# Patient Record
Sex: Female | Born: 1945
Health system: Southern US, Community
[De-identification: ages and names within clinical notes are randomized; demographics above are authoritative.]

## PROBLEM LIST (undated history)

## (undated) DIAGNOSIS — I10 Essential (primary) hypertension: Secondary | ICD-10-CM

## (undated) DIAGNOSIS — H3530 Unspecified macular degeneration: Secondary | ICD-10-CM

## (undated) DIAGNOSIS — E039 Hypothyroidism, unspecified: Secondary | ICD-10-CM

## (undated) DIAGNOSIS — K2960 Other gastritis without bleeding: Secondary | ICD-10-CM

## (undated) DIAGNOSIS — R413 Other amnesia: Secondary | ICD-10-CM

## (undated) DIAGNOSIS — N200 Calculus of kidney: Secondary | ICD-10-CM

## (undated) DIAGNOSIS — M8580 Other specified disorders of bone density and structure, unspecified site: Secondary | ICD-10-CM

## (undated) DIAGNOSIS — K760 Fatty (change of) liver, not elsewhere classified: Secondary | ICD-10-CM

## (undated) DIAGNOSIS — D1771 Benign lipomatous neoplasm of kidney: Secondary | ICD-10-CM

## (undated) DIAGNOSIS — E079 Disorder of thyroid, unspecified: Secondary | ICD-10-CM

## (undated) HISTORY — PX: ABDOMINAL HYSTERECTOMY: SHX81

## (undated) HISTORY — PX: TOTAL ABDOMINAL HYSTERECTOMY W/ BILATERAL SALPINGOOPHORECTOMY: SHX83

## (undated) HISTORY — DX: Disorder of thyroid, unspecified: E07.9

## (undated) HISTORY — DX: Unspecified macular degeneration: H35.30

## (undated) HISTORY — DX: Hypothyroidism, unspecified: E03.9

## (undated) HISTORY — DX: Other gastritis without bleeding: K29.60

## (undated) HISTORY — DX: Fatty (change of) liver, not elsewhere classified: K76.0

## (undated) HISTORY — PX: DIAGNOSTIC LAPAROSCOPY: SUR761

## (undated) HISTORY — DX: Benign lipomatous neoplasm of kidney: D17.71

## (undated) HISTORY — DX: Essential (primary) hypertension: I10

## (undated) HISTORY — DX: Other specified disorders of bone density and structure, unspecified site: M85.80

---

## 2000-04-05 ENCOUNTER — Other Ambulatory Visit: Admission: RE | Admit: 2000-04-05 | Discharge: 2000-04-05 | Payer: Self-pay | Admitting: Obstetrics and Gynecology

## 2001-02-27 ENCOUNTER — Ambulatory Visit (HOSPITAL_COMMUNITY): Admission: RE | Admit: 2001-02-27 | Discharge: 2001-02-27 | Payer: Self-pay | Admitting: Family Medicine

## 2001-02-27 ENCOUNTER — Encounter: Payer: Self-pay | Admitting: Family Medicine

## 2001-04-03 ENCOUNTER — Ambulatory Visit (HOSPITAL_COMMUNITY): Admission: RE | Admit: 2001-04-03 | Discharge: 2001-04-03 | Payer: Self-pay | Admitting: Family Medicine

## 2001-04-03 ENCOUNTER — Encounter: Payer: Self-pay | Admitting: Family Medicine

## 2001-09-24 ENCOUNTER — Ambulatory Visit (HOSPITAL_COMMUNITY): Admission: RE | Admit: 2001-09-24 | Discharge: 2001-09-24 | Payer: Self-pay | Admitting: Internal Medicine

## 2001-12-13 ENCOUNTER — Ambulatory Visit (HOSPITAL_COMMUNITY): Admission: RE | Admit: 2001-12-13 | Discharge: 2001-12-13 | Payer: Self-pay | Admitting: Family Medicine

## 2001-12-13 ENCOUNTER — Encounter: Payer: Self-pay | Admitting: Family Medicine

## 2005-10-05 ENCOUNTER — Ambulatory Visit (HOSPITAL_COMMUNITY): Admission: RE | Admit: 2005-10-05 | Discharge: 2005-10-05 | Payer: Self-pay | Admitting: Family Medicine

## 2009-11-11 ENCOUNTER — Ambulatory Visit (HOSPITAL_COMMUNITY)
Admission: RE | Admit: 2009-11-11 | Discharge: 2009-11-11 | Payer: Self-pay | Source: Home / Self Care | Admitting: Family Medicine

## 2010-03-07 ENCOUNTER — Encounter: Payer: Self-pay | Admitting: Orthopedic Surgery

## 2010-03-07 ENCOUNTER — Emergency Department (HOSPITAL_COMMUNITY)
Admission: EM | Admit: 2010-03-07 | Discharge: 2010-03-07 | Payer: Self-pay | Source: Home / Self Care | Admitting: Emergency Medicine

## 2010-03-09 ENCOUNTER — Ambulatory Visit: Payer: Self-pay | Admitting: Orthopedic Surgery

## 2010-03-09 DIAGNOSIS — S8263XA Displaced fracture of lateral malleolus of unspecified fibula, initial encounter for closed fracture: Secondary | ICD-10-CM

## 2010-04-13 ENCOUNTER — Ambulatory Visit: Payer: Self-pay | Admitting: Orthopedic Surgery

## 2010-04-14 ENCOUNTER — Encounter: Payer: Self-pay | Admitting: Orthopedic Surgery

## 2010-05-13 ENCOUNTER — Ambulatory Visit
Admission: RE | Admit: 2010-05-13 | Discharge: 2010-05-13 | Payer: Self-pay | Source: Home / Self Care | Attending: Orthopedic Surgery | Admitting: Orthopedic Surgery

## 2010-06-03 ENCOUNTER — Ambulatory Visit (HOSPITAL_COMMUNITY)
Admission: RE | Admit: 2010-06-03 | Discharge: 2010-06-03 | Payer: Self-pay | Source: Home / Self Care | Attending: Family Medicine | Admitting: Family Medicine

## 2010-06-07 ENCOUNTER — Ambulatory Visit (HOSPITAL_COMMUNITY)
Admission: RE | Admit: 2010-06-07 | Discharge: 2010-06-07 | Payer: Self-pay | Source: Home / Self Care | Attending: Family Medicine | Admitting: Family Medicine

## 2010-06-08 NOTE — Letter (Signed)
Summary: Physician's order for walker  Physician's order for walker   Imported By: Jacklynn Ganong 04/16/2010 09:12:59  _____________________________________________________________________  External Attachment:    Type:   Image     Comment:   External Document

## 2010-06-08 NOTE — Assessment & Plan Note (Signed)
Summary: 1 M RE-CK/XRAY OOP/CAST CHANGE/MEDICARE,MUT OMAHA/CAF   Visit Type:  Follow-up Referring Provider:  ap er Primary Provider:  Dr. Lilyan Punt  CC:  fx care ankle.  History of Present Illness: I saw Susan Mcneil in the office today for a visit.  She is a 65 years old woman with the complaint of:  left ankle.  DOI 03/07/10   Xrays [facility] left ankle 03/07/10 for review.  Meds: Percocet 5 as needed  HPI: This is a 65 year old female, who is going out  to get her morning paper slipped on the grass. Her foot went from under her and she twisted and injured her LEFT ankle, sustaining a Weber B. type nondisplaced fibular fracture with an intact mortise. She does have pain, swelling, and painful weightbearing.  Today is 4 week recheck with xrays OOP.  No complaints.   Allergies: No Known Drug Allergies   Foot/Ankle Exam  Skin:    Intact with no scars, lesions, rashes, cafe-au-lait spots or bruising.    Inspection:    Inspection reveals no deformity, ecchymosis or swelling.   Palpation:    tenderness L-lateral malleoulus: MINIMAL   Vascular:    dorsalis pedis and posterior tibial pulses 2+    Sensory:    gross sensation intact bilaterally in lower extremities.    Ankle Exam:    Left:    Inspection/Palpation:  DF = 10  PF = 20    Impression & Recommendations:  Problem # 1:  CLOSED FRACTURE OF LATERAL MALLEOLUS (ICD-824.2) Assessment Improved  Orders: Post-Op Check (16109) Ankle x-ray complete,  minimum 3 views (73610) LATERAL MALLEOLAR FRATURE IS SPIRAL AND NON DISPLACED  IMPRESSION: HEALING ND LAT MALL FRACTURE   Patient Instructions: 1)  AIR CAST  2)  WEIGHT BEARING AS TOLERATED  3)  XRAYS IN 4 WEEKS    Orders Added: 1)  Post-Op Check [99024] 2)  Ankle x-ray complete,  minimum 3 views [60454]

## 2010-06-08 NOTE — Letter (Signed)
Summary: Work [facility]  [address]  [place], [place] [address]   Phone: [phone]  Fax: [phone]    Today's Date: March 09, 2010  Name of Patient: Susan Mcneil  The above named patient had a medical visit today at: 330 / pm.  Please take this into consideration when reviewing the time away from work/school.    To be out of work for 2 months    Sincerely yours,   Fuller Canada MD

## 2010-06-08 NOTE — Assessment & Plan Note (Signed)
Summary: [facility] FX LEFT ANKLE XR THERE/MCR/MUT OF OMA./BSF   Vital Signs:  Patient profile:   65 year old female Height:      64 inches Weight:      172 pounds Pulse rate:   80 / minute Resp:     18 per minute  Vitals Entered By: Fuller Canada MD (March 09, 2010 3:18 PM)  Visit Type:  new patient Referring Provider:  ap er Primary Provider:  Dr. Lilyan Punt  CC:  left ankle pain.  History of Present Illness: I saw Susan Mcneil in the office today for an initial visit.  She is a 65 years old woman with the complaint of:  left ankle.  DOI 03/07/10 slid on ice and fell twisting her left ankle.  Xrays [facility] left ankle 03/07/10 for review.  Crutches and splint today.  Meds: Percocet 5, number 30 given from er.   This is a 65 year old female, who is employed an a to get her morning paper his step on the grass. Her foot went from under her and she twisted and injured her LEFT ankle, sustaining a Weber B. type nondisplaced fibular fracture with an intact mortise. She does have pain, swelling, and painful weightbearing.     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 Cardiovascular:  Denies chest pain, palpitations, fainting, and murmurs. Respiratory:  Denies short of breath, wheezing, couch, tightness, pain on inspiration, and snoring . Gastrointestinal:  Denies heartburn, nausea, vomiting, diarrhea, constipation, and blood in your stools. Genitourinary:  Denies frequency, urgency, difficulty urinating, painful urination, flank pain, and bleeding in urine. Neurologic:   Complains of numbness and tingling; denies unsteady gait, dizziness, tremors, and seizure. Musculoskeletal:  Complains of swelling; denies joint pain, instability, stiffness, redness, heat, and muscle pain. Endocrine:  Denies excessive thirst, exessive urination, and heat or cold intolerance. Psychiatric:  Denies nervousness, depression, anxiety, and hallucinations. Skin:  Complains of itching; denies changes in the skin, poor healing, rash, and redness. HEENT:  Denies blurred or double vision, eye pain, redness, and watering. Immunology:  Denies seasonal allergies, sinus problems, and allergic to bee stings. Hemoatologic:  Denies easy bleeding and brusing.  Physical Exam  Additional Exam:  GEN: well developed, well nourished, normal grooming and hygiene, no deformity and normal body habitus.   CDV: pulses are normal, no edema, no erythema. no tenderness  Lymph: normal lymph nodes   Skin: no rashes, skin lesions or open sores   NEURO: normal coordination, reflexes, sensation.   Psyche: awake, alert and oriented. Mood normal   Gait: crutches   Her ankle is very swollen and tender over the fibula. The foot is swelling as well. The skin is somewhat ecchymotic. She has painful range of motion, but I can get her foot to the neutral position. Her muscle tone is normal. Her ankle is stable.   Impression & Recommendations:  Problem # 1:  CLOSED FRACTURE OF LATERAL MALLEOLUS (ICD-824.2) Assessment New  The x-rays were done at [facility]. The report and the films have been reviewed.  There is a non displaced fracture of  the lateral malleolus   REC cast treatment and xray f/u   Orders: New Patient Level III (95621) Lat. Malleolus Fx (30865)  Patient Instructions: 1)  CAST x 1 month then XR OOP, cast change  2)  Please do not get the cast wet. It will casue a severe skin reaction. If you do get it wet, dry it with a hairdryer on a low setting and call the office. [the cast will need to be  changed]   Orders Added: 1)  New Patient Level III [99203] 2)  Lat. Malleolus Fx [78469]

## 2010-06-10 NOTE — Assessment & Plan Note (Signed)
Summary: 4 WK RE-CK/XRAYS LT ANKLE/MEDICARE/MUT OF OM/CAF   Visit Type:  Follow-up Referring Provider:  ap er Primary Provider:  Dr. Lilyan Punt  CC:  left ankle pain.  History of Present Illness: I saw Susan Mcneil in the office today for a 4 week  followup visit.  She is a 65 years old woman with the complaint of:  Problem # 1:  CLOSED FRACTURE OF LATERAL MALLEOLUS (ICD-824.2)  DOI 03/07/10   Xrays today.  Meds: Percocet 5 as needed  Treatment: Cast, changed to aircast, medication.  X-rays are obtained today and show that the fracture is healed  Clinical exam shows no tenderness at the fracture site adequate range of motion and no discomfort with rotation  Patient is advised to wean himself off the brace and she would like to be seen for an appointment regarding her knee pain     Allergies: No Known Drug Allergies   Impression & Recommendations:  Problem # 1:  CLOSED FRACTURE OF LATERAL MALLEOLUS (ICD-824.2) Assessment Improved  separate identifiable x-ray report  3 views of the LEFT ankle  Previously noted fibular fracture has healed in good position with normal mortise alignment  Impression healed fibula fracture LEFT ankle  Orders: Post-Op Check (16109) Ankle x-ray complete,  minimum 3 views ([address])  Patient Instructions: 1)  Wean off brace  2)  set up appt for her knees    Orders Added: 1)  Post-Op Check [99024] 2)  Ankle x-ray complete,  minimum 3 views [09811]

## 2010-06-22 ENCOUNTER — Ambulatory Visit: Payer: Self-pay | Admitting: Orthopedic Surgery

## 2010-07-26 ENCOUNTER — Encounter: Payer: Self-pay | Admitting: Orthopedic Surgery

## 2010-07-28 ENCOUNTER — Ambulatory Visit (INDEPENDENT_AMBULATORY_CARE_PROVIDER_SITE_OTHER): Payer: Medicare Other | Admitting: Orthopedic Surgery

## 2010-07-28 ENCOUNTER — Encounter: Payer: Self-pay | Admitting: Orthopedic Surgery

## 2010-07-28 VITALS — HR 74 | Resp 16 | Ht 64.0 in | Wt 163.0 lb

## 2010-07-28 DIAGNOSIS — M1712 Unilateral primary osteoarthritis, left knee: Secondary | ICD-10-CM

## 2010-07-28 MED ORDER — METHYLPREDNISOLONE ACETATE 40 MG/ML IJ SUSP: 40.0000 mg | Freq: Once | INTRAMUSCULAR | Status: AC

## 2010-07-28 NOTE — Progress Notes (Signed)
Chief complaint is LEFT knee pain.  Her pain started approximately 3 years ago came on gradually associated with no trauma. She describes no aching pain associated with some burning and swelling in the knee, especially when she is ambulating. Pain level is 3/10 pain seems to come and go is worse with exercise and walking. She does take some Aleve p.r.n. Basis and gets some relief. She had an injection about 12 years ago and seem to help. No treatment since that time.   Review of systems, no weight loss, blurred vision, chest pain or shortness of breath, no frequency, changes in the skin, nervousness, easy bleeding, excessive thirst or seasonal allergies.  Complaint of heartburn, numbness, tingling, or musculoskeletal reviewed is in the medical record above.  General: The patient is normally developed, with normal grooming and hygiene. There are no gross deformities. The body habitus is normal   CDV: The pulse and perfusion of the extremities are normal   LYMPH: There is no gross lymphadenopathy in the extremities   Skin: There are no rashes, ulcers or cafe-au-lait spot   Psyche: The patient is alert, awake and oriented.  Mood is normal   Neuro:  The coordination and balance are normal.  Sensation is normal. Reflexes are 2+ and equal   Musculoskeletal

## 2010-07-28 NOTE — Patient Instructions (Signed)
You have received a steroid shot. 15% of patients experience increased pain at the injection site with in the next 24 hours. This is best treated with ice and tylenol extra strength 2 tabs every 8 hours. If you are still having pain please call the office.   No walking next 2 days   Next 2 weeks take aleve daily then go back to as needed

## 2010-09-24 NOTE — Op Note (Signed)
[facility]  Patient:    Susan Mcneil, Susan Mcneil Visit Number: [id number] MRN: [id number]          Service Type: END Location: DAY Attending Physician:  Jonathon Bellows Dictated by:   Roetta Sessions, M.D. Proc. Date: 09/24/01 Admit Date:  09/24/2001 Discharge Date: 09/24/2001   CC:         Lilyan Punt, M.D.   Operative Report  PROCEDURE:  Screening colonoscopy.  INDICATIONS FOR PROCEDURE:  The patient is a 65 year old lady with a positive family history of colorectal neoplasia. She had a sigmoidoscopy and air contrast barium enema previously which demonstrated diverticulosis and questionable polypoid lesions in the left colon. She is essentially asymptomatic from a GI standpoint. Colonoscopy is now being done as a high risk screening maneuver given her family history of colorectal neoplasia. This approach has been discussed with the patient at length previously and again at bedside. The potential risks, benefits, and alternatives have been reviewed, questions answered. She is agreeable. Please see my dictated H&P for more information.  MONITORING:  O2 saturations, blood pressure, pulse and respirations were monitored throughout the entire procedure.  CONSCIOUS SEDATION:  Versed 3 mg IV, Demerol 50 mg IV in divided doses.  INSTRUMENT:  Olympus video chip colonoscope.  FINDINGS:  Digital rectal exam revealed no abnormalities.  ENDOSCOPIC FINDINGS:  The prep was good.  RECTUM:  Examination of the rectal mucosal including a retroflexed view of the anal verge revealed a couple of anal papilla and internal hemorrhoids. Otherwise normal rectum.  COLON:  The colonic mucosa was surveyed from the rectosigmoid junction through the left transverse right colon to the area of the appendiceal orifice, ileocecal valve, and cecum. These structures were well seen and photographed for the record. The patient had scattered left sided diverticula. The remainder of  the colon mucosa appeared normal.  From the level of the cecum and ileocecal valve, the scope was slowly withdrawn. All previously mentioned mucosal surfaces were again seen and no other abnormalities were observed. The patient tolerated the procedure well and was reacted in endoscopy.  IMPRESSION:  1. Anal papilla and internal hemorrhoids, otherwise, normal rectum.  2. Sigmoid diverticula, remainder of colonic mucosa appeared normal.  RECOMMENDATIONS:  1. Diverticulosis literature provided to [name].  2. Repeat colonoscopy in five years. Dictated by:   Roetta Sessions, M.D. Attending Physician:  Jonathon Bellows DD:  09/24/01 TD:  09/25/01 Job: [id number]

## 2011-05-18 DIAGNOSIS — H353290 Exudative age-related macular degeneration, unspecified eye, stage unspecified: Secondary | ICD-10-CM | POA: Diagnosis not present

## 2011-05-18 DIAGNOSIS — H35059 Retinal neovascularization, unspecified, unspecified eye: Secondary | ICD-10-CM | POA: Diagnosis not present

## 2011-06-22 DIAGNOSIS — H35059 Retinal neovascularization, unspecified, unspecified eye: Secondary | ICD-10-CM | POA: Diagnosis not present

## 2011-06-22 DIAGNOSIS — H353290 Exudative age-related macular degeneration, unspecified eye, stage unspecified: Secondary | ICD-10-CM | POA: Diagnosis not present

## 2011-07-05 ENCOUNTER — Ambulatory Visit (HOSPITAL_COMMUNITY): Admission: RE | Admit: 2011-07-05 | Payer: Medicare Other | Source: Ambulatory Visit

## 2011-07-05 ENCOUNTER — Ambulatory Visit (HOSPITAL_COMMUNITY)
Admission: RE | Admit: 2011-07-05 | Discharge: 2011-07-05 | Disposition: A | Discharge status: Home / Self Care | Payer: Medicare Other | Source: Ambulatory Visit | Attending: Family Medicine | Admitting: Family Medicine

## 2011-07-05 ENCOUNTER — Other Ambulatory Visit: Payer: Self-pay | Admitting: Family Medicine

## 2011-07-05 DIAGNOSIS — R6889 Other general symptoms and signs: Secondary | ICD-10-CM | POA: Diagnosis not present

## 2011-07-05 DIAGNOSIS — I700 Atherosclerosis of aorta: Secondary | ICD-10-CM | POA: Diagnosis not present

## 2011-07-05 DIAGNOSIS — Z0000 Encounter for general adult medical examination without abnormal findings: Secondary | ICD-10-CM | POA: Diagnosis not present

## 2011-07-05 DIAGNOSIS — R222 Localized swelling, mass and lump, trunk: Secondary | ICD-10-CM | POA: Insufficient documentation

## 2011-07-06 DIAGNOSIS — E039 Hypothyroidism, unspecified: Secondary | ICD-10-CM | POA: Diagnosis not present

## 2011-07-06 DIAGNOSIS — Z0000 Encounter for general adult medical examination without abnormal findings: Secondary | ICD-10-CM | POA: Diagnosis not present

## 2011-07-06 DIAGNOSIS — Z79899 Other long term (current) drug therapy: Secondary | ICD-10-CM | POA: Diagnosis not present

## 2011-07-12 DIAGNOSIS — Z1231 Encounter for screening mammogram for malignant neoplasm of breast: Secondary | ICD-10-CM | POA: Diagnosis not present

## 2011-07-27 DIAGNOSIS — H35059 Retinal neovascularization, unspecified, unspecified eye: Secondary | ICD-10-CM | POA: Diagnosis not present

## 2011-07-27 DIAGNOSIS — H353290 Exudative age-related macular degeneration, unspecified eye, stage unspecified: Secondary | ICD-10-CM | POA: Diagnosis not present

## 2011-08-31 DIAGNOSIS — H35059 Retinal neovascularization, unspecified, unspecified eye: Secondary | ICD-10-CM | POA: Diagnosis not present

## 2011-08-31 DIAGNOSIS — H353290 Exudative age-related macular degeneration, unspecified eye, stage unspecified: Secondary | ICD-10-CM | POA: Diagnosis not present

## 2011-10-04 DIAGNOSIS — H353290 Exudative age-related macular degeneration, unspecified eye, stage unspecified: Secondary | ICD-10-CM | POA: Diagnosis not present

## 2011-10-04 DIAGNOSIS — H35059 Retinal neovascularization, unspecified, unspecified eye: Secondary | ICD-10-CM | POA: Diagnosis not present

## 2011-11-07 DIAGNOSIS — H353290 Exudative age-related macular degeneration, unspecified eye, stage unspecified: Secondary | ICD-10-CM | POA: Diagnosis not present

## 2011-11-07 DIAGNOSIS — H35059 Retinal neovascularization, unspecified, unspecified eye: Secondary | ICD-10-CM | POA: Diagnosis not present

## 2011-11-09 DIAGNOSIS — H35059 Retinal neovascularization, unspecified, unspecified eye: Secondary | ICD-10-CM | POA: Diagnosis not present

## 2011-11-09 DIAGNOSIS — H35359 Cystoid macular degeneration, unspecified eye: Secondary | ICD-10-CM | POA: Diagnosis not present

## 2011-11-09 DIAGNOSIS — H353290 Exudative age-related macular degeneration, unspecified eye, stage unspecified: Secondary | ICD-10-CM | POA: Diagnosis not present

## 2011-12-19 DIAGNOSIS — H353290 Exudative age-related macular degeneration, unspecified eye, stage unspecified: Secondary | ICD-10-CM | POA: Diagnosis not present

## 2011-12-21 DIAGNOSIS — H353290 Exudative age-related macular degeneration, unspecified eye, stage unspecified: Secondary | ICD-10-CM | POA: Diagnosis not present

## 2012-01-23 DIAGNOSIS — H35059 Retinal neovascularization, unspecified, unspecified eye: Secondary | ICD-10-CM | POA: Diagnosis not present

## 2012-01-23 DIAGNOSIS — H353290 Exudative age-related macular degeneration, unspecified eye, stage unspecified: Secondary | ICD-10-CM | POA: Diagnosis not present

## 2012-01-25 DIAGNOSIS — H353290 Exudative age-related macular degeneration, unspecified eye, stage unspecified: Secondary | ICD-10-CM | POA: Diagnosis not present

## 2012-01-25 DIAGNOSIS — H35059 Retinal neovascularization, unspecified, unspecified eye: Secondary | ICD-10-CM | POA: Diagnosis not present

## 2012-02-27 DIAGNOSIS — H353290 Exudative age-related macular degeneration, unspecified eye, stage unspecified: Secondary | ICD-10-CM | POA: Diagnosis not present

## 2012-02-27 DIAGNOSIS — H35059 Retinal neovascularization, unspecified, unspecified eye: Secondary | ICD-10-CM | POA: Diagnosis not present

## 2012-02-29 DIAGNOSIS — H35059 Retinal neovascularization, unspecified, unspecified eye: Secondary | ICD-10-CM | POA: Diagnosis not present

## 2012-02-29 DIAGNOSIS — H353290 Exudative age-related macular degeneration, unspecified eye, stage unspecified: Secondary | ICD-10-CM | POA: Diagnosis not present

## 2012-04-02 DIAGNOSIS — H353290 Exudative age-related macular degeneration, unspecified eye, stage unspecified: Secondary | ICD-10-CM | POA: Diagnosis not present

## 2012-04-02 DIAGNOSIS — H35059 Retinal neovascularization, unspecified, unspecified eye: Secondary | ICD-10-CM | POA: Diagnosis not present

## 2012-04-04 DIAGNOSIS — H35059 Retinal neovascularization, unspecified, unspecified eye: Secondary | ICD-10-CM | POA: Diagnosis not present

## 2012-04-04 DIAGNOSIS — H353290 Exudative age-related macular degeneration, unspecified eye, stage unspecified: Secondary | ICD-10-CM | POA: Diagnosis not present

## 2012-04-04 DIAGNOSIS — H35359 Cystoid macular degeneration, unspecified eye: Secondary | ICD-10-CM | POA: Diagnosis not present

## 2012-04-04 DIAGNOSIS — H3570 Unspecified separation of retinal layers: Secondary | ICD-10-CM | POA: Diagnosis not present

## 2012-05-07 DIAGNOSIS — H35059 Retinal neovascularization, unspecified, unspecified eye: Secondary | ICD-10-CM | POA: Diagnosis not present

## 2012-05-07 DIAGNOSIS — H353290 Exudative age-related macular degeneration, unspecified eye, stage unspecified: Secondary | ICD-10-CM | POA: Diagnosis not present

## 2012-05-08 DIAGNOSIS — H353290 Exudative age-related macular degeneration, unspecified eye, stage unspecified: Secondary | ICD-10-CM | POA: Diagnosis not present

## 2012-05-08 DIAGNOSIS — H35059 Retinal neovascularization, unspecified, unspecified eye: Secondary | ICD-10-CM | POA: Diagnosis not present

## 2012-06-12 DIAGNOSIS — H35059 Retinal neovascularization, unspecified, unspecified eye: Secondary | ICD-10-CM | POA: Diagnosis not present

## 2012-06-12 DIAGNOSIS — H353290 Exudative age-related macular degeneration, unspecified eye, stage unspecified: Secondary | ICD-10-CM | POA: Diagnosis not present

## 2012-06-27 DIAGNOSIS — H353290 Exudative age-related macular degeneration, unspecified eye, stage unspecified: Secondary | ICD-10-CM | POA: Diagnosis not present

## 2012-06-27 DIAGNOSIS — H35359 Cystoid macular degeneration, unspecified eye: Secondary | ICD-10-CM | POA: Diagnosis not present

## 2012-06-28 DIAGNOSIS — K5732 Diverticulitis of large intestine without perforation or abscess without bleeding: Secondary | ICD-10-CM | POA: Diagnosis not present

## 2012-07-02 DIAGNOSIS — K5732 Diverticulitis of large intestine without perforation or abscess without bleeding: Secondary | ICD-10-CM | POA: Diagnosis not present

## 2012-07-03 DIAGNOSIS — E782 Mixed hyperlipidemia: Secondary | ICD-10-CM | POA: Diagnosis not present

## 2012-07-03 DIAGNOSIS — Z79899 Other long term (current) drug therapy: Secondary | ICD-10-CM | POA: Diagnosis not present

## 2012-07-03 DIAGNOSIS — E039 Hypothyroidism, unspecified: Secondary | ICD-10-CM | POA: Diagnosis not present

## 2012-07-06 ENCOUNTER — Other Ambulatory Visit (HOSPITAL_COMMUNITY): Payer: Self-pay | Admitting: Family Medicine

## 2012-07-06 DIAGNOSIS — M8580 Other specified disorders of bone density and structure, unspecified site: Secondary | ICD-10-CM

## 2012-07-06 DIAGNOSIS — Z9189 Other specified personal risk factors, not elsewhere classified: Secondary | ICD-10-CM | POA: Diagnosis not present

## 2012-07-06 DIAGNOSIS — Z0000 Encounter for general adult medical examination without abnormal findings: Secondary | ICD-10-CM | POA: Diagnosis not present

## 2012-07-11 ENCOUNTER — Ambulatory Visit (HOSPITAL_COMMUNITY)
Admission: RE | Admit: 2012-07-11 | Discharge: 2012-07-11 | Disposition: A | Discharge status: Home / Self Care | Payer: Medicare Other | Source: Ambulatory Visit | Attending: Family Medicine | Admitting: Family Medicine

## 2012-07-11 DIAGNOSIS — M899 Disorder of bone, unspecified: Secondary | ICD-10-CM | POA: Diagnosis not present

## 2012-07-11 DIAGNOSIS — Z780 Asymptomatic menopausal state: Secondary | ICD-10-CM | POA: Insufficient documentation

## 2012-07-11 DIAGNOSIS — M949 Disorder of cartilage, unspecified: Secondary | ICD-10-CM | POA: Insufficient documentation

## 2012-07-11 DIAGNOSIS — M8580 Other specified disorders of bone density and structure, unspecified site: Secondary | ICD-10-CM

## 2012-07-12 DIAGNOSIS — Z1231 Encounter for screening mammogram for malignant neoplasm of breast: Secondary | ICD-10-CM | POA: Diagnosis not present

## 2012-07-12 DIAGNOSIS — Z803 Family history of malignant neoplasm of breast: Secondary | ICD-10-CM | POA: Diagnosis not present

## 2012-07-14 ENCOUNTER — Encounter: Payer: Self-pay | Admitting: *Deleted

## 2012-08-01 DIAGNOSIS — H353290 Exudative age-related macular degeneration, unspecified eye, stage unspecified: Secondary | ICD-10-CM | POA: Diagnosis not present

## 2012-08-01 DIAGNOSIS — H35059 Retinal neovascularization, unspecified, unspecified eye: Secondary | ICD-10-CM | POA: Diagnosis not present

## 2012-08-08 DIAGNOSIS — H353290 Exudative age-related macular degeneration, unspecified eye, stage unspecified: Secondary | ICD-10-CM | POA: Diagnosis not present

## 2012-08-08 DIAGNOSIS — H35359 Cystoid macular degeneration, unspecified eye: Secondary | ICD-10-CM | POA: Diagnosis not present

## 2012-09-05 DIAGNOSIS — H35059 Retinal neovascularization, unspecified, unspecified eye: Secondary | ICD-10-CM | POA: Diagnosis not present

## 2012-09-05 DIAGNOSIS — H35359 Cystoid macular degeneration, unspecified eye: Secondary | ICD-10-CM | POA: Diagnosis not present

## 2012-09-05 DIAGNOSIS — H353290 Exudative age-related macular degeneration, unspecified eye, stage unspecified: Secondary | ICD-10-CM | POA: Diagnosis not present

## 2012-09-19 DIAGNOSIS — H35059 Retinal neovascularization, unspecified, unspecified eye: Secondary | ICD-10-CM | POA: Diagnosis not present

## 2012-09-19 DIAGNOSIS — H353290 Exudative age-related macular degeneration, unspecified eye, stage unspecified: Secondary | ICD-10-CM | POA: Diagnosis not present

## 2012-10-17 DIAGNOSIS — H35059 Retinal neovascularization, unspecified, unspecified eye: Secondary | ICD-10-CM | POA: Diagnosis not present

## 2012-10-17 DIAGNOSIS — H353290 Exudative age-related macular degeneration, unspecified eye, stage unspecified: Secondary | ICD-10-CM | POA: Diagnosis not present

## 2012-10-25 DIAGNOSIS — H353290 Exudative age-related macular degeneration, unspecified eye, stage unspecified: Secondary | ICD-10-CM | POA: Diagnosis not present

## 2012-10-25 DIAGNOSIS — H40009 Preglaucoma, unspecified, unspecified eye: Secondary | ICD-10-CM | POA: Diagnosis not present

## 2012-10-25 DIAGNOSIS — H40019 Open angle with borderline findings, low risk, unspecified eye: Secondary | ICD-10-CM | POA: Diagnosis not present

## 2012-10-25 DIAGNOSIS — H04129 Dry eye syndrome of unspecified lacrimal gland: Secondary | ICD-10-CM | POA: Diagnosis not present

## 2012-10-31 DIAGNOSIS — H35359 Cystoid macular degeneration, unspecified eye: Secondary | ICD-10-CM | POA: Diagnosis not present

## 2012-10-31 DIAGNOSIS — H35059 Retinal neovascularization, unspecified, unspecified eye: Secondary | ICD-10-CM | POA: Diagnosis not present

## 2012-10-31 DIAGNOSIS — H353290 Exudative age-related macular degeneration, unspecified eye, stage unspecified: Secondary | ICD-10-CM | POA: Diagnosis not present

## 2012-11-21 DIAGNOSIS — H353290 Exudative age-related macular degeneration, unspecified eye, stage unspecified: Secondary | ICD-10-CM | POA: Diagnosis not present

## 2012-11-21 DIAGNOSIS — H35059 Retinal neovascularization, unspecified, unspecified eye: Secondary | ICD-10-CM | POA: Diagnosis not present

## 2012-12-05 DIAGNOSIS — H35059 Retinal neovascularization, unspecified, unspecified eye: Secondary | ICD-10-CM | POA: Diagnosis not present

## 2012-12-05 DIAGNOSIS — H353290 Exudative age-related macular degeneration, unspecified eye, stage unspecified: Secondary | ICD-10-CM | POA: Diagnosis not present

## 2012-12-26 DIAGNOSIS — H35059 Retinal neovascularization, unspecified, unspecified eye: Secondary | ICD-10-CM | POA: Diagnosis not present

## 2012-12-26 DIAGNOSIS — H35359 Cystoid macular degeneration, unspecified eye: Secondary | ICD-10-CM | POA: Diagnosis not present

## 2012-12-26 DIAGNOSIS — H353290 Exudative age-related macular degeneration, unspecified eye, stage unspecified: Secondary | ICD-10-CM | POA: Diagnosis not present

## 2012-12-31 ENCOUNTER — Encounter: Payer: Self-pay | Admitting: Family Medicine

## 2012-12-31 ENCOUNTER — Ambulatory Visit (INDEPENDENT_AMBULATORY_CARE_PROVIDER_SITE_OTHER): Payer: Medicare Other | Admitting: Family Medicine

## 2012-12-31 VITALS — BP 142/94 | Ht 64.0 in | Wt 176.4 lb

## 2012-12-31 DIAGNOSIS — E785 Hyperlipidemia, unspecified: Secondary | ICD-10-CM

## 2012-12-31 DIAGNOSIS — R748 Abnormal levels of other serum enzymes: Secondary | ICD-10-CM

## 2012-12-31 DIAGNOSIS — E039 Hypothyroidism, unspecified: Secondary | ICD-10-CM | POA: Diagnosis not present

## 2012-12-31 NOTE — Progress Notes (Signed)
  Subjective:    Patient ID: Susan Mcneil, female    DOB: [birth year], 67 y.o.   MRN: [id number]  HPI Here for f/u appt from lab work. Believes cholesterol levels were high.  No other concerns. Patient denies any chest tightness pressure pain shortness of breath. She does try to eat healthy she does not do as much walking as she should she had been doing some bicycle riding She denies any wheezing headaches. She does not smoke.  Review of Systems See above.    Objective:   Physical Exam Lungs are clear hearts regular pulse normal abdomen soft extremities no edema skin warm dry       Assessment & Plan:  May need additional labs and u/s-if her liver enzymes are elevated she may need further workup. Lip/liv/tsh-she will do the lab work in may need adjustment of medication depending on the results patient followup again in 6 months

## 2013-01-01 DIAGNOSIS — E039 Hypothyroidism, unspecified: Secondary | ICD-10-CM | POA: Diagnosis not present

## 2013-01-01 DIAGNOSIS — E785 Hyperlipidemia, unspecified: Secondary | ICD-10-CM | POA: Diagnosis not present

## 2013-01-01 LAB — HEPATIC FUNCTION PANEL
AST: 25 U/L (ref 0–37)
Albumin: 4.4 g/dL (ref 3.5–5.2)
Alkaline Phosphatase: 70 U/L (ref 39–117)
Indirect Bilirubin: 0.5 mg/dL (ref 0.0–0.9)
Total Bilirubin: 0.6 mg/dL (ref 0.3–1.2)
Total Protein: 7.1 g/dL (ref 6.0–8.3)

## 2013-01-01 LAB — LIPID PANEL
HDL: 71 mg/dL (ref >39)
Triglycerides: 142 mg/dL (ref <150)

## 2013-01-02 ENCOUNTER — Other Ambulatory Visit: Payer: Self-pay

## 2013-01-02 MED ORDER — PRAVASTATIN SODIUM 80 MG PO TABS: 80.0000 mg | ORAL_TABLET | Freq: Every day | ORAL | Status: DC

## 2013-01-16 DIAGNOSIS — H35359 Cystoid macular degeneration, unspecified eye: Secondary | ICD-10-CM | POA: Diagnosis not present

## 2013-01-16 DIAGNOSIS — H35059 Retinal neovascularization, unspecified, unspecified eye: Secondary | ICD-10-CM | POA: Diagnosis not present

## 2013-01-16 DIAGNOSIS — H353290 Exudative age-related macular degeneration, unspecified eye, stage unspecified: Secondary | ICD-10-CM | POA: Diagnosis not present

## 2013-02-07 DIAGNOSIS — H353290 Exudative age-related macular degeneration, unspecified eye, stage unspecified: Secondary | ICD-10-CM | POA: Diagnosis not present

## 2013-02-07 DIAGNOSIS — H35059 Retinal neovascularization, unspecified, unspecified eye: Secondary | ICD-10-CM | POA: Diagnosis not present

## 2013-02-20 DIAGNOSIS — Z23 Encounter for immunization: Secondary | ICD-10-CM | POA: Diagnosis not present

## 2013-02-27 DIAGNOSIS — H353290 Exudative age-related macular degeneration, unspecified eye, stage unspecified: Secondary | ICD-10-CM | POA: Diagnosis not present

## 2013-02-27 DIAGNOSIS — H35059 Retinal neovascularization, unspecified, unspecified eye: Secondary | ICD-10-CM | POA: Diagnosis not present

## 2013-03-13 ENCOUNTER — Ambulatory Visit (INDEPENDENT_AMBULATORY_CARE_PROVIDER_SITE_OTHER): Payer: Medicare Other | Admitting: Family Medicine

## 2013-03-13 ENCOUNTER — Encounter: Payer: Self-pay | Admitting: Family Medicine

## 2013-03-13 VITALS — BP 130/82 | Ht 64.0 in | Wt 179.4 lb

## 2013-03-13 DIAGNOSIS — H35059 Retinal neovascularization, unspecified, unspecified eye: Secondary | ICD-10-CM | POA: Diagnosis not present

## 2013-03-13 DIAGNOSIS — Z0000 Encounter for general adult medical examination without abnormal findings: Secondary | ICD-10-CM | POA: Diagnosis not present

## 2013-03-13 DIAGNOSIS — R109 Unspecified abdominal pain: Secondary | ICD-10-CM | POA: Diagnosis not present

## 2013-03-13 DIAGNOSIS — H353290 Exudative age-related macular degeneration, unspecified eye, stage unspecified: Secondary | ICD-10-CM | POA: Diagnosis not present

## 2013-03-13 MED ORDER — METRONIDAZOLE 500 MG PO TABS: 500.0000 mg | ORAL_TABLET | Freq: Three times a day (TID) | ORAL | Status: AC

## 2013-03-13 MED ORDER — CIPROFLOXACIN HCL 500 MG PO TABS: 500.0000 mg | ORAL_TABLET | Freq: Two times a day (BID) | ORAL | Status: DC

## 2013-03-13 NOTE — Progress Notes (Signed)
  Subjective:    Patient ID: Susan Mcneil, female    DOB: 1945/07/26, 67 y.o.   MRN: [id number]  HPI  Patient with complaint of left abd pain for few days-history of diverticulitis. Patient has history of diverticulitis she complains of left lower abdominal pain discomfort been present for the past couple days no vomiting with it no sweats chills nausea vomiting diarrhea wheezing. No dysuria. PMH benign. Family history noncontributory Review of Systems See above. Patient is up-to-date on colonoscopy. Also patient had a CAT scan several years ago that showed multiple diverticuli in the sigmoid colon   denies fevers denies sweats denies cough wheezing difficulty breathing no arthralgias Objective:   Physical Exam  Her lungs are clear hearts regular abdomen is soft with mild tenderness in the left lower abdomen no guarding or rebound extremities no edema      Assessment & Plan:  Significant left lower quadrant abdominal pain-differential was gone through. More than likely diverticulitis. Cipro twice a day for 10 days Flagyl 3 times a day for 10 days probiotic as well progressive symptoms such as vomiting severe pain rectal bleeding or other issues need to followup immediately or call us may need CT scan Hemoccult cards x3 should be done in 2-3 weeks and sent back to [place]. I don't feel this patient needs colonoscopy currently. High complexity decision making regarding possibility of a CAT scan versus ER versus outpatient management

## 2013-03-20 ENCOUNTER — Other Ambulatory Visit (INDEPENDENT_AMBULATORY_CARE_PROVIDER_SITE_OTHER): Payer: Medicare Other | Admitting: *Deleted

## 2013-03-20 DIAGNOSIS — R109 Unspecified abdominal pain: Secondary | ICD-10-CM

## 2013-03-20 DIAGNOSIS — Z1211 Encounter for screening for malignant neoplasm of colon: Secondary | ICD-10-CM

## 2013-03-20 DIAGNOSIS — Z0000 Encounter for general adult medical examination without abnormal findings: Secondary | ICD-10-CM

## 2013-03-20 LAB — POC HEMOCCULT BLD/STL (HOME/3-CARD/SCREEN)
Card #3 Fecal Occult Blood, POC: NEGATIVE
Fecal Occult Blood, POC: NEGATIVE

## 2013-03-21 ENCOUNTER — Encounter: Payer: Self-pay | Admitting: Family Medicine

## 2013-04-10 DIAGNOSIS — H35059 Retinal neovascularization, unspecified, unspecified eye: Secondary | ICD-10-CM | POA: Diagnosis not present

## 2013-04-10 DIAGNOSIS — H353290 Exudative age-related macular degeneration, unspecified eye, stage unspecified: Secondary | ICD-10-CM | POA: Diagnosis not present

## 2013-04-17 DIAGNOSIS — H35059 Retinal neovascularization, unspecified, unspecified eye: Secondary | ICD-10-CM | POA: Diagnosis not present

## 2013-04-17 DIAGNOSIS — H35359 Cystoid macular degeneration, unspecified eye: Secondary | ICD-10-CM | POA: Diagnosis not present

## 2013-04-17 DIAGNOSIS — H353290 Exudative age-related macular degeneration, unspecified eye, stage unspecified: Secondary | ICD-10-CM | POA: Diagnosis not present

## 2013-05-01 ENCOUNTER — Ambulatory Visit (INDEPENDENT_AMBULATORY_CARE_PROVIDER_SITE_OTHER): Payer: Medicare Other | Admitting: Family Medicine

## 2013-05-01 ENCOUNTER — Encounter: Payer: Self-pay | Admitting: Family Medicine

## 2013-05-01 VITALS — BP 130/82 | Ht 64.0 in | Wt 180.0 lb

## 2013-05-01 DIAGNOSIS — R748 Abnormal levels of other serum enzymes: Secondary | ICD-10-CM

## 2013-05-01 DIAGNOSIS — R413 Other amnesia: Secondary | ICD-10-CM

## 2013-05-01 DIAGNOSIS — R4182 Altered mental status, unspecified: Secondary | ICD-10-CM | POA: Diagnosis not present

## 2013-05-01 DIAGNOSIS — E785 Hyperlipidemia, unspecified: Secondary | ICD-10-CM

## 2013-05-01 DIAGNOSIS — E039 Hypothyroidism, unspecified: Secondary | ICD-10-CM

## 2013-05-01 DIAGNOSIS — I881 Chronic lymphadenitis, except mesenteric: Secondary | ICD-10-CM

## 2013-05-01 NOTE — Progress Notes (Signed)
   Subjective:    Patient ID: Susan Mcneil, female    DOB: January 22, 1946, 67 y.o.   MRN: [id number]  HPI Patient arrives with forgetfulness at times since Thanksgiving. Started and OTC med that helps with memory but it hasn't helped. Forgets to do things at times. Oriented X 3. First occurrence of this issue. On Dec 7 had altered mental state for 1 minute. She relates that for about a minute she felt like she couldn't think properly everything seemed disoriented to her plus also she didn't feel right she even felt like there might be moving slightly but she denies vertigo. She denies any unilateral numbness or weakness. She's not had any spells of aphasia of unilateral numbness or weakness. Over past few weeks with memory issues. Some spells of misplacing items. Occasionally repeating herself. Some forgeting of things told to her No family issues. No stresses. No stroke symtoms other than what is above.   Review of Systems She denies dizziness nausea vomiting headache. Denies chest pain shortness of breath.    Objective:   Physical Exam  Vitals reviewed. Constitutional: She appears well-nourished. No distress.  HENT:  Eardrums normal throat is normal neck is supple no masses patient's pupils are equal EOMI facial nerves normal bilateral.  Cardiovascular: Normal rate, regular rhythm and normal heart sounds.   No murmur heard. Pulmonary/Chest: Effort normal and breath sounds normal. No respiratory distress.  Musculoskeletal: She exhibits no edema.  Lymphadenopathy:    She has no cervical adenopathy.  Neurological: She is alert. She exhibits normal muscle tone.  Finger to nose normal negative Romberg. Patient also passed many cognitive tests. Also scored 28/30 of the Mini-Mental Status exam.  Psychiatric: Her behavior is normal.          Assessment & Plan:  Altered mental status-with this brief spell that she had it is concerning that she may have had a TIA. I am concerned with this  patient's sudden onset of memory issues over the past month and a half if there could be the residuals of a stroke. I believe it would be wise for this patient to undergo carotid Doppler studies and MRI. May need neurologic consultation depending on the findings. Also stroke factors and risk modification such as blood pressure cholesterol was discussed start 81 mg aspirin daily until work up completed followup again in one month's time 30 minutes spent with patient

## 2013-05-10 ENCOUNTER — Ambulatory Visit (HOSPITAL_COMMUNITY)
Admission: RE | Admit: 2013-05-10 | Discharge: 2013-05-10 | Disposition: A | Discharge status: Home / Self Care | Payer: Medicare Other | Source: Ambulatory Visit | Attending: Family Medicine | Admitting: Family Medicine

## 2013-05-10 DIAGNOSIS — G454 Transient global amnesia: Secondary | ICD-10-CM | POA: Insufficient documentation

## 2013-05-10 DIAGNOSIS — I10 Essential (primary) hypertension: Secondary | ICD-10-CM | POA: Insufficient documentation

## 2013-05-10 DIAGNOSIS — R413 Other amnesia: Secondary | ICD-10-CM | POA: Diagnosis not present

## 2013-05-10 DIAGNOSIS — I6789 Other cerebrovascular disease: Secondary | ICD-10-CM | POA: Insufficient documentation

## 2013-05-10 DIAGNOSIS — R4182 Altered mental status, unspecified: Secondary | ICD-10-CM

## 2013-05-10 DIAGNOSIS — G319 Degenerative disease of nervous system, unspecified: Secondary | ICD-10-CM | POA: Insufficient documentation

## 2013-05-14 ENCOUNTER — Other Ambulatory Visit: Payer: Self-pay | Admitting: Family Medicine

## 2013-05-14 DIAGNOSIS — E785 Hyperlipidemia, unspecified: Secondary | ICD-10-CM | POA: Diagnosis not present

## 2013-05-14 DIAGNOSIS — R748 Abnormal levels of other serum enzymes: Secondary | ICD-10-CM | POA: Diagnosis not present

## 2013-05-14 DIAGNOSIS — E039 Hypothyroidism, unspecified: Secondary | ICD-10-CM | POA: Diagnosis not present

## 2013-05-14 DIAGNOSIS — R413 Other amnesia: Secondary | ICD-10-CM

## 2013-05-14 NOTE — Addendum Note (Signed)
Addended byCharolotte Capuchin D on: 05/14/2013 05:22 PM   Modules accepted: Orders

## 2013-05-14 NOTE — Progress Notes (Signed)
BW orders in and ready for her to pick up

## 2013-05-16 ENCOUNTER — Other Ambulatory Visit: Payer: Self-pay | Admitting: Family Medicine

## 2013-05-16 DIAGNOSIS — R4182 Altered mental status, unspecified: Secondary | ICD-10-CM | POA: Diagnosis not present

## 2013-05-16 DIAGNOSIS — D539 Nutritional anemia, unspecified: Secondary | ICD-10-CM | POA: Diagnosis not present

## 2013-05-16 LAB — HEPATIC FUNCTION PANEL
ALK PHOS: 74 U/L (ref 39–117)
ALT: 20 U/L (ref 0–35)
AST: 24 U/L (ref 0–37)
Albumin: 4.1 g/dL (ref 3.5–5.2)
BILIRUBIN INDIRECT: 0.4 mg/dL (ref 0.0–0.9)
Bilirubin, Direct: 0.1 mg/dL (ref 0.0–0.3)
TOTAL PROTEIN: 6.9 g/dL (ref 6.0–8.3)
Total Bilirubin: 0.5 mg/dL (ref 0.3–1.2)

## 2013-05-16 LAB — LIPID PANEL
Cholesterol: 206 mg/dL — ABNORMAL HIGH (ref 0–200)
HDL: 61 mg/dL (ref >39)
LDL Cholesterol: 120 mg/dL — ABNORMAL HIGH (ref 0–99)
TRIGLYCERIDES: 124 mg/dL (ref <150)
Total CHOL/HDL Ratio: 3.4 Ratio
VLDL: 25 mg/dL (ref 0–40)

## 2013-05-17 LAB — VITAMIN B12: Vitamin B-12: 256 pg/mL (ref 211–911)

## 2013-05-17 LAB — T4, FREE: FREE T4: 1.34 ng/dL (ref 0.80–1.80)

## 2013-05-17 LAB — TSH: TSH: 4.222 u[IU]/mL (ref 0.350–4.500)

## 2013-05-17 NOTE — Progress Notes (Signed)
Patient notified and verbalized understanding of results. (Added lab work.)

## 2013-05-21 ENCOUNTER — Encounter: Payer: Self-pay | Admitting: Family Medicine

## 2013-05-21 LAB — METHYLMALONIC ACID, SERUM: Methylmalonic Acid, Quant: 0.29 umol/L (ref <0.40)

## 2013-06-04 ENCOUNTER — Encounter: Payer: Self-pay | Admitting: Family Medicine

## 2013-06-04 ENCOUNTER — Ambulatory Visit (INDEPENDENT_AMBULATORY_CARE_PROVIDER_SITE_OTHER): Payer: Medicare Other | Admitting: Family Medicine

## 2013-06-04 VITALS — BP 132/80 | Ht 64.0 in | Wt 182.8 lb

## 2013-06-04 DIAGNOSIS — E039 Hypothyroidism, unspecified: Secondary | ICD-10-CM | POA: Diagnosis not present

## 2013-06-04 DIAGNOSIS — E785 Hyperlipidemia, unspecified: Secondary | ICD-10-CM

## 2013-06-04 DIAGNOSIS — R413 Other amnesia: Secondary | ICD-10-CM | POA: Diagnosis not present

## 2013-06-04 MED ORDER — TRIAMCINOLONE ACETONIDE 0.1 % EX CREA: 1.0000 "application " | TOPICAL_CREAM | Freq: Two times a day (BID) | CUTANEOUS | Status: DC

## 2013-06-04 NOTE — Progress Notes (Signed)
   Subjective:    Patient ID: Susan Mcneil, female    DOB: [birth year], 68 y.o.   MRN: [id number]  HPI  Patient arrives for a follow up on her memory. Patient states the issue is about the same and has not gotten worse. Patient has not had any unusual memory issues recently she's seen a neurologist in early February. She is also hopeful that things will go well.  She does take her cholesterol medicine she does try to watch her diet. She does not need medication adjustment or change in therapy at this present moment we will need to recheck lab work later this spring/early summer.  Patient's recent testing shows thyroid function good continue it where it is  Patient's B12 level is low normal she is to take an additional supplement. Her methylmalonic acid level is normal so therefore she doesn't have true B12 deficiency. More than likely we will repeat B12 level in several months time  Patient also reports rash on legs for 3 days. She does relate a papular rash on her legs does not itch just started up a few days ago no new medicines or soaps.  Review of Systems Denies fever chills vomiting cough. States memory is stable.    Objective:   Physical Exam Lungs are clear hearts regular Papular rash noted on the legs Patient alert and interactive        Assessment & Plan:  #1 nonspecific rash possibly related to dry skin use lotion on a regular basis Kenalog cream twice a day when necessary followup if ongoing troubles  #2 hypothyroidism on current dose stick with where it is.  #3 low normal B12 take B12 supplement as directed  #4 memory issues will be seeing neurologist in February.  Patient will followup here in approximate 6 months  Continue statin I doubt that is causing her memory issue

## 2013-06-05 DIAGNOSIS — H35059 Retinal neovascularization, unspecified, unspecified eye: Secondary | ICD-10-CM | POA: Diagnosis not present

## 2013-06-05 DIAGNOSIS — H353290 Exudative age-related macular degeneration, unspecified eye, stage unspecified: Secondary | ICD-10-CM | POA: Diagnosis not present

## 2013-06-07 DIAGNOSIS — H40009 Preglaucoma, unspecified, unspecified eye: Secondary | ICD-10-CM | POA: Diagnosis not present

## 2013-06-10 DIAGNOSIS — H353290 Exudative age-related macular degeneration, unspecified eye, stage unspecified: Secondary | ICD-10-CM | POA: Diagnosis not present

## 2013-06-10 DIAGNOSIS — H35059 Retinal neovascularization, unspecified, unspecified eye: Secondary | ICD-10-CM | POA: Diagnosis not present

## 2013-06-10 DIAGNOSIS — H35359 Cystoid macular degeneration, unspecified eye: Secondary | ICD-10-CM | POA: Diagnosis not present

## 2013-06-17 ENCOUNTER — Other Ambulatory Visit (HOSPITAL_COMMUNITY): Payer: Self-pay | Admitting: Pediatrics

## 2013-06-17 ENCOUNTER — Encounter: Payer: Self-pay | Admitting: Neurology

## 2013-06-17 ENCOUNTER — Ambulatory Visit (INDEPENDENT_AMBULATORY_CARE_PROVIDER_SITE_OTHER): Payer: Medicare Other | Admitting: Neurology

## 2013-06-17 VITALS — BP 140/82 | HR 70 | Temp 98.4°F | Resp 18 | Ht 64.0 in | Wt 179.7 lb

## 2013-06-17 DIAGNOSIS — R413 Other amnesia: Secondary | ICD-10-CM | POA: Diagnosis not present

## 2013-06-17 DIAGNOSIS — R404 Transient alteration of awareness: Secondary | ICD-10-CM | POA: Diagnosis not present

## 2013-06-17 NOTE — Patient Instructions (Addendum)
It is unclear what exactly happened in December.  Maybe it was a transient ischemic attack, which is a mini-stroke. 1.  Start aspirin 81mg  daily. 2.  We will get an EEG, which records the brain waves looking for evidence of increased risk for seizures. 3.  Continue B12 4.  Follow up in 6 months for re-evaluation of memory.  You are scheduled at [facility] on February 19, at 230 pm for your EEG, please arrive at 215 pm for registration. Please go to main entrance at [facility] [address].

## 2013-06-17 NOTE — Progress Notes (Signed)
NEUROLOGY CONSULTATION NOTE  Susan Mcneil MRN: [id number] DOB: 1945-10-11  Referring provider: Dr. Wolfgang Phoenix Primary care provider: Dr. Wolfgang Phoenix  Reason for consult:  Transient altered sensorium  HISTORY OF PRESENT ILLNESS: Susan Mcneil is a 67 year old right-handed woman with history of hypothyroidism and hyperlipidemia who presents for memory problems.  She is accompanied by her daughter.  Records and images were personally reviewed where available.    On 04/14/13, she had a brief episode of altered sensorium.  She was at a family holiday party.  She was standing, when suddenly she felt strange, "like an out of body experience."  It lasted maybe a couple of minutes.  She did not lose consciousness, feel dizzy, have a headache, or experience focal twitching or numbness.  The episode quickly passed, unbeknownst to anybody else.  She has never had an episode like that before or since.  She has been at baseline every since.  She also has some memory issues, but nothing significant or anything worrisome to the patient or her family.  She will sometimes forget some content of conversation she had with others.  She does not misplace items.  She drives without difficulty.  Her daughter feels safe in the car when she is driving.  She pays bills without any problems.    Her B12 level was in the low-normal range and she has started oral supplementation.    She has no history of seizures, migraine or stroke.  There is no family history of dementia.  05/16/13 LABS:  B12 258, methylmalonic acid 0.29, TSH 4.222, Chol 206, HDL 61, LDL 120  05/14/13 MRI BRAIN WO:  mild to moderate cerebral and cerebellar atrophy with chronic small vessel disease.  Moderately advanced dolichoectasia of intracranial vasculature, prominently involving left vertebral.  05/10/13 CAROTID US:  no hemodynamically significant ICA stenosis.  Both vertebrals with normal flow.  PAST MEDICAL HISTORY: Past Medical History  Diagnosis Date    . HTN (hypertension)     cholesterol & thyroid  . Macular degeneration   . Reflux gastritis   . Thyroid disorder   . Osteoporosis   . Hypothyroidism   . Fatty liver   . Osteopenia     PAST SURGICAL HISTORY: Past Surgical History  Procedure Laterality Date  . Total abdominal hysterectomy w/ bilateral salpingoophorectomy    . Diagnostic laparoscopy    . Abdominal hysterectomy   patial, pre-uterine cancer, 1974    MEDICATIONS: Current Outpatient Prescriptions on File Prior to Visit  Medication Sig Dispense Refill  . Calcium Citrate (CITRACAL PO) Take 2 tablets by mouth daily.      . Coenzyme Q10 (COQ10 PO) Take by mouth daily.      Marland Kitchen levothyroxine (SYNTHROID, LEVOTHROID) 100 MCG tablet Take 100 mcg by mouth daily.        . Multiple Vitamins-Minerals ([facility] MACULAR HEALTH PO) Take 1 tablet by mouth daily after lunch.      . pravastatin (PRAVACHOL) 80 MG tablet Take 1 tablet (80 mg total) by mouth daily.  30 tablet  6  . triamcinolone cream (KENALOG) 0.1 % Apply 1 application topically 2 (two) times daily.  45 g  4   No current facility-administered medications on file prior to visit.    ALLERGIES: No Known Allergies  FAMILY HISTORY: Family History  Problem Relation Age of Onset  . Cancer Mother   . Stroke Father     SOCIAL HISTORY: History   Social History  . Marital Status: Married  Spouse Name: N/A    Number of Children: N/A  . Years of Education: 12   Occupational History  . retired   .     Social History Main Topics  . Smoking status: Former Smoker    Quit date: 12/07/2000  . Smokeless tobacco: Not on file  . Alcohol Use: No  . Drug Use: Not on file  . Sexual Activity: Not on file   Other Topics Concern  . Not on file   Social History Narrative  . No narrative on file    REVIEW OF SYSTEMS: Constitutional: No fevers, chills, or sweats, no generalized fatigue, change in appetite Eyes: No visual changes, double vision, eye pain Ear, nose and  throat: No hearing loss, ear pain, nasal congestion, sore throat Cardiovascular: No chest pain, palpitations Respiratory:  No shortness of breath at rest or with exertion, wheezes GastrointestinaI: No nausea, vomiting, diarrhea, abdominal pain, fecal incontinence Genitourinary:  No dysuria, urinary retention or frequency Musculoskeletal:  No neck pain, back pain Integumentary: rash Neurological: as above Psychiatric: No depression, insomnia, anxiety Endocrine: No palpitations, fatigue, diaphoresis, mood swings, change in appetite, change in weight, increased thirst Hematologic/Lymphatic:  No anemia, purpura, petechiae. Allergic/Immunologic: no itchy/runny eyes, nasal congestion, recent allergic reactions, rashes  PHYSICAL EXAM: Filed Vitals:   06/17/13 1257  BP: 140/82  Pulse: 70  Temp: 98.4 F (36.9 C)  Resp: 18   General: No acute distress.  She feels a little anxious Head:  Normocephalic/atraumatic Neck: supple, no paraspinal tenderness, full range of motion Back: No paraspinal tenderness Heart: regular rate and rhythm Lungs: Clear to auscultation bilaterally. Vascular: No carotid bruits. Neurological Exam: Mental status: alert and oriented to person, place, and time, speech fluent and not dysarthric, language intact.  Able to spell WORLD backwards.  Recalled 1 of 3 words after a couple of minutes.  Able to follow 3 step command across midline.  Able to copy intersecting pentagons.  Able to draw a clock except unable to draw the hands to the requested time.  MMSE 28/30. Cranial nerves: CN I: not tested CN II: pupils equal, round and reactive to light, visual fields intact, fundi unremarkable. CN III, IV, VI:  full range of motion, no nystagmus, no ptosis CN V: facial sensation intact CN VII: upper and lower face symmetric CN VIII: hearing intact CN IX, X: gag intact, uvula midline CN XI: sternocleidomastoid and trapezius muscles intact CN XII: tongue midline Bulk & Tone:  normal, no fasciculations. Motor: 5/5 throughout Sensation: temperature and vibration intact. Deep Tendon Reflexes: 2+ throughout, toes down Finger to nose testing: no dysmetria or tremor. Heel to shin: No dysmetria Gait: normal stance and stride.  Able to walk in tandem. Romberg negative.  IMPRESSION: 1.  Transient altered sensorium.  Etiology unclear.  Differential includes TIA, seizure or migraine.  She does not have migraine.  Possibly TIA, although it is not localizable. 2.  Mild short-term memory problems.  Not really a concern to patient or daughter.  Had some difficulty with delayed recall and did not correctly place the hands on the clock to requested time.  May be related to anxiety.  Would monitor.  PLAN: 1.  I would start ASA 81mg  daily  2.  We will check an EEG 3.  Continue B12 supplementation 4.  Follow up in 6 months for re-evaluation of memory.  45 minutes spent with patient, over 50% spent counseling and coordinating care.  Thank you for allowing me to take part in the care  of this patient.  Metta Clines, DO  CC:  Sallee Lange, MD

## 2013-06-27 ENCOUNTER — Ambulatory Visit (HOSPITAL_COMMUNITY)
Admission: RE | Admit: 2013-06-27 | Discharge: 2013-06-27 | Disposition: A | Discharge status: Home / Self Care | Payer: Medicare Other | Source: Ambulatory Visit | Attending: Neurology | Admitting: Neurology

## 2013-06-27 DIAGNOSIS — R413 Other amnesia: Secondary | ICD-10-CM | POA: Diagnosis not present

## 2013-06-27 DIAGNOSIS — R4182 Altered mental status, unspecified: Secondary | ICD-10-CM | POA: Insufficient documentation

## 2013-06-27 DIAGNOSIS — R404 Transient alteration of awareness: Secondary | ICD-10-CM

## 2013-06-27 NOTE — Progress Notes (Signed)
OP routine adult EEG completed.

## 2013-07-05 ENCOUNTER — Encounter: Payer: Self-pay | Admitting: Family Medicine

## 2013-07-05 ENCOUNTER — Ambulatory Visit (INDEPENDENT_AMBULATORY_CARE_PROVIDER_SITE_OTHER): Payer: Medicare Other | Admitting: Family Medicine

## 2013-07-05 VITALS — BP 128/80 | Ht 64.0 in | Wt 180.8 lb

## 2013-07-05 DIAGNOSIS — R21 Rash and other nonspecific skin eruption: Secondary | ICD-10-CM

## 2013-07-05 LAB — CBC WITH DIFFERENTIAL/PLATELET
Basophils Absolute: 0 10*3/uL (ref 0.0–0.1)
Basophils Relative: 0 % (ref 0–1)
EOS ABS: 0.2 10*3/uL (ref 0.0–0.7)
Eosinophils Relative: 2 % (ref 0–5)
HCT: 42.5 % (ref 36.0–46.0)
Hemoglobin: 14.7 g/dL (ref 12.0–15.0)
Lymphocytes Relative: 38 % (ref 12–46)
Lymphs Abs: 3 10*3/uL (ref 0.7–4.0)
MCH: 30 pg (ref 26.0–34.0)
MCHC: 34.6 g/dL (ref 30.0–36.0)
MCV: 86.7 fL (ref 78.0–100.0)
MONOS PCT: 7 % (ref 3–12)
Monocytes Absolute: 0.6 10*3/uL (ref 0.1–1.0)
NEUTROS ABS: 4.2 10*3/uL (ref 1.7–7.7)
Neutrophils Relative %: 53 % (ref 43–77)
Platelets: 258 10*3/uL (ref 150–400)
RBC: 4.9 MIL/uL (ref 3.87–5.11)
RDW: 14.6 % (ref 11.5–15.5)
WBC: 8 10*3/uL (ref 4.0–10.5)

## 2013-07-05 NOTE — Progress Notes (Signed)
   Subjective:    Patient ID: Susan Mcneil, female    DOB: [birth year], 68 y.o.   MRN: [id number]  HPI Patient arrives for a rash on both legs for a few days. Patient reports nothing has changed and nothing is new. This rash started off as small red dots on the lower leg and progressively moved up the leg to about the level of her knee. She also relates that the rash is also in the arms.  She denies any myalgias she denies fever sweats chills wheezing difficulty breathing she states no hives. No new foods no new medicines or supplements no prior history of this  Review of Systems No fevers no hives no itching no sweats chills or weight loss    Objective:   Physical Exam Nonspecific rash does not blanch small red spots slight papular nature more on the legs some on the arms none on the abdomen or back lungs are clear hearts regular       Assessment & Plan:  Nonspecific rash could be drug related rash stop pravastatin if not doing better over the next 2 weeks notify us and we will work with getting dermatology opinion  If it goes away with stopping the pravastatin and she'll need to be on a different cholesterol medicine  She is following up with neurology in 6 months still awaiting EEG results

## 2013-07-05 NOTE — Patient Instructions (Signed)
Continue B12 take 2000 mcg daily  Recheck B12 in 5 to 6 months  Stop Pravastatin , please let me know if rash doesn't go away over the next 2 weeks, if rash causes other issues let us know  Also is rash doesn't go away over the next 2 weeks then we will need to get Derm involved  Also if rash goes away we will need to try different cholesterol med, please give [place] rash update ( call ) in about 2 weeks

## 2013-07-06 LAB — SEDIMENTATION RATE: Sed Rate: 10 mm/hr (ref 0–22)

## 2013-07-08 ENCOUNTER — Encounter: Payer: Self-pay | Admitting: Family Medicine

## 2013-07-08 LAB — ANA: Anti Nuclear Antibody(ANA): NEGATIVE

## 2013-07-09 ENCOUNTER — Other Ambulatory Visit: Payer: Self-pay | Admitting: Family Medicine

## 2013-07-10 DIAGNOSIS — H353290 Exudative age-related macular degeneration, unspecified eye, stage unspecified: Secondary | ICD-10-CM | POA: Diagnosis not present

## 2013-07-10 DIAGNOSIS — H35059 Retinal neovascularization, unspecified, unspecified eye: Secondary | ICD-10-CM | POA: Diagnosis not present

## 2013-07-19 ENCOUNTER — Telehealth: Payer: Self-pay | Admitting: Family Medicine

## 2013-07-19 ENCOUNTER — Other Ambulatory Visit: Payer: Self-pay | Admitting: Family Medicine

## 2013-07-19 DIAGNOSIS — R21 Rash and other nonspecific skin eruption: Secondary | ICD-10-CM

## 2013-07-19 NOTE — Telephone Encounter (Signed)
Please do a referral for Dermatologist.  Still has rash and you told her if it was still present after 2 weeks to let you know to do referral. Her contact number is [phone].

## 2013-07-19 NOTE — Telephone Encounter (Signed)
Notified patient referral was put in today, brendale to do. Patient verbalized understanding.

## 2013-07-19 NOTE — Telephone Encounter (Signed)
Referral was put in today, brendale to do and notify pt, NTC with this message

## 2013-07-22 NOTE — Addendum Note (Signed)
Encounter addended by: Dudley Major, DO on: 07/22/2013 11:00 AM<BR>     Documentation filed: Clinical Notes

## 2013-07-22 NOTE — Procedures (Signed)
ELECTROENCEPHALOGRAM REPORT  Date of Study: 06/27/2013  Patient's Name: TEXAS OBORN MRN: [id number] Date of Birth: [birth year]  Indication: Altered sensorium ("out of body" experience)  Medications: Coenzyme Q10, levothyroxine  Technical Summary: This is a multichannel digital EEG recording, using the international 10-20 placement system.  Spike detection software was employed.  Description: The EEG background is symmetric, with a well-developed posterior dominant rhythm of 10 Hz, which is reactive to eye opening and closing.  Diffuse beta activity is seen, with a bilateral frontal preponderance.  No focal or generalized abnormalities are seen.  No focal or generalized epileptiform discharges are seen.  Stage II sleep is not seen.  Hyperventilation and photic stimulation were performed, and produced no abnormalities.  ECG revealed normal cardiac rate and rhythm.  Impression: This is a normal routine EEG of the awake state, with activating procedures.  A normal study does not rule out the possibility of a seizure disorder in this patient.  Heli Dino R. Tomi Likens, DO

## 2013-07-24 ENCOUNTER — Telehealth: Payer: Self-pay | Admitting: *Deleted

## 2013-07-24 NOTE — Telephone Encounter (Signed)
Called patient at mobile # [phone] this number is no longer working , I also tried home number no answer did not leave a voicemail

## 2013-07-26 DIAGNOSIS — H40009 Preglaucoma, unspecified, unspecified eye: Secondary | ICD-10-CM | POA: Diagnosis not present

## 2013-08-12 DIAGNOSIS — H35359 Cystoid macular degeneration, unspecified eye: Secondary | ICD-10-CM | POA: Diagnosis not present

## 2013-08-12 DIAGNOSIS — H35059 Retinal neovascularization, unspecified, unspecified eye: Secondary | ICD-10-CM | POA: Diagnosis not present

## 2013-08-12 DIAGNOSIS — H353290 Exudative age-related macular degeneration, unspecified eye, stage unspecified: Secondary | ICD-10-CM | POA: Diagnosis not present

## 2013-08-14 DIAGNOSIS — H353290 Exudative age-related macular degeneration, unspecified eye, stage unspecified: Secondary | ICD-10-CM | POA: Diagnosis not present

## 2013-08-14 DIAGNOSIS — H35059 Retinal neovascularization, unspecified, unspecified eye: Secondary | ICD-10-CM | POA: Diagnosis not present

## 2013-08-14 DIAGNOSIS — H35359 Cystoid macular degeneration, unspecified eye: Secondary | ICD-10-CM | POA: Diagnosis not present

## 2013-08-21 DIAGNOSIS — L259 Unspecified contact dermatitis, unspecified cause: Secondary | ICD-10-CM | POA: Diagnosis not present

## 2013-08-21 DIAGNOSIS — D235 Other benign neoplasm of skin of trunk: Secondary | ICD-10-CM | POA: Diagnosis not present

## 2013-08-21 DIAGNOSIS — I781 Nevus, non-neoplastic: Secondary | ICD-10-CM | POA: Diagnosis not present

## 2013-09-19 DIAGNOSIS — H35059 Retinal neovascularization, unspecified, unspecified eye: Secondary | ICD-10-CM | POA: Diagnosis not present

## 2013-09-19 DIAGNOSIS — H353290 Exudative age-related macular degeneration, unspecified eye, stage unspecified: Secondary | ICD-10-CM | POA: Diagnosis not present

## 2013-10-09 DIAGNOSIS — H353290 Exudative age-related macular degeneration, unspecified eye, stage unspecified: Secondary | ICD-10-CM | POA: Diagnosis not present

## 2013-10-09 DIAGNOSIS — H35059 Retinal neovascularization, unspecified, unspecified eye: Secondary | ICD-10-CM | POA: Diagnosis not present

## 2013-10-24 ENCOUNTER — Telehealth: Payer: Self-pay | Admitting: Family Medicine

## 2013-10-24 DIAGNOSIS — E538 Deficiency of other specified B group vitamins: Secondary | ICD-10-CM

## 2013-10-24 DIAGNOSIS — E782 Mixed hyperlipidemia: Secondary | ICD-10-CM

## 2013-10-24 DIAGNOSIS — Z79899 Other long term (current) drug therapy: Secondary | ICD-10-CM

## 2013-10-24 DIAGNOSIS — E039 Hypothyroidism, unspecified: Secondary | ICD-10-CM

## 2013-10-24 MED ORDER — LEVOTHYROXINE SODIUM 100 MCG PO TABS: ORAL_TABLET | ORAL | Status: DC

## 2013-10-24 NOTE — Telephone Encounter (Signed)
Had these labs on 05/14/13: Liver, Lipid, T4 free, TSH, B12, and Methylmalonic acid

## 2013-10-24 NOTE — Telephone Encounter (Signed)
bloodwork orders ready. Pt notified.  

## 2013-10-24 NOTE — Telephone Encounter (Signed)
Is patient due for bloodwork? She has an appointment for November 20, 2013. Also, she needs a refill on her levothyroxine.     [facility]

## 2013-10-24 NOTE — Telephone Encounter (Signed)
Lipid, liver, metabolic 7, TSH, [id number] hypothyroidism and B12 deficiency

## 2013-10-25 LAB — BASIC METABOLIC PANEL
BUN: 17 mg/dL (ref 6–23)
CALCIUM: 9.5 mg/dL (ref 8.4–10.5)
CO2: 26 mEq/L (ref 19–32)
CREATININE: 0.74 mg/dL (ref 0.50–1.10)
Chloride: 107 mEq/L (ref 96–112)
GLUCOSE: 87 mg/dL (ref 70–99)
Potassium: 4.1 mEq/L (ref 3.5–5.3)
Sodium: 142 mEq/L (ref 135–145)

## 2013-10-25 LAB — HEPATIC FUNCTION PANEL
ALK PHOS: 78 U/L (ref 39–117)
ALT: 28 U/L (ref 0–35)
AST: 27 U/L (ref 0–37)
Albumin: 4.2 g/dL (ref 3.5–5.2)
BILIRUBIN DIRECT: 0.1 mg/dL (ref 0.0–0.3)
BILIRUBIN INDIRECT: 0.5 mg/dL (ref 0.2–1.2)
Total Bilirubin: 0.6 mg/dL (ref 0.2–1.2)
Total Protein: 6.9 g/dL (ref 6.0–8.3)

## 2013-10-25 LAB — LIPID PANEL
CHOL/HDL RATIO: 3.6 ratio
CHOLESTEROL: 217 mg/dL — AB (ref 0–200)
HDL: 60 mg/dL (ref >39)
LDL Cholesterol: 135 mg/dL — ABNORMAL HIGH (ref 0–99)
Triglycerides: 112 mg/dL (ref <150)
VLDL: 22 mg/dL (ref 0–40)

## 2013-10-25 LAB — TSH: TSH: 4.173 u[IU]/mL (ref 0.350–4.500)

## 2013-10-25 LAB — VITAMIN B12: Vitamin B-12: 430 pg/mL (ref 211–911)

## 2013-10-29 DIAGNOSIS — Z1231 Encounter for screening mammogram for malignant neoplasm of breast: Secondary | ICD-10-CM | POA: Diagnosis not present

## 2013-10-31 DIAGNOSIS — H35059 Retinal neovascularization, unspecified, unspecified eye: Secondary | ICD-10-CM | POA: Diagnosis not present

## 2013-10-31 DIAGNOSIS — H353290 Exudative age-related macular degeneration, unspecified eye, stage unspecified: Secondary | ICD-10-CM | POA: Diagnosis not present

## 2013-11-19 ENCOUNTER — Encounter: Payer: Self-pay | Admitting: Family Medicine

## 2013-11-20 ENCOUNTER — Encounter: Payer: Self-pay | Admitting: Family Medicine

## 2013-11-20 ENCOUNTER — Ambulatory Visit (INDEPENDENT_AMBULATORY_CARE_PROVIDER_SITE_OTHER): Payer: Medicare Other | Admitting: Family Medicine

## 2013-11-20 VITALS — BP 130/82 | Ht 64.0 in | Wt 178.0 lb

## 2013-11-20 DIAGNOSIS — E785 Hyperlipidemia, unspecified: Secondary | ICD-10-CM | POA: Diagnosis not present

## 2013-11-20 DIAGNOSIS — Z23 Encounter for immunization: Secondary | ICD-10-CM

## 2013-11-20 DIAGNOSIS — E038 Other specified hypothyroidism: Secondary | ICD-10-CM | POA: Diagnosis not present

## 2013-11-20 MED ORDER — LEVOTHYROXINE SODIUM 112 MCG PO TABS: 112.0000 ug | ORAL_TABLET | Freq: Every day | ORAL | Status: DC

## 2013-11-20 MED ORDER — ROSUVASTATIN CALCIUM 10 MG PO TABS: 10.0000 mg | ORAL_TABLET | Freq: Every day | ORAL | Status: DC

## 2013-11-20 NOTE — Progress Notes (Signed)
   Subjective:    Patient ID: Susan Mcneil, female    DOB: 11-09-1945, 68 y.o.   MRN: [id number]  Hyperlipidemia This is a chronic problem. The current episode started more than 1 year ago. Exacerbating diseases include hypothyroidism. Pertinent negatives include no chest pain or shortness of breath. Current antihyperlipidemic treatment includes statins. There are no compliance problems.  Risk factors for coronary artery disease include post-menopausal.   Healthy diet Walking for exercise  Patient also would like to follow up on memory issue from December- patient has seen Neurologist and he cleared her. Patient feels her memory is doing well now. Her MRI did show some microvascular changes certainly want to do everything we can to prevent a stroke by keeping cholesterol and blood pressure under good control Review of Systems  Constitutional: Negative for fever, activity change, appetite change and fatigue.  HENT: Positive for congestion and rhinorrhea. Negative for ear pain.   Eyes: Negative for discharge.  Respiratory: Positive for cough. Negative for chest tightness, shortness of breath and wheezing.   Cardiovascular: Negative for chest pain and palpitations.  Gastrointestinal: Negative for abdominal pain.  Endocrine: Negative for polydipsia and polyphagia.  Genitourinary: Negative for frequency.  Neurological: Negative for weakness.  Psychiatric/Behavioral: Negative for confusion.       Objective:   Physical Exam  Vitals reviewed. Constitutional: She appears well-nourished. No distress.  Cardiovascular: Normal rate, regular rhythm and normal heart sounds.   No murmur heard. Pulmonary/Chest: Effort normal and breath sounds normal. No respiratory distress.  Musculoskeletal: She exhibits no edema.  Lymphadenopathy:    She has no cervical adenopathy.  Neurological: She is alert. She exhibits normal muscle tone.  Psychiatric: Her behavior is normal.          Assessment &  Plan:  #1 hyperlipidemia-her current medications no longer doing what needs to recommend Crestor 10 mg daily recheck lipid liver profile in approximately 8 weeks goal is to get LDL below 100  #2 hypothyroidism increase the dose of medicine to try to get TSH 0.5 and 2.5. Watch diet closely  Pneumococcal vaccine today  25 minutes spent with patient discussing all these issues.

## 2013-12-05 DIAGNOSIS — H353290 Exudative age-related macular degeneration, unspecified eye, stage unspecified: Secondary | ICD-10-CM | POA: Diagnosis not present

## 2013-12-05 DIAGNOSIS — H35059 Retinal neovascularization, unspecified, unspecified eye: Secondary | ICD-10-CM | POA: Diagnosis not present

## 2013-12-16 ENCOUNTER — Ambulatory Visit: Payer: Medicare Other | Admitting: Neurology

## 2013-12-18 DIAGNOSIS — H353290 Exudative age-related macular degeneration, unspecified eye, stage unspecified: Secondary | ICD-10-CM | POA: Diagnosis not present

## 2013-12-18 DIAGNOSIS — H35059 Retinal neovascularization, unspecified, unspecified eye: Secondary | ICD-10-CM | POA: Diagnosis not present

## 2014-01-09 DIAGNOSIS — H353290 Exudative age-related macular degeneration, unspecified eye, stage unspecified: Secondary | ICD-10-CM | POA: Diagnosis not present

## 2014-01-09 DIAGNOSIS — H35059 Retinal neovascularization, unspecified, unspecified eye: Secondary | ICD-10-CM | POA: Diagnosis not present

## 2014-01-09 DIAGNOSIS — H35359 Cystoid macular degeneration, unspecified eye: Secondary | ICD-10-CM | POA: Diagnosis not present

## 2014-02-12 DIAGNOSIS — Z23 Encounter for immunization: Secondary | ICD-10-CM | POA: Diagnosis not present

## 2014-02-13 DIAGNOSIS — H3532 Exudative age-related macular degeneration: Secondary | ICD-10-CM | POA: Diagnosis not present

## 2014-03-19 DIAGNOSIS — H3532 Exudative age-related macular degeneration: Secondary | ICD-10-CM | POA: Diagnosis not present

## 2014-03-19 DIAGNOSIS — H35052 Retinal neovascularization, unspecified, left eye: Secondary | ICD-10-CM | POA: Diagnosis not present

## 2014-03-24 DIAGNOSIS — H35052 Retinal neovascularization, unspecified, left eye: Secondary | ICD-10-CM | POA: Diagnosis not present

## 2014-03-24 DIAGNOSIS — H3532 Exudative age-related macular degeneration: Secondary | ICD-10-CM | POA: Diagnosis not present

## 2014-04-09 DIAGNOSIS — H18899 Other specified disorders of cornea, unspecified eye: Secondary | ICD-10-CM | POA: Diagnosis not present

## 2014-04-09 DIAGNOSIS — H40009 Preglaucoma, unspecified, unspecified eye: Secondary | ICD-10-CM | POA: Diagnosis not present

## 2014-04-09 DIAGNOSIS — H40019 Open angle with borderline findings, low risk, unspecified eye: Secondary | ICD-10-CM | POA: Diagnosis not present

## 2014-04-24 DIAGNOSIS — H3532 Exudative age-related macular degeneration: Secondary | ICD-10-CM | POA: Diagnosis not present

## 2014-04-24 DIAGNOSIS — H35051 Retinal neovascularization, unspecified, right eye: Secondary | ICD-10-CM | POA: Diagnosis not present

## 2014-06-04 DIAGNOSIS — H3532 Exudative age-related macular degeneration: Secondary | ICD-10-CM | POA: Diagnosis not present

## 2014-06-04 DIAGNOSIS — H35051 Retinal neovascularization, unspecified, right eye: Secondary | ICD-10-CM | POA: Diagnosis not present

## 2014-06-10 ENCOUNTER — Telehealth: Payer: Self-pay | Admitting: Family Medicine

## 2014-06-10 DIAGNOSIS — Z131 Encounter for screening for diabetes mellitus: Secondary | ICD-10-CM

## 2014-06-10 DIAGNOSIS — Z79899 Other long term (current) drug therapy: Secondary | ICD-10-CM

## 2014-06-10 DIAGNOSIS — E785 Hyperlipidemia, unspecified: Secondary | ICD-10-CM

## 2014-06-10 DIAGNOSIS — E039 Hypothyroidism, unspecified: Secondary | ICD-10-CM

## 2014-06-10 NOTE — Telephone Encounter (Signed)
10/24/13: lip, liv, met7, tsh, b12

## 2014-06-10 NOTE — Telephone Encounter (Signed)
Does patient need order for BW for upcoming med check?

## 2014-06-10 NOTE — Telephone Encounter (Signed)
tsh lipid liver glucose- hyperlipid, hypothyroid,screening diabetes

## 2014-06-10 NOTE — Telephone Encounter (Signed)
Patient notified and verbalized understanding. 

## 2014-06-23 ENCOUNTER — Other Ambulatory Visit: Payer: Self-pay | Admitting: Family Medicine

## 2014-06-23 ENCOUNTER — Ambulatory Visit: Payer: Medicare Other | Admitting: Family Medicine

## 2014-06-25 DIAGNOSIS — H3532 Exudative age-related macular degeneration: Secondary | ICD-10-CM | POA: Diagnosis not present

## 2014-06-26 ENCOUNTER — Other Ambulatory Visit (INDEPENDENT_AMBULATORY_CARE_PROVIDER_SITE_OTHER): Payer: Self-pay | Admitting: General Surgery

## 2014-07-01 ENCOUNTER — Encounter: Payer: Self-pay | Admitting: Family Medicine

## 2014-07-01 ENCOUNTER — Ambulatory Visit (INDEPENDENT_AMBULATORY_CARE_PROVIDER_SITE_OTHER): Payer: Medicare Other | Admitting: Family Medicine

## 2014-07-01 VITALS — BP 132/92 | Ht 64.0 in | Wt 180.0 lb

## 2014-07-01 DIAGNOSIS — E039 Hypothyroidism, unspecified: Secondary | ICD-10-CM | POA: Diagnosis not present

## 2014-07-01 DIAGNOSIS — E785 Hyperlipidemia, unspecified: Secondary | ICD-10-CM | POA: Diagnosis not present

## 2014-07-01 DIAGNOSIS — Z139 Encounter for screening, unspecified: Secondary | ICD-10-CM | POA: Diagnosis not present

## 2014-07-01 DIAGNOSIS — IMO0001 Reserved for inherently not codable concepts without codable children: Secondary | ICD-10-CM

## 2014-07-01 DIAGNOSIS — R030 Elevated blood-pressure reading, without diagnosis of hypertension: Secondary | ICD-10-CM

## 2014-07-01 DIAGNOSIS — H3530 Unspecified macular degeneration: Secondary | ICD-10-CM | POA: Diagnosis not present

## 2014-07-01 MED ORDER — LEVOTHYROXINE SODIUM 112 MCG PO TABS: ORAL_TABLET | ORAL | Status: DC

## 2014-07-01 MED ORDER — ROSUVASTATIN CALCIUM 10 MG PO TABS: 10.0000 mg | ORAL_TABLET | Freq: Every day | ORAL | Status: DC

## 2014-07-01 NOTE — Progress Notes (Signed)
   Subjective:    Patient ID: Susan Mcneil, female    DOB: 01-May-1946, 69 y.o.   MRN: [id number]  Hyperlipidemia This is a chronic problem. The current episode started more than 1 year ago. Treatments tried: crestor. There are no compliance problems.   Exercises at home on treadmill and bike.   Pt states no concerns today.  Pt has not yet had bloodwork done. She states she went to [facility] and they were closed.  Review of Systems Denies chest tightness pressure pain shortness breath nausea vomiting diarrhea denies rectal pain. Denies rectal bleeding    Objective:   Physical Exam Lungs clear heart trigger pulse normal extremities no edema skin warm dry blood pressure checked twice       Assessment & Plan:  #1 hyperlipidemia continue medication check lab work await the results Hypothyroidism continue medication check lab work await the results Hyperglycemia history check glucose await the results. Elevated blood pressure I am concerned about her possibility of developing hypertension she is a watch her diet she is exercise on a regular basis and follow-up within 3 months if blood pressure still elevated will start medication  Patient being followed by specialist for macular degeneration

## 2014-07-01 NOTE — Patient Instructions (Addendum)
DASH Eating Plan DASH stands for "Dietary Approaches to Stop Hypertension." The DASH eating plan is a healthy eating plan that has been shown to reduce high blood pressure (hypertension). Additional health benefits may include reducing the risk of type 2 diabetes mellitus, heart disease, and stroke. The DASH eating plan may also help with weight loss. WHAT DO I NEED TO KNOW ABOUT THE DASH EATING PLAN? For the DASH eating plan, you will follow these general guidelines:  Choose foods with a percent daily value for sodium of less than 5% (as listed on the food label).  Use salt-free seasonings or herbs instead of table salt or sea salt.  Check with your health care provider or pharmacist before using salt substitutes.  Eat lower-sodium products, often labeled as "lower sodium" or "no salt added."  Eat fresh foods.  Eat more vegetables, fruits, and low-fat dairy products.  Choose whole grains. Look for the word "whole" as the first word in the ingredient list.  Choose fish and skinless chicken or turkey more often than red meat. Limit fish, poultry, and meat to 6 oz (170 g) each day.  Limit sweets, desserts, sugars, and sugary drinks.  Choose heart-healthy fats.  Limit cheese to 1 oz (28 g) per day.  Eat more home-cooked food and less restaurant, buffet, and fast food.  Limit fried foods.  Cook foods using methods other than frying.  Limit canned vegetables. If you do use them, rinse them well to decrease the sodium.  When eating at a restaurant, ask that your food be prepared with less salt, or no salt if possible. WHAT FOODS CAN I EAT? Seek help from a dietitian for individual calorie needs. Grains Whole grain or whole wheat bread. Brown rice. Whole grain or whole wheat pasta. Quinoa, bulgur, and whole grain cereals. Low-sodium cereals. Corn or whole wheat flour tortillas. Whole grain cornbread. Whole grain crackers. Low-sodium crackers. Vegetables Fresh or frozen vegetables  (raw, steamed, roasted, or grilled). Low-sodium or reduced-sodium tomato and vegetable juices. Low-sodium or reduced-sodium tomato sauce and paste. Low-sodium or reduced-sodium canned vegetables.  Fruits All fresh, canned (in natural juice), or frozen fruits. Meat and Other Protein Products Ground beef (85% or leaner), grass-fed beef, or beef trimmed of fat. Skinless chicken or turkey. Ground chicken or turkey. Pork trimmed of fat. All fish and seafood. Eggs. Dried beans, peas, or lentils. Unsalted nuts and seeds. Unsalted canned beans. Dairy Low-fat dairy products, such as skim or 1% milk, 2% or reduced-fat cheeses, low-fat ricotta or cottage cheese, or plain low-fat yogurt. Low-sodium or reduced-sodium cheeses. Fats and Oils Tub margarines without trans fats. Light or reduced-fat mayonnaise and salad dressings (reduced sodium). Avocado. Safflower, olive, or canola oils. Natural peanut or almond butter. Other Unsalted popcorn and pretzels. The items listed above may not be a complete list of recommended foods or beverages. Contact your dietitian for more options. WHAT FOODS ARE NOT RECOMMENDED? Grains White bread. White pasta. White rice. Refined cornbread. Bagels and croissants. Crackers that contain trans fat. Vegetables Creamed or fried vegetables. Vegetables in a cheese sauce. Regular canned vegetables. Regular canned tomato sauce and paste. Regular tomato and vegetable juices. Fruits Dried fruits. Canned fruit in light or heavy syrup. Fruit juice. Meat and Other Protein Products Fatty cuts of meat. Ribs, chicken wings, bacon, sausage, bologna, salami, chitterlings, fatback, hot dogs, bratwurst, and packaged luncheon meats. Salted nuts and seeds. Canned beans with salt. Dairy Whole or 2% milk, cream, half-and-half, and cream cheese. Whole-fat or sweetened yogurt. Full-fat   cheeses or blue cheese. Nondairy creamers and whipped toppings. Processed cheese, cheese spreads, or cheese  curds. Condiments Onion and garlic salt, seasoned salt, table salt, and sea salt. Canned and packaged gravies. Worcestershire sauce. Tartar sauce. Barbecue sauce. Teriyaki sauce. Soy sauce, including reduced sodium. Steak sauce. Fish sauce. Oyster sauce. Cocktail sauce. Horseradish. Ketchup and mustard. Meat flavorings and tenderizers. Bouillon cubes. Hot sauce. Tabasco sauce. Marinades. Taco seasonings. Relishes. Fats and Oils Butter, stick margarine, lard, shortening, ghee, and bacon fat. Coconut, palm kernel, or palm oils. Regular salad dressings. Other Pickles and olives. Salted popcorn and pretzels. The items listed above may not be a complete list of foods and beverages to avoid. Contact your dietitian for more information. WHERE CAN I FIND MORE INFORMATION? National Heart, Lung, and Blood Institute: travelstabloid.com Document Released: 04/14/2011 Document Revised: 09/09/2013 Document Reviewed: 02/27/2013 [facility] Patient Information 2015 [place], [place]. This information is not intended to replace advice given to you by your health care provider. Make sure you discuss any questions you have with your health care provider.  20 to  30 minutes of walking at least 5 days a week  Watch portions  Bring your blood pressure cuff with you when you come

## 2014-07-02 DIAGNOSIS — R030 Elevated blood-pressure reading, without diagnosis of hypertension: Secondary | ICD-10-CM | POA: Diagnosis not present

## 2014-07-02 DIAGNOSIS — E785 Hyperlipidemia, unspecified: Secondary | ICD-10-CM | POA: Diagnosis not present

## 2014-07-02 DIAGNOSIS — Z139 Encounter for screening, unspecified: Secondary | ICD-10-CM | POA: Diagnosis not present

## 2014-07-02 DIAGNOSIS — E039 Hypothyroidism, unspecified: Secondary | ICD-10-CM | POA: Diagnosis not present

## 2014-07-03 LAB — GLUCOSE, FASTING: Glucose, Plasma: 86 mg/dL (ref 65–99)

## 2014-07-03 LAB — LIPID PANEL
CHOLESTEROL TOTAL: 146 mg/dL (ref 100–199)
Chol/HDL Ratio: 2.2 ratio units (ref 0.0–4.4)
HDL: 67 mg/dL (ref >39)
LDL Calculated: 64 mg/dL (ref 0–99)
Triglycerides: 75 mg/dL (ref 0–149)
VLDL CHOLESTEROL CAL: 15 mg/dL (ref 5–40)

## 2014-07-03 LAB — HEPATIC FUNCTION PANEL
ALT: 24 IU/L (ref 0–32)
AST: 30 IU/L (ref 0–40)
Albumin: 4.8 g/dL (ref 3.6–4.8)
Alkaline Phosphatase: 79 IU/L (ref 39–117)
BILIRUBIN TOTAL: 0.5 mg/dL (ref 0.0–1.2)
BILIRUBIN, DIRECT: 0.13 mg/dL (ref 0.00–0.40)
Total Protein: 6.7 g/dL (ref 6.0–8.5)

## 2014-07-03 LAB — TSH: TSH: 0.314 u[IU]/mL — ABNORMAL LOW (ref 0.450–4.500)

## 2014-07-04 DIAGNOSIS — H40003 Preglaucoma, unspecified, bilateral: Secondary | ICD-10-CM | POA: Diagnosis not present

## 2014-07-04 DIAGNOSIS — H3532 Exudative age-related macular degeneration: Secondary | ICD-10-CM | POA: Diagnosis not present

## 2014-07-07 MED ORDER — LEVOTHYROXINE SODIUM 112 MCG PO TABS: ORAL_TABLET | ORAL | Status: DC

## 2014-07-07 NOTE — Addendum Note (Signed)
Addended byCharolotte Capuchin D on: 07/07/2014 10:57 AM   Modules accepted: Orders

## 2014-07-07 NOTE — Progress Notes (Signed)
Medication changed in EPIC. Patient notified and verbalized understanding of the test results. No further questions.

## 2014-07-17 ENCOUNTER — Encounter: Payer: Self-pay | Admitting: Nurse Practitioner

## 2014-07-17 ENCOUNTER — Ambulatory Visit (INDEPENDENT_AMBULATORY_CARE_PROVIDER_SITE_OTHER): Payer: Medicare Other | Admitting: Nurse Practitioner

## 2014-07-17 VITALS — BP 172/88 | Ht 64.0 in | Wt 178.0 lb

## 2014-07-17 DIAGNOSIS — I10 Essential (primary) hypertension: Secondary | ICD-10-CM | POA: Diagnosis not present

## 2014-07-17 DIAGNOSIS — E038 Other specified hypothyroidism: Secondary | ICD-10-CM | POA: Diagnosis not present

## 2014-07-17 MED ORDER — LISINOPRIL 5 MG PO TABS: 5.0000 mg | ORAL_TABLET | Freq: Every day | ORAL | Status: DC

## 2014-07-17 MED ORDER — LEVOTHYROXINE SODIUM 100 MCG PO TABS: 100.0000 ug | ORAL_TABLET | Freq: Every day | ORAL | Status: DC

## 2014-07-17 NOTE — Patient Instructions (Signed)
[facility] for automatic BP machine

## 2014-07-20 ENCOUNTER — Encounter: Payer: Self-pay | Admitting: Nurse Practitioner

## 2014-07-20 NOTE — Progress Notes (Signed)
Subjective:  Presents for recheck. Recent TSH was low; patient did not cut back on medicine as recommended. BP has been running very high outside of office. At dentist on Monday 153/91. Running high with her electronic cuff at home. Brought this in for [place] to compare. No CP/ishemic type pain, SOB or neurologic symptoms. No change in stress. No change in sodium. Mild fatigue.   Objective:   BP 178/110 mmHg  Ht 5\' 4"  (1.626 m)  Wt 178 lb (80.74 kg)  BMI 30.54 kg/m2 NAD. Alert, oriented. Lungs clear. Heart RRR. No murmur or gallop noted. BP on recheck right arm sitting 172/88. This does not correlate at all with her electronic cuff. Thyroid: no masses or goiter. Non tender. TSH on 2/24 was low at 0.3. Lower extremities no edema.  Assessment:  Problem List Items Addressed This Visit      Cardiovascular and Mediastinum   HTN (hypertension) - Primary   Relevant Medications   lisinopril (PRINIVIL,ZESTRIL) tablet     Endocrine   Hypothyroidism   Relevant Medications   levothyroxine (SYNTHROID, LEVOTHROID) tablet   Other Relevant Orders   TSH     Plan:  Meds ordered this encounter  Medications  . levothyroxine (SYNTHROID, LEVOTHROID) 100 MCG tablet    Sig: Take 1 tablet (100 mcg total) by mouth daily.    Dispense:  90 tablet    Refill:  1    Order Specific Question:  Supervising Provider    Answer:  Mikey Kirschner [[id number]]  . lisinopril (PRINIVIL,ZESTRIL) 5 MG tablet    Sig: Take 1 tablet (5 mg total) by mouth daily.    Dispense:  90 tablet    Refill:  1    Order Specific Question:  Supervising Provider    Answer:  Mikey Kirschner [[id number]]   Repeat TSH in 3 months. Reminded about wellness physical.  Return in about 3 months (around 10/17/2014) for recheck.

## 2014-07-21 DIAGNOSIS — E038 Other specified hypothyroidism: Secondary | ICD-10-CM | POA: Diagnosis not present

## 2014-07-22 LAB — TSH: TSH: 0.132 u[IU]/mL — ABNORMAL LOW (ref 0.450–4.500)

## 2014-07-22 NOTE — Addendum Note (Signed)
Addended by: Carmelina Noun on: 07/22/2014 11:06 AM   Modules accepted: Orders

## 2014-07-28 DIAGNOSIS — H00029 Hordeolum internum unspecified eye, unspecified eyelid: Secondary | ICD-10-CM | POA: Diagnosis not present

## 2014-07-28 DIAGNOSIS — H2511 Age-related nuclear cataract, right eye: Secondary | ICD-10-CM | POA: Diagnosis not present

## 2014-07-28 DIAGNOSIS — H4011X0 Primary open-angle glaucoma, stage unspecified: Secondary | ICD-10-CM | POA: Diagnosis not present

## 2014-07-28 DIAGNOSIS — H3531 Nonexudative age-related macular degeneration: Secondary | ICD-10-CM | POA: Diagnosis not present

## 2014-07-28 DIAGNOSIS — H25011 Cortical age-related cataract, right eye: Secondary | ICD-10-CM | POA: Diagnosis not present

## 2014-08-04 ENCOUNTER — Other Ambulatory Visit: Payer: Self-pay | Admitting: *Deleted

## 2014-08-07 DIAGNOSIS — H3532 Exudative age-related macular degeneration: Secondary | ICD-10-CM | POA: Diagnosis not present

## 2014-08-14 DIAGNOSIS — H4011X0 Primary open-angle glaucoma, stage unspecified: Secondary | ICD-10-CM | POA: Diagnosis not present

## 2014-08-20 DIAGNOSIS — H3532 Exudative age-related macular degeneration: Secondary | ICD-10-CM | POA: Diagnosis not present

## 2014-08-20 DIAGNOSIS — H35052 Retinal neovascularization, unspecified, left eye: Secondary | ICD-10-CM | POA: Diagnosis not present

## 2014-08-20 DIAGNOSIS — H35051 Retinal neovascularization, unspecified, right eye: Secondary | ICD-10-CM | POA: Diagnosis not present

## 2014-08-20 DIAGNOSIS — H40003 Preglaucoma, unspecified, bilateral: Secondary | ICD-10-CM | POA: Diagnosis not present

## 2014-08-27 ENCOUNTER — Ambulatory Visit (INDEPENDENT_AMBULATORY_CARE_PROVIDER_SITE_OTHER): Payer: Medicare Other | Admitting: Family Medicine

## 2014-08-27 ENCOUNTER — Encounter: Payer: Self-pay | Admitting: Family Medicine

## 2014-08-27 VITALS — BP 142/88 | Ht 64.0 in | Wt 177.4 lb

## 2014-08-27 DIAGNOSIS — E785 Hyperlipidemia, unspecified: Secondary | ICD-10-CM | POA: Diagnosis not present

## 2014-08-27 DIAGNOSIS — I10 Essential (primary) hypertension: Secondary | ICD-10-CM

## 2014-08-27 MED ORDER — LISINOPRIL 10 MG PO TABS: 10.0000 mg | ORAL_TABLET | Freq: Every day | ORAL | Status: DC

## 2014-08-27 NOTE — Progress Notes (Signed)
   Subjective:    Patient ID: Susan Mcneil, female    DOB: [birth year], 69 y.o.   MRN: [id number]  Hypertension This is a new problem. The current episode started more than 1 month ago. Pertinent negatives include no chest pain. There are no compliance problems.   pt is walking 3 times weekly and eats a healthy diet. pt seen on 3/10 for Hypertension and was started on lisinopril 5mg  one daily.  Pt states no other concerns today.  Patient denies any type of angina symptoms no shortness of breath.  Review of Systems  Constitutional: Negative for activity change, appetite change and fatigue.  HENT: Negative for congestion.   Respiratory: Negative for cough.   Cardiovascular: Negative for chest pain.  Gastrointestinal: Negative for abdominal pain.  Endocrine: Negative for polydipsia and polyphagia.  Neurological: Negative for weakness.  Psychiatric/Behavioral: Negative for confusion.   Patient relates that her blood pressure was elevated at the dentist with the automated cuff    Objective:   Physical Exam  Constitutional: She appears well-nourished. No distress.  Cardiovascular: Normal rate, regular rhythm and normal heart sounds.   No murmur heard. Pulmonary/Chest: Effort normal and breath sounds normal. No respiratory distress.  Musculoskeletal: She exhibits no edema.  Lymphadenopathy:    She has no cervical adenopathy.  Neurological: She is alert. She exhibits normal muscle tone.  Psychiatric: Her behavior is normal.  Vitals reviewed.         Assessment & Plan:  HTN subpar control we truly going to see her diastolic in the 40J if possible or low 80s. I would recommend heart healthy diet regular physical activity walking, we will double up on the lisinopril follow-up the patient in several weeks May need follow-up metabolic 7 continue other medications as is

## 2014-08-27 NOTE — Patient Instructions (Signed)
Hypertension Hypertension, commonly called high blood pressure, is when the force of blood pumping through your arteries is too strong. Your arteries are the blood vessels that carry blood from your heart throughout your body. A blood pressure reading consists of a higher number over a lower number, such as 110/72. The higher number (systolic) is the pressure inside your arteries when your heart pumps. The lower number (diastolic) is the pressure inside your arteries when your heart relaxes. Ideally you want your blood pressure below 120/80. Hypertension forces your heart to work harder to pump blood. Your arteries may become narrow or stiff. Having hypertension puts you at risk for heart disease, stroke, and other problems.  RISK FACTORS Some risk factors for high blood pressure are controllable. Others are not.  Risk factors you cannot control include:   Race. You may be at higher risk if you are African American.  Age. Risk increases with age.  Gender. Men are at higher risk than women before age 45 years. After age 65, women are at higher risk than men. Risk factors you can control include:  Not getting enough exercise or physical activity.  Being overweight.  Getting too much fat, sugar, calories, or salt in your diet.  Drinking too much alcohol. SIGNS AND SYMPTOMS Hypertension does not usually cause signs or symptoms. Extremely high blood pressure (hypertensive crisis) may cause headache, anxiety, shortness of breath, and nosebleed. DIAGNOSIS  To check if you have hypertension, your health care provider will measure your blood pressure while you are seated, with your arm held at the level of your heart. It should be measured at least twice using the same arm. Certain conditions can cause a difference in blood pressure between your right and left arms. A blood pressure reading that is higher than normal on one occasion does not mean that you need treatment. If one blood pressure reading  is high, ask your health care provider about having it checked again. TREATMENT  Treating high blood pressure includes making lifestyle changes and possibly taking medicine. Living a healthy lifestyle can help lower high blood pressure. You may need to change some of your habits. Lifestyle changes may include:  Following the DASH diet. This diet is high in fruits, vegetables, and whole grains. It is low in salt, red meat, and added sugars.  Getting at least 2 hours of brisk physical activity every week.  Losing weight if necessary.  Not smoking.  Limiting alcoholic beverages.  Learning ways to reduce stress. If lifestyle changes are not enough to get your blood pressure under control, your health care provider may prescribe medicine. You may need to take more than one. Work closely with your health care provider to understand the risks and benefits. HOME CARE INSTRUCTIONS  Have your blood pressure rechecked as directed by your health care provider.   Take medicines only as directed by your health care provider. Follow the directions carefully. Blood pressure medicines must be taken as prescribed. The medicine does not work as well when you skip doses. Skipping doses also puts you at risk for problems.   Do not smoke.   Monitor your blood pressure at home as directed by your health care provider. SEEK MEDICAL CARE IF:   You think you are having a reaction to medicines taken.  You have recurrent headaches or feel dizzy.  You have swelling in your ankles.  You have trouble with your vision. SEEK IMMEDIATE MEDICAL CARE IF:  You develop a severe headache or confusion.    You have unusual weakness, numbness, or feel faint.  You have severe chest or abdominal pain.  You vomit repeatedly.  You have trouble breathing. MAKE SURE YOU:   Understand these instructions.  Will watch your condition.  Will get help right away if you are not doing well or get worse. Document  Released: 04/25/2005 Document Revised: 09/09/2013 Document Reviewed: 02/15/2013 ExitCare Patient Information 2015 [employer]. This information is not intended to replace advice given to you by your health care provider. Make sure you discuss any questions you have with your health care provider. DASH Eating Plan DASH stands for "Dietary Approaches to Stop Hypertension." The DASH eating plan is a healthy eating plan that has been shown to reduce high blood pressure (hypertension). Additional health benefits may include reducing the risk of type 2 diabetes mellitus, heart disease, and stroke. The DASH eating plan may also help with weight loss. WHAT DO I NEED TO KNOW ABOUT THE DASH EATING PLAN? For the DASH eating plan, you will follow these general guidelines:  Choose foods with a percent daily value for sodium of less than 5% (as listed on the food label).  Use salt-free seasonings or herbs instead of table salt or sea salt.  Check with your health care provider or pharmacist before using salt substitutes.  Eat lower-sodium products, often labeled as "lower sodium" or "no salt added."  Eat fresh foods.  Eat more vegetables, fruits, and low-fat dairy products.  Choose whole grains. Look for the word "whole" as the first word in the ingredient list.  Choose fish and skinless chicken or turkey more often than red meat. Limit fish, poultry, and meat to 6 oz (170 g) each day.  Limit sweets, desserts, sugars, and sugary drinks.  Choose heart-healthy fats.  Limit cheese to 1 oz (28 g) per day.  Eat more home-cooked food and less restaurant, buffet, and fast food.  Limit fried foods.  Cook foods using methods other than frying.  Limit canned vegetables. If you do use them, rinse them well to decrease the sodium.  When eating at a restaurant, ask that your food be prepared with less salt, or no salt if possible. WHAT FOODS CAN I EAT? Seek help from a dietitian for individual  calorie needs. Grains Whole grain or whole wheat bread. Brown rice. Whole grain or whole wheat pasta. Quinoa, bulgur, and whole grain cereals. Low-sodium cereals. Corn or whole wheat flour tortillas. Whole grain cornbread. Whole grain crackers. Low-sodium crackers. Vegetables Fresh or frozen vegetables (raw, steamed, roasted, or grilled). Low-sodium or reduced-sodium tomato and vegetable juices. Low-sodium or reduced-sodium tomato sauce and paste. Low-sodium or reduced-sodium canned vegetables.  Fruits All fresh, canned (in natural juice), or frozen fruits. Meat and Other Protein Products Ground beef (85% or leaner), grass-fed beef, or beef trimmed of fat. Skinless chicken or turkey. Ground chicken or turkey. Pork trimmed of fat. All fish and seafood. Eggs. Dried beans, peas, or lentils. Unsalted nuts and seeds. Unsalted canned beans. Dairy Low-fat dairy products, such as skim or 1% milk, 2% or reduced-fat cheeses, low-fat ricotta or cottage cheese, or plain low-fat yogurt. Low-sodium or reduced-sodium cheeses. Fats and Oils Tub margarines without trans fats. Light or reduced-fat mayonnaise and salad dressings (reduced sodium). Avocado. Safflower, olive, or canola oils. Natural peanut or almond butter. Other Unsalted popcorn and pretzels. The items listed above may not be a complete list of recommended foods or beverages. Contact your dietitian for more options. WHAT FOODS ARE NOT RECOMMENDED? Grains White bread.   White pasta. White rice. Refined cornbread. Bagels and croissants. Crackers that contain trans fat. Vegetables Creamed or fried vegetables. Vegetables in a cheese sauce. Regular canned vegetables. Regular canned tomato sauce and paste. Regular tomato and vegetable juices. Fruits Dried fruits. Canned fruit in light or heavy syrup. Fruit juice. Meat and Other Protein Products Fatty cuts of meat. Ribs, chicken wings, bacon, sausage, bologna, salami, chitterlings, fatback, hot dogs,  bratwurst, and packaged luncheon meats. Salted nuts and seeds. Canned beans with salt. Dairy Whole or 2% milk, cream, half-and-half, and cream cheese. Whole-fat or sweetened yogurt. Full-fat cheeses or blue cheese. Nondairy creamers and whipped toppings. Processed cheese, cheese spreads, or cheese curds. Condiments Onion and garlic salt, seasoned salt, table salt, and sea salt. Canned and packaged gravies. Worcestershire sauce. Tartar sauce. Barbecue sauce. Teriyaki sauce. Soy sauce, including reduced sodium. Steak sauce. Fish sauce. Oyster sauce. Cocktail sauce. Horseradish. Ketchup and mustard. Meat flavorings and tenderizers. Bouillon cubes. Hot sauce. Tabasco sauce. Marinades. Taco seasonings. Relishes. Fats and Oils Butter, stick margarine, lard, shortening, ghee, and bacon fat. Coconut, palm kernel, or palm oils. Regular salad dressings. Other Pickles and olives. Salted popcorn and pretzels. The items listed above may not be a complete list of foods and beverages to avoid. Contact your dietitian for more information. WHERE CAN I FIND MORE INFORMATION? National Heart, Lung, and [employer] Institute: www.nhlbi.nih.gov/health/health-topics/topics/dash/ Document Released: 04/14/2011 Document Revised: 09/09/2013 Document Reviewed: 02/27/2013 ExitCare Patient Information 2015 [employer]. This information is not intended to replace advice given to you by your health care provider. Make sure you discuss any questions you have with your health care provider.  

## 2014-09-18 DIAGNOSIS — H3532 Exudative age-related macular degeneration: Secondary | ICD-10-CM | POA: Diagnosis not present

## 2014-09-22 ENCOUNTER — Encounter: Payer: Self-pay | Admitting: Family Medicine

## 2014-09-22 ENCOUNTER — Ambulatory Visit (HOSPITAL_COMMUNITY)
Admission: RE | Admit: 2014-09-22 | Discharge: 2014-09-22 | Disposition: A | Discharge status: Home / Self Care | Payer: Medicare Other | Source: Ambulatory Visit | Attending: Family Medicine | Admitting: Family Medicine

## 2014-09-22 ENCOUNTER — Ambulatory Visit (INDEPENDENT_AMBULATORY_CARE_PROVIDER_SITE_OTHER): Payer: Medicare Other | Admitting: Family Medicine

## 2014-09-22 VITALS — BP 132/80 | Temp 98.3°F | Ht 64.0 in | Wt 177.2 lb

## 2014-09-22 DIAGNOSIS — S82892A Other fracture of left lower leg, initial encounter for closed fracture: Secondary | ICD-10-CM

## 2014-09-22 DIAGNOSIS — S93402A Sprain of unspecified ligament of left ankle, initial encounter: Secondary | ICD-10-CM

## 2014-09-22 DIAGNOSIS — M25572 Pain in left ankle and joints of left foot: Secondary | ICD-10-CM | POA: Diagnosis present

## 2014-09-22 DIAGNOSIS — S82832A Other fracture of upper and lower end of left fibula, initial encounter for closed fracture: Secondary | ICD-10-CM | POA: Insufficient documentation

## 2014-09-22 DIAGNOSIS — X58XXXA Exposure to other specified factors, initial encounter: Secondary | ICD-10-CM | POA: Diagnosis not present

## 2014-09-22 NOTE — Progress Notes (Signed)
   Subjective:    Patient ID: Susan Mcneil, female    DOB: 11/25/1945, 69 y.o.   MRN: [id number]  Ankle Pain  The incident occurred 2 days ago. The injury mechanism was a fall. The pain is present in the left ankle. The quality of the pain is described as burning. She reports no foreign bodies present. Nothing aggravates the symptoms. She has tried NSAIDs and ice (Aleve) for the symptoms. The treatment provided no relief.   Patient was walking and fell and hit ankle on concrete on Saturday.  Review of Systems    ankle pain discomfort swelling bruising tenderness Objective:   Physical Exam  The patient has swelling tenderness blueness of the ankle more in the lateral aspect the tenderness on the bone on the lateral aspect present both anterior and posterior. Range of motion fair.      Assessment & Plan:  X-ray ordered await the results Range of motion exercises shown as well as strengthening exercises Anti-inflammatory when necessary  Should be noted that the x-ray came back showing a small fracture she was referred for further evaluation treatment

## 2014-09-22 NOTE — Patient Instructions (Signed)
Cold compresses 20 minutes every 2 hours while awake  Range of motion exercises ( pen and paper, and ankle lifts)

## 2014-09-23 ENCOUNTER — Ambulatory Visit (INDEPENDENT_AMBULATORY_CARE_PROVIDER_SITE_OTHER): Payer: Medicare Other | Admitting: Orthopedic Surgery

## 2014-09-23 VITALS — BP 161/82 | Ht 64.0 in | Wt 177.0 lb

## 2014-09-23 DIAGNOSIS — S99912A Unspecified injury of left ankle, initial encounter: Secondary | ICD-10-CM | POA: Diagnosis not present

## 2014-09-23 NOTE — Progress Notes (Signed)
Patient ID: Susan Mcneil, female   DOB: 1946-04-09, 69 y.o.   MRN: [id number]  Chief Complaint  Patient presents with  . Ankle Injury    Left ankle fracture, DOI 09-20-14. Referred by Dr. Sallee Lange.     Susan Mcneil is a 69 y.o. female.   HPI 69 year old female previous distal fibular fracture left ankle treated closed. Presents after falling on May 14 and twisting her left ankle. Complains of pain swelling some aching. Her pain is rated 6 out of 10 but only requiring Aleve and rest to get relief she's not wearing any immobilization devices. Her x-ray was reviewed and I read this as a distal fibular avulsion type fracture. Review of Systems She reports numbness and tingling some redness in the skin some ankle leg edema and otherwise normal review of systems  Past Medical History  Diagnosis Date  . HTN (hypertension)     cholesterol & thyroid  . Macular degeneration   . Reflux gastritis   . Thyroid disorder   . Osteoporosis   . Hypothyroidism   . Fatty liver   . Osteopenia   . Macular degeneration, bilateral     Past Surgical History  Procedure Laterality Date  . Total abdominal hysterectomy w/ bilateral salpingoophorectomy    . Diagnostic laparoscopy    . Abdominal hysterectomy   patial, pre-uterine cancer, 1974    Family History  Problem Relation Age of Onset  . Cancer Mother   . Stroke Father     Social History History  Substance Use Topics  . Smoking status: Former Smoker    Quit date: 12/07/2000  . Smokeless tobacco: Not on file  . Alcohol Use: No    No Known Allergies  Current Outpatient Prescriptions  Medication Sig Dispense Refill  . Calcium Citrate (CITRACAL PO) Take 2 tablets by mouth daily.    Marland Kitchen levothyroxine (SYNTHROID, LEVOTHROID) 100 MCG tablet Take 1 tablet (100 mcg total) by mouth daily. 90 tablet 1  . lisinopril (PRINIVIL,ZESTRIL) 10 MG tablet Take 1 tablet (10 mg total) by mouth daily. 90 tablet 1  . Multiple Vitamins-Minerals (MH MACULAR  HEALTH PO) Take 1 tablet by mouth daily after lunch.    . rosuvastatin (CRESTOR) 10 MG tablet Take 1 tablet (10 mg total) by mouth at bedtime. 30 tablet 5  . TRAVATAN Z 0.004 % SOLN ophthalmic solution   0  . UNKNOWN TO PATIENT Injection for macular degeneration     No current facility-administered medications for this visit.       Physical Exam Blood pressure 161/82, height 5\' 4"  (1.626 m), weight 177 lb (80.287 kg). Physical Exam The patient is well developed well nourished and well groomed. Orientation to person place and time is normal  Mood is pleasant. Ambulatory status  She is walking with a pair of proximal no immobilization device. She has painful range of motion of the ankle stable drawer test tenderness at the tip of the fibula no ankle instability with inversion. She does have some inversion pain motor exam is normal skin is warm dry and intact she has good sensation distally and normal pulses  Data Reviewed Independent x-ray reviewed distal fibular avulsion fracture left ankle  Assessment Encounter Diagnosis  Name Primary?  Marland Kitchen Ankle injury, left, initial encounter Yes    Plan ASO brace return in 4 weeks no x-ray needed

## 2014-09-23 NOTE — Patient Instructions (Signed)
Wear brace for 4 weeks.   Follow up in 4 weeks.

## 2014-10-08 ENCOUNTER — Ambulatory Visit (INDEPENDENT_AMBULATORY_CARE_PROVIDER_SITE_OTHER): Payer: Medicare Other | Admitting: Family Medicine

## 2014-10-08 ENCOUNTER — Encounter: Payer: Self-pay | Admitting: Family Medicine

## 2014-10-08 VITALS — BP 118/82 | Ht 64.0 in | Wt 177.0 lb

## 2014-10-08 DIAGNOSIS — I10 Essential (primary) hypertension: Secondary | ICD-10-CM

## 2014-10-08 DIAGNOSIS — E038 Other specified hypothyroidism: Secondary | ICD-10-CM | POA: Diagnosis not present

## 2014-10-08 DIAGNOSIS — E039 Hypothyroidism, unspecified: Secondary | ICD-10-CM | POA: Diagnosis not present

## 2014-10-08 NOTE — Patient Instructions (Signed)
Use the brace for 2 more weeks  Labs and OV in 6 months we will send you a card  Keep all meds as is

## 2014-10-08 NOTE — Progress Notes (Signed)
   Subjective:    Patient ID: Susan Mcneil, female    DOB: [birth year], 69 y.o.   MRN: [id number]  HPIFollow up left ankle injury. Pt saw Dr. Aline Brochure. Pt has a boot to wear. She does not have to follow back up with him unless she is having problems. Pt states ankle is better.   Last med check in April. Lisinopril was doubled. Pt checks bp at home. Systolic range in the 257'D and diastolic in the 05'X.   Pt had bloodwork for TSH done this am.   Pt states no other concerns today.   Patient does try to eat healthy does not take medication. Denies any side effects or problems currently still having some soreness in the ankle  Review of Systems  Constitutional: Negative for activity change, appetite change and fatigue.  HENT: Negative for congestion.   Respiratory: Negative for cough.   Cardiovascular: Negative for chest pain.  Gastrointestinal: Negative for abdominal pain.  Endocrine: Negative for polydipsia and polyphagia.  Neurological: Negative for weakness.  Psychiatric/Behavioral: Negative for confusion.       Objective:   Physical Exam  Constitutional: She appears well-nourished. No distress.  Cardiovascular: Normal rate, regular rhythm and normal heart sounds.   No murmur heard. Pulmonary/Chest: Effort normal and breath sounds normal. No respiratory distress.  Musculoskeletal: She exhibits no edema.  Lymphadenopathy:    She has no cervical adenopathy.  Neurological: She is alert. She exhibits normal muscle tone.  Psychiatric: Her behavior is normal.  Vitals reviewed.         Assessment & Plan:  Ankle fracture should gradually heal up Blood pressure recheck looks good continue current measures Follow-up in 6 months with lab work Await thyroid testing that was done earlier today continue medication Follow-up sooner if any problems

## 2014-10-09 ENCOUNTER — Other Ambulatory Visit: Payer: Self-pay | Admitting: *Deleted

## 2014-10-09 DIAGNOSIS — E039 Hypothyroidism, unspecified: Secondary | ICD-10-CM

## 2014-10-09 LAB — TSH: TSH: 0.032 u[IU]/mL — AB (ref 0.450–4.500)

## 2014-10-09 MED ORDER — LEVOTHYROXINE SODIUM 75 MCG PO TABS: 75.0000 ug | ORAL_TABLET | Freq: Every day | ORAL | Status: DC

## 2014-10-13 ENCOUNTER — Ambulatory Visit: Payer: Medicare Other | Admitting: Nurse Practitioner

## 2014-10-17 DIAGNOSIS — H2511 Age-related nuclear cataract, right eye: Secondary | ICD-10-CM | POA: Diagnosis not present

## 2014-10-17 DIAGNOSIS — H25011 Cortical age-related cataract, right eye: Secondary | ICD-10-CM | POA: Diagnosis not present

## 2014-10-17 DIAGNOSIS — H4011X0 Primary open-angle glaucoma, stage unspecified: Secondary | ICD-10-CM | POA: Diagnosis not present

## 2014-10-21 ENCOUNTER — Ambulatory Visit: Payer: Medicare Other | Admitting: Orthopedic Surgery

## 2014-10-28 ENCOUNTER — Encounter: Payer: Self-pay | Admitting: Orthopedic Surgery

## 2014-10-28 ENCOUNTER — Ambulatory Visit (INDEPENDENT_AMBULATORY_CARE_PROVIDER_SITE_OTHER): Payer: Medicare Other | Admitting: Orthopedic Surgery

## 2014-10-28 VITALS — BP 140/70 | Ht 64.0 in | Wt 177.0 lb

## 2014-10-28 DIAGNOSIS — S99912D Unspecified injury of left ankle, subsequent encounter: Secondary | ICD-10-CM

## 2014-10-28 NOTE — Progress Notes (Signed)
Patient ID: Susan Mcneil, female   DOB: 1945-10-21, 69 y.o.   MRN: [id number] Chief Complaint  Patient presents with  . Follow-up    4 week follow up left ankle fx, DOI 09/22/14    Susan Mcneil had a distal fibular avulsion fracture which is more like an ankle type fracture she wore an ASO brace for 4 weeks comes in for reevaluation she says her ankle feels better she's walking normally  Her exam is benign  She is discharged follow-up as needed

## 2014-10-30 DIAGNOSIS — H3532 Exudative age-related macular degeneration: Secondary | ICD-10-CM | POA: Diagnosis not present

## 2014-11-27 DIAGNOSIS — Z803 Family history of malignant neoplasm of breast: Secondary | ICD-10-CM | POA: Diagnosis not present

## 2014-11-27 DIAGNOSIS — Z1231 Encounter for screening mammogram for malignant neoplasm of breast: Secondary | ICD-10-CM | POA: Diagnosis not present

## 2014-12-01 ENCOUNTER — Other Ambulatory Visit: Payer: Self-pay | Admitting: Nurse Practitioner

## 2014-12-11 ENCOUNTER — Encounter: Payer: Self-pay | Admitting: Family Medicine

## 2014-12-18 DIAGNOSIS — H3532 Exudative age-related macular degeneration: Secondary | ICD-10-CM | POA: Diagnosis not present

## 2014-12-24 ENCOUNTER — Encounter (INDEPENDENT_AMBULATORY_CARE_PROVIDER_SITE_OTHER): Payer: Self-pay

## 2014-12-24 ENCOUNTER — Encounter (INDEPENDENT_AMBULATORY_CARE_PROVIDER_SITE_OTHER): Payer: Self-pay | Admitting: *Deleted

## 2015-01-01 ENCOUNTER — Other Ambulatory Visit (INDEPENDENT_AMBULATORY_CARE_PROVIDER_SITE_OTHER): Payer: Self-pay | Admitting: *Deleted

## 2015-01-01 DIAGNOSIS — Z86010 Personal history of colonic polyps: Secondary | ICD-10-CM

## 2015-01-06 ENCOUNTER — Other Ambulatory Visit: Payer: Self-pay | Admitting: Nurse Practitioner

## 2015-01-26 DIAGNOSIS — E039 Hypothyroidism, unspecified: Secondary | ICD-10-CM | POA: Diagnosis not present

## 2015-01-27 LAB — TSH: TSH: 0.036 u[IU]/mL — ABNORMAL LOW (ref 0.450–4.500)

## 2015-02-05 DIAGNOSIS — H3532 Exudative age-related macular degeneration: Secondary | ICD-10-CM | POA: Diagnosis not present

## 2015-02-18 ENCOUNTER — Telehealth (INDEPENDENT_AMBULATORY_CARE_PROVIDER_SITE_OTHER): Payer: Self-pay | Admitting: *Deleted

## 2015-02-18 DIAGNOSIS — Z1211 Encounter for screening for malignant neoplasm of colon: Secondary | ICD-10-CM

## 2015-02-18 NOTE — Telephone Encounter (Signed)
Patient needs suprep 

## 2015-02-19 MED ORDER — SUPREP BOWEL PREP KIT 17.5-3.13-1.6 GM/177ML PO SOLN: 1.0000 | Freq: Once | ORAL | Status: DC

## 2015-03-03 ENCOUNTER — Telehealth (INDEPENDENT_AMBULATORY_CARE_PROVIDER_SITE_OTHER): Payer: Self-pay | Admitting: *Deleted

## 2015-03-03 NOTE — Telephone Encounter (Signed)
Referring MD/PCP: scott luking   Procedure: tcs  Reason/Indication:  Hx polyps  Has patient had this procedure before?  Yes, 2009 -- scanned  If so, when, by whom and where?    Is there a family history of colon cancer?  no  Who?  What age when diagnosed?    Is patient diabetic?   no      Does patient have prosthetic heart valve or mechanical valve?  no  Do you have a pacemaker?  no  Has patient ever had endocarditis? o  Has patient had joint replacement within last 12 months?  no  Does patient tend to be constipated or take laxatives? no  Does patient have a history of alcohol/drug use?  no  Is patient on Coumadin, Plavix and/or Aspirin? no  Medications: levothyroxine 0.075 mg daily, crestor 10 mg daily, lisinopril 10 mg daily  Allergies: nkda  Medication Adjustment:   Procedure date & time: 04/01/15 at 1030

## 2015-03-03 NOTE — Telephone Encounter (Signed)
agree

## 2015-04-01 ENCOUNTER — Ambulatory Visit (HOSPITAL_COMMUNITY)
Admission: RE | Admit: 2015-04-01 | Discharge: 2015-04-01 | Disposition: A | Discharge status: Home / Self Care | Payer: Medicare Other | Source: Ambulatory Visit | Attending: Internal Medicine | Admitting: Internal Medicine

## 2015-04-01 ENCOUNTER — Encounter (HOSPITAL_COMMUNITY): Payer: Self-pay | Admitting: *Deleted

## 2015-04-01 ENCOUNTER — Encounter (HOSPITAL_COMMUNITY)
Admission: RE | Disposition: A | Discharge status: Home / Self Care | Payer: Self-pay | Source: Ambulatory Visit | Attending: Internal Medicine

## 2015-04-01 DIAGNOSIS — D123 Benign neoplasm of transverse colon: Secondary | ICD-10-CM | POA: Insufficient documentation

## 2015-04-01 DIAGNOSIS — Z87891 Personal history of nicotine dependence: Secondary | ICD-10-CM | POA: Diagnosis not present

## 2015-04-01 DIAGNOSIS — M810 Age-related osteoporosis without current pathological fracture: Secondary | ICD-10-CM | POA: Diagnosis not present

## 2015-04-01 DIAGNOSIS — K648 Other hemorrhoids: Secondary | ICD-10-CM

## 2015-04-01 DIAGNOSIS — Z90710 Acquired absence of both cervix and uterus: Secondary | ICD-10-CM | POA: Insufficient documentation

## 2015-04-01 DIAGNOSIS — K760 Fatty (change of) liver, not elsewhere classified: Secondary | ICD-10-CM | POA: Diagnosis not present

## 2015-04-01 DIAGNOSIS — E039 Hypothyroidism, unspecified: Secondary | ICD-10-CM | POA: Diagnosis not present

## 2015-04-01 DIAGNOSIS — Z79899 Other long term (current) drug therapy: Secondary | ICD-10-CM | POA: Diagnosis not present

## 2015-04-01 DIAGNOSIS — K644 Residual hemorrhoidal skin tags: Secondary | ICD-10-CM | POA: Insufficient documentation

## 2015-04-01 DIAGNOSIS — Z823 Family history of stroke: Secondary | ICD-10-CM | POA: Diagnosis not present

## 2015-04-01 DIAGNOSIS — Z86010 Personal history of colonic polyps: Secondary | ICD-10-CM | POA: Diagnosis not present

## 2015-04-01 DIAGNOSIS — K5730 Diverticulosis of large intestine without perforation or abscess without bleeding: Secondary | ICD-10-CM | POA: Insufficient documentation

## 2015-04-01 DIAGNOSIS — Z800 Family history of malignant neoplasm of digestive organs: Secondary | ICD-10-CM | POA: Diagnosis not present

## 2015-04-01 DIAGNOSIS — I10 Essential (primary) hypertension: Secondary | ICD-10-CM | POA: Diagnosis not present

## 2015-04-01 DIAGNOSIS — Z1211 Encounter for screening for malignant neoplasm of colon: Secondary | ICD-10-CM | POA: Insufficient documentation

## 2015-04-01 DIAGNOSIS — Z8371 Family history of colonic polyps: Secondary | ICD-10-CM | POA: Insufficient documentation

## 2015-04-01 HISTORY — PX: COLONOSCOPY: SHX5424

## 2015-04-01 SURGERY — COLONOSCOPY
Anesthesia: Moderate Sedation

## 2015-04-01 MED ORDER — MEPERIDINE HCL 50 MG/ML IJ SOLN
INTRAMUSCULAR | Status: AC
  Filled 2015-04-01: qty 1

## 2015-04-01 MED ORDER — SIMETHICONE 40 MG/0.6ML PO SUSP
ORAL | Status: DC | PRN
  Administered 2015-04-01: 250 mL

## 2015-04-01 MED ORDER — SODIUM CHLORIDE 0.9 % IV SOLN
INTRAVENOUS | Status: DC
  Administered 2015-04-01: 1000 mL via INTRAVENOUS

## 2015-04-01 MED ORDER — MIDAZOLAM HCL 5 MG/5ML IJ SOLN
INTRAMUSCULAR | Status: DC | PRN
  Administered 2015-04-01: 2 mg via INTRAVENOUS
  Administered 2015-04-01: 1 mg via INTRAVENOUS
  Administered 2015-04-01: 2 mg via INTRAVENOUS
  Administered 2015-04-01: 1 mg via INTRAVENOUS

## 2015-04-01 MED ORDER — MEPERIDINE HCL 50 MG/ML IJ SOLN
INTRAMUSCULAR | Status: DC | PRN
  Administered 2015-04-01 (×2): 25 mg via INTRAVENOUS

## 2015-04-01 MED ORDER — MIDAZOLAM HCL 5 MG/5ML IJ SOLN
INTRAMUSCULAR | Status: AC
  Filled 2015-04-01: qty 10

## 2015-04-01 NOTE — Op Note (Signed)
COLONOSCOPY PROCEDURE REPORT  PATIENT:  Susan Mcneil  MR#:  [id number] Birthdate:  03-17-1946, 69 y.o., female Endoscopist:  Dr. Rogene Houston, MD Referred By:  Dr. Sallee Lange, MD Procedure Date: 04/01/2015  Procedure:   COLONOSCOPY  Indications:   Patient is 69 year old Caucasian female with history of colonic polyps in here for surveillance colonoscopy.   No polyp was found on last colonoscopy. Family history significant for colonic polyps in mother and colon carcinoma in maternal uncle who was in his 70s at the time of diagnosis.   Informed Consent:  The procedure and risks were reviewed with the patient and informed consent was obtained.  Medications:  Demerol 50 mg IV Versed 6 mg IV  Description of procedure:  After a digital rectal exam was performed, that colonoscope was advanced from the anus through the rectum and colon to the area of the cecum, ileocecal valve and appendiceal orifice. The cecum was deeply intubated. These structures were well-seen and photographed for the record. From the level of the cecum and ileocecal valve, the scope was slowly and cautiously withdrawn. The mucosal surfaces were carefully surveyed utilizing scope tip to flexion to facilitate fold flattening as needed. The scope was pulled down into the rectum where a thorough exam including retroflexion was performed.  Findings:   Prep excellent. Two small polyps noted at hepatic flexure. Both of these polyps ablated via cold biopsy and submitted together.  Few small diverticula noted at sigmoid colon.  Normal rectal mucosa.  Small hemorrhoids below the dentate line.   Therapeutic/Diagnostic Maneuvers Performed:  See above  Complications:   none  EBL: Minimal  Cecal Withdrawal Time:   11 minutes  Impression:  Examination performed to cecum. Small polyps ablated via cold biopsy from hepatic flexure and submitted together. Mild sigmoid colon diverticulosis. Small external  hemorrhoids.  Recommendations:  Standard instructions given.  High fiber diet. I will contact patient with biopsy results and further recommendations.  Jeffry Vogelsang U  04/01/2015 11:09 AM  CC: Dr. Sallee Lange, MD & Dr. Rayne Du ref. provider found

## 2015-04-01 NOTE — Discharge Instructions (Signed)
Resume usual medications and high fiber diet.  No driving for 24 hours.  Physician will call with biopsy results   Colonoscopy, Care After These instructions give you information on caring for yourself after your procedure. Your doctor may also give you more specific instructions. Call your doctor if you have any problems or questions after your procedure. HOME CARE  Do not drive for 24 hours.  Do not sign important papers or use machinery for 24 hours.  You may shower.  You may go back to your usual activities, but go slower for the first 24 hours.  Take rest breaks often during the first 24 hours.  Walk around or use warm packs on your belly (abdomen) if you have belly cramping or gas.  Drink enough fluids to keep your pee (urine) clear or pale yellow.  Resume your normal diet. Avoid heavy or fried foods.  Avoid drinking alcohol for 24 hours or as told by your doctor.  Only take medicines as told by your doctor. If a tissue sample (biopsy) was taken during the procedure:   Do not take aspirin or blood thinners for 7 days, or as told by your doctor.  Do not drink alcohol for 7 days, or as told by your doctor.  Eat soft foods for the first 24 hours. GET HELP IF: You still have a small amount of blood in your poop (stool) 2-3 days after the procedure. GET HELP RIGHT AWAY IF:  You have more than a small amount of blood in your poop.  You see clumps of tissue (blood clots) in your poop.  Your belly is puffy (swollen).  You feel sick to your stomach (nauseous) or throw up (vomit).  You have a fever.  You have belly pain that gets worse and medicine does not help. MAKE SURE YOU:  Understand these instructions.  Will watch your condition.  Will get help right away if you are not doing well or get worse.   This information is not intended to replace advice given to you by your health care provider. Make sure you discuss any questions you have with your health care  provider.   Document Released: 05/28/2010 Document Revised: 04/30/2013 Document Reviewed: 12/31/2012 Elsevier Interactive Patient Education [employer].   Diverticulosis Diverticulosis is the condition that develops when small pouches (diverticula) form in the wall of your colon. Your colon, or large intestine, is where water is absorbed and stool is formed. The pouches form when the inside layer of your colon pushes through weak spots in the outer layers of your colon. CAUSES  No one knows exactly what causes diverticulosis. RISK FACTORS  Being older than 56. Your risk for this condition increases with age. Diverticulosis is rare in people younger than 40 years. By age 71, almost everyone has it.  Eating a low-fiber diet.  Being frequently constipated.  Being overweight.  Not getting enough exercise.  Smoking.  Taking over-the-counter pain medicines, like aspirin and ibuprofen. SYMPTOMS  Most people with diverticulosis do not have symptoms. DIAGNOSIS  Because diverticulosis often has no symptoms, health care providers often discover the condition during an exam for other colon problems. In many cases, a health care provider will diagnose diverticulosis while using a flexible scope to examine the colon (colonoscopy). TREATMENT  If you have never developed an infection related to diverticulosis, you may not need treatment. If you have had an infection before, treatment may include:  Eating more fruits, vegetables, and grains.  Taking a fiber  supplement.  Taking a live bacteria supplement (probiotic).  Taking medicine to relax your colon. HOME CARE INSTRUCTIONS   Drink at least 6-8 glasses of water each day to prevent constipation.  Try not to strain when you have a bowel movement.  Keep all follow-up appointments. If you have had an infection before:  Increase the fiber in your diet as directed by your health care provider or dietitian.  Take a dietary fiber  supplement if your health care provider approves.  Only take medicines as directed by your health care provider. SEEK MEDICAL CARE IF:   You have abdominal pain.  You have bloating.  You have cramps.  You have not gone to the bathroom in 3 days. SEEK IMMEDIATE MEDICAL CARE IF:   Your pain gets worse.  Yourbloating becomes very bad.  You have a fever or chills, and your symptoms suddenly get worse.  You begin vomiting.  You have bowel movements that are bloody or black. MAKE SURE YOU:  Understand these instructions.  Will watch your condition.  Will get help right away if you are not doing well or get worse.   This information is not intended to replace advice given to you by your health care provider. Make sure you discuss any questions you have with your health care provider.   Document Released: 01/21/2004 Document Revised: 04/30/2013 Document Reviewed: 03/20/2013 Elsevier Interactive Patient Education 2016 Elsevier Inc.  Colon Polyps Polyps are lumps of extra tissue growing inside the body. Polyps can grow in the large intestine (colon). Most colon polyps are noncancerous (benign). However, some colon polyps can become cancerous over time. Polyps that are larger than a pea may be harmful. To be safe, caregivers remove and test all polyps. CAUSES  Polyps form when mutations in the genes cause your cells to grow and divide even though no more tissue is needed. RISK FACTORS There are a number of risk factors that can increase your chances of getting colon polyps. They include:  Being older than 50 years.  Family history of colon polyps or colon cancer.  Long-term colon diseases, such as colitis or Crohn disease.  Being overweight.  Smoking.  Being inactive.  Drinking too much alcohol. SYMPTOMS  Most small polyps do not cause symptoms. If symptoms are present, they may include:  Blood in the stool. The stool may look dark red or black.  Constipation or  diarrhea that lasts longer than 1 week. DIAGNOSIS People often do not know they have polyps until their caregiver finds them during a regular checkup. Your caregiver can use 4 tests to check for polyps:  Digital rectal exam. The caregiver wears gloves and feels inside the rectum. This test would find polyps only in the rectum.  Barium enema. The caregiver puts a liquid called barium into your rectum before taking X-rays of your colon. Barium makes your colon look white. Polyps are dark, so they are easy to see in the X-ray pictures.  Sigmoidoscopy. A thin, flexible tube (sigmoidoscope) is placed into your rectum. The sigmoidoscope has a light and tiny camera in it. The caregiver uses the sigmoidoscope to look at the last third of your colon.  Colonoscopy. This test is like sigmoidoscopy, but the caregiver looks at the entire colon. This is the most common method for finding and removing polyps. TREATMENT  Any polyps will be removed during a sigmoidoscopy or colonoscopy. The polyps are then tested for cancer. PREVENTION  To help lower your risk of getting more colon polyps:  Eat plenty of fruits and vegetables. Avoid eating fatty foods.  Do not smoke.  Avoid drinking alcohol.  Exercise every day.  Lose weight if recommended by your caregiver.  Eat plenty of calcium and folate. Foods that are rich in calcium include milk, cheese, and broccoli. Foods that are rich in folate include chickpeas, kidney beans, and spinach. HOME CARE INSTRUCTIONS Keep all follow-up appointments as directed by your caregiver. You may need periodic exams to check for polyps. SEEK MEDICAL CARE IF: You notice bleeding during a bowel movement.   This information is not intended to replace advice given to you by your health care provider. Make sure you discuss any questions you have with your health care provider.   Document Released: 01/20/2004 Document Revised: 05/16/2014 Document Reviewed: 07/05/2011 Elsevier  Interactive Patient Education 2016 Elsevier Inc.   High-Fiber Diet Fiber, also called dietary fiber, is a type of carbohydrate found in fruits, vegetables, whole grains, and beans. A high-fiber diet can have many health benefits. Your health care provider may recommend a high-fiber diet to help:  Prevent constipation. Fiber can make your bowel movements more regular.  Lower your cholesterol.  Relieve hemorrhoids, uncomplicated diverticulosis, or irritable bowel syndrome.  Prevent overeating as part of a weight-loss plan.  Prevent heart disease, type 2 diabetes, and certain cancers. WHAT IS MY PLAN? The recommended daily intake of fiber includes:  38 grams for men under age 80.  33 grams for men over age 78.  63 grams for women under age 13.  71 grams for women over age 92. You can get the recommended daily intake of dietary fiber by eating a variety of fruits, vegetables, grains, and beans. Your health care provider may also recommend a fiber supplement if it is not possible to get enough fiber through your diet. WHAT DO I NEED TO KNOW ABOUT A HIGH-FIBER DIET?  Fiber supplements have not been widely studied for their effectiveness, so it is better to get fiber through food sources.  Always check the fiber content on thenutrition facts label of any prepackaged food. Look for foods that contain at least 5 grams of fiber per serving.  Ask your dietitian if you have questions about specific foods that are related to your condition, especially if those foods are not listed in the following section.  Increase your daily fiber consumption gradually. Increasing your intake of dietary fiber too quickly may cause bloating, cramping, or gas.  Drink plenty of water. Water helps you to digest fiber. WHAT FOODS CAN I EAT? Grains Whole-grain breads. Multigrain cereal. Oats and oatmeal. Brown rice. Barley. Bulgur wheat. Markesan. Bran muffins. Popcorn. Rye wafer crackers. Vegetables Sweet  potatoes. Spinach. Kale. Artichokes. Cabbage. Broccoli. Green peas. Carrots. Squash. Fruits Berries. Pears. Apples. Oranges. Avocados. Prunes and raisins. Dried figs. Meats and Other Protein Sources Navy, kidney, pinto, and soy beans. Split peas. Lentils. Nuts and seeds. Dairy Fiber-fortified yogurt. Beverages Fiber-fortified soy milk. Fiber-fortified orange juice. Other Fiber bars. The items listed above may not be a complete list of recommended foods or beverages. Contact your dietitian for more options. WHAT FOODS ARE NOT RECOMMENDED? Grains White bread. Pasta made with refined flour. White rice. Vegetables Fried potatoes. Canned vegetables. Well-cooked vegetables.  Fruits Fruit juice. Cooked, strained fruit. Meats and Other Protein Sources Fatty cuts of meat. Fried Sales executive or fried fish. Dairy Milk. Yogurt. Cream cheese. Sour cream. Beverages Soft drinks. Other Cakes and pastries. Butter and oils. The items listed above may not be a complete list of  foods and beverages to avoid. Contact your dietitian for more information. WHAT ARE SOME TIPS FOR INCLUDING HIGH-FIBER FOODS IN MY DIET?  Eat a wide variety of high-fiber foods.  Make sure that half of all grains consumed each day are whole grains.  Replace breads and cereals made from refined flour or white flour with whole-grain breads and cereals.  Replace white rice with brown rice, bulgur wheat, or millet.  Start the day with a breakfast that is high in fiber, such as a cereal that contains at least 5 grams of fiber per serving.  Use beans in place of meat in soups, salads, or pasta.  Eat high-fiber snacks, such as berries, raw vegetables, nuts, or popcorn.   This information is not intended to replace advice given to you by your health care provider. Make sure you discuss any questions you have with your health care provider.   Document Released: 04/25/2005 Document Revised: 05/16/2014 Document Reviewed:  10/08/2013 Elsevier Interactive Patient Education [employer].

## 2015-04-01 NOTE — H&P (Signed)
Susan Mcneil is an 69 y.o. female.   Chief Complaint:  Patient is here for colonoscopy. HPI:   Patient is 69 year old Caucasian female who is in for surveillance colonoscopy. She had a polyp or polyps removed in the worse exam but none percent second exam. She denies dominant pain change in bowel habits or rectal bleeding.  Family history significant for CRC in maternal uncle who is in his 81s at the time of diagnosis an colonic polyps in her mother.  Past Medical History  Diagnosis Date  . HTN (hypertension)     cholesterol & thyroid  . Macular degeneration   . Reflux gastritis   . Thyroid disorder   . Osteoporosis   . Hypothyroidism   . Fatty liver   . Osteopenia   . Macular degeneration, bilateral     Past Surgical History  Procedure Laterality Date  . Total abdominal hysterectomy w/ bilateral salpingoophorectomy    . Diagnostic laparoscopy    . Abdominal hysterectomy   patial, pre-uterine cancer, 1974    Family History  Problem Relation Age of Onset  . Cancer Mother   . Stroke Father    Social History:  reports that she quit smoking about 14 years ago. She does not have any smokeless tobacco history on file. She reports that she does not drink alcohol or use illicit drugs.  Allergies: No Known Allergies  Medications Prior to Admission  Medication Sig Dispense Refill  . Calcium Citrate (CITRACAL PO) Take 2 tablets by mouth daily.    Marland Kitchen levothyroxine (SYNTHROID, LEVOTHROID) 75 MCG tablet TAKE 1 TABLET BY MOUTH DAILY 90 tablet 1  . lisinopril (PRINIVIL,ZESTRIL) 10 MG tablet Take 1 tablet (10 mg total) by mouth daily. 90 tablet 1  . Multiple Vitamins-Minerals (MH MACULAR HEALTH PO) Take 1 tablet by mouth 2 (two) times daily.     . rosuvastatin (CRESTOR) 10 MG tablet Take 1 tablet (10 mg total) by mouth at bedtime. 30 tablet 5  . SUPREP BOWEL PREP SOLN Take 1 kit by mouth once. 1 Bottle 0  . UNKNOWN TO PATIENT Place into the right eye every 8 (eight) weeks. Injection for  macular degeneration      No results found for this or any previous visit (from the past 77 hour(s)). No results found.  ROS  Blood pressure 135/79, pulse 67, temperature 98.3 F (36.8 C), temperature source Oral, resp. rate 17, height 5' 4" (1.626 m), weight 171 lb (77.565 kg), SpO2 96 %. Physical Exam  Constitutional: She appears well-developed and well-nourished.  HENT:  Mouth/Throat: Oropharynx is clear and moist.  Eyes: Conjunctivae are normal. No scleral icterus.  Neck: No thyromegaly present.  Cardiovascular: Normal rate, regular rhythm and normal heart sounds.   No murmur heard. Respiratory: Effort normal and breath sounds normal.  GI: Soft. She exhibits no distension and no mass. There is no tenderness.  Musculoskeletal: She exhibits no edema.  Lymphadenopathy:    She has no cervical adenopathy.  Neurological: She is alert.  Skin: Skin is warm and dry.     Assessment/Plan: History of colonic polyps and family history of CRC in second-degree relative and polyps in first-degree relative.  Surveillance colonoscopy.  REHMAN,NAJEEB U 04/01/2015, 10:24 AM

## 2015-04-03 ENCOUNTER — Other Ambulatory Visit: Payer: Self-pay | Admitting: Family Medicine

## 2015-04-06 ENCOUNTER — Encounter: Payer: Self-pay | Admitting: Family Medicine

## 2015-04-06 ENCOUNTER — Ambulatory Visit (INDEPENDENT_AMBULATORY_CARE_PROVIDER_SITE_OTHER): Payer: Medicare Other | Admitting: Family Medicine

## 2015-04-06 VITALS — BP 138/86 | Ht 64.0 in | Wt 177.0 lb

## 2015-04-06 DIAGNOSIS — E876 Hypokalemia: Secondary | ICD-10-CM | POA: Diagnosis not present

## 2015-04-06 DIAGNOSIS — Z780 Asymptomatic menopausal state: Secondary | ICD-10-CM

## 2015-04-06 DIAGNOSIS — E038 Other specified hypothyroidism: Secondary | ICD-10-CM

## 2015-04-06 DIAGNOSIS — E785 Hyperlipidemia, unspecified: Secondary | ICD-10-CM | POA: Diagnosis not present

## 2015-04-06 DIAGNOSIS — H353211 Exudative age-related macular degeneration, right eye, with active choroidal neovascularization: Secondary | ICD-10-CM | POA: Diagnosis not present

## 2015-04-06 DIAGNOSIS — I10 Essential (primary) hypertension: Secondary | ICD-10-CM

## 2015-04-06 DIAGNOSIS — Z13820 Encounter for screening for osteoporosis: Secondary | ICD-10-CM | POA: Diagnosis not present

## 2015-04-06 NOTE — Progress Notes (Signed)
   Subjective:    Patient ID: Susan Mcneil, female    DOB: July 22, 1945, 69 y.o.   MRN: [id number]  Hypertension This is a chronic problem. The current episode started more than 1 year ago. Risk factors for coronary artery disease include dyslipidemia and post-menopausal state. Treatments tried: lisinopril. There are no compliance problems.    Patient states she's been taking 1 tablet daily recently of her thyroid medicine. Patient denies any compliance issues.   Review of Systems    patient denies any chest tightness pressure pain shortness breath nausea vomiting diarrhea Objective:   Physical Exam Lungs clear heart regular neck no masses extremities no edema skin warm dry blood pressure good       Assessment & Plan:  Patient does have memory dysfunction this seems to be stable not getting worse according to the patient  HTN good control continue current measures Hypothyroidism check lab work a need a new dose  Hyperlipidemia taking medication check lab work

## 2015-04-07 ENCOUNTER — Encounter (HOSPITAL_COMMUNITY): Payer: Self-pay | Admitting: Internal Medicine

## 2015-04-11 ENCOUNTER — Encounter (HOSPITAL_COMMUNITY): Payer: Self-pay | Admitting: Emergency Medicine

## 2015-04-11 ENCOUNTER — Emergency Department (HOSPITAL_COMMUNITY)
Admission: EM | Admit: 2015-04-11 | Discharge: 2015-04-11 | Disposition: A | Discharge status: Home / Self Care | Payer: Medicare Other | Attending: Emergency Medicine | Admitting: Emergency Medicine

## 2015-04-11 ENCOUNTER — Emergency Department (HOSPITAL_COMMUNITY): Payer: Medicare Other

## 2015-04-11 DIAGNOSIS — I10 Essential (primary) hypertension: Secondary | ICD-10-CM | POA: Diagnosis not present

## 2015-04-11 DIAGNOSIS — Z8669 Personal history of other diseases of the nervous system and sense organs: Secondary | ICD-10-CM | POA: Insufficient documentation

## 2015-04-11 DIAGNOSIS — E039 Hypothyroidism, unspecified: Secondary | ICD-10-CM | POA: Insufficient documentation

## 2015-04-11 DIAGNOSIS — N201 Calculus of ureter: Secondary | ICD-10-CM | POA: Diagnosis not present

## 2015-04-11 DIAGNOSIS — Z90710 Acquired absence of both cervix and uterus: Secondary | ICD-10-CM | POA: Diagnosis not present

## 2015-04-11 DIAGNOSIS — Z87891 Personal history of nicotine dependence: Secondary | ICD-10-CM | POA: Diagnosis not present

## 2015-04-11 DIAGNOSIS — Z8719 Personal history of other diseases of the digestive system: Secondary | ICD-10-CM | POA: Diagnosis not present

## 2015-04-11 DIAGNOSIS — Z79899 Other long term (current) drug therapy: Secondary | ICD-10-CM | POA: Diagnosis not present

## 2015-04-11 DIAGNOSIS — Z8739 Personal history of other diseases of the musculoskeletal system and connective tissue: Secondary | ICD-10-CM | POA: Insufficient documentation

## 2015-04-11 DIAGNOSIS — R109 Unspecified abdominal pain: Secondary | ICD-10-CM | POA: Diagnosis present

## 2015-04-11 LAB — BASIC METABOLIC PANEL
Anion gap: 11 (ref 5–15)
BUN: 18 mg/dL (ref 6–20)
CO2: 23 mmol/L (ref 22–32)
Calcium: 9.6 mg/dL (ref 8.9–10.3)
Chloride: 109 mmol/L (ref 101–111)
Creatinine, Ser: 0.83 mg/dL (ref 0.44–1.00)
GFR calc Af Amer: >60 mL/min (ref >60)
GFR calc non Af Amer: >60 mL/min (ref >60)
Glucose, Bld: 128 mg/dL — ABNORMAL HIGH (ref 65–99)
Potassium: 3.3 mmol/L — ABNORMAL LOW (ref 3.5–5.1)
Sodium: 143 mmol/L (ref 135–145)

## 2015-04-11 LAB — CBC WITH DIFFERENTIAL/PLATELET
Basophils Absolute: 0 10*3/uL (ref 0.0–0.1)
Basophils Relative: 0 %
Eosinophils Absolute: 0.2 10*3/uL (ref 0.0–0.7)
Eosinophils Relative: 2 %
HCT: 37.7 % (ref 36.0–46.0)
Hemoglobin: 12.3 g/dL (ref 12.0–15.0)
Lymphocytes Relative: 39 %
Lymphs Abs: 3.6 10*3/uL (ref 0.7–4.0)
MCH: 28.1 pg (ref 26.0–34.0)
MCHC: 32.6 g/dL (ref 30.0–36.0)
MCV: 86.3 fL (ref 78.0–100.0)
Monocytes Absolute: 0.7 10*3/uL (ref 0.1–1.0)
Monocytes Relative: 8 %
Neutro Abs: 4.7 10*3/uL (ref 1.7–7.7)
Neutrophils Relative %: 51 %
Platelets: 285 10*3/uL (ref 150–400)
RBC: 4.37 MIL/uL (ref 3.87–5.11)
RDW: 13.7 % (ref 11.5–15.5)
WBC: 9.2 10*3/uL (ref 4.0–10.5)

## 2015-04-11 MED ORDER — ONDANSETRON HCL 4 MG/2ML IJ SOLN
4.0000 mg | Freq: Once | INTRAMUSCULAR | Status: DC
  Filled 2015-04-11: qty 2

## 2015-04-11 MED ORDER — ONDANSETRON HCL 4 MG/2ML IJ SOLN
4.0000 mg | Freq: Once | INTRAMUSCULAR | Status: AC
  Administered 2015-04-11: 4 mg via INTRAVENOUS

## 2015-04-11 MED ORDER — HYDROMORPHONE HCL 1 MG/ML IJ SOLN
1.0000 mg | Freq: Once | INTRAMUSCULAR | Status: AC
  Administered 2015-04-11: 1 mg via INTRAVENOUS
  Filled 2015-04-11: qty 1

## 2015-04-11 MED ORDER — OXYCODONE-ACETAMINOPHEN 5-325 MG PO TABS: 1.0000 | ORAL_TABLET | ORAL | Status: DC | PRN

## 2015-04-11 MED ORDER — ONDANSETRON 8 MG PO TBDP
ORAL_TABLET | ORAL | Status: AC
  Filled 2015-04-11: qty 1

## 2015-04-11 MED ORDER — KETOROLAC TROMETHAMINE 30 MG/ML IJ SOLN
15.0000 mg | Freq: Once | INTRAMUSCULAR | Status: AC
  Administered 2015-04-11: 15 mg via INTRAVENOUS
  Filled 2015-04-11: qty 1

## 2015-04-11 MED ORDER — SODIUM CHLORIDE 0.9 % IV BOLUS (SEPSIS)
1000.0000 mL | Freq: Once | INTRAVENOUS | Status: AC
  Administered 2015-04-11: 1000 mL via INTRAVENOUS

## 2015-04-11 MED ORDER — ONDANSETRON HCL 4 MG PO TABS: 4.0000 mg | ORAL_TABLET | Freq: Four times a day (QID) | ORAL | Status: DC

## 2015-04-11 NOTE — ED Notes (Signed)
MD at bedside. 

## 2015-04-11 NOTE — ED Notes (Signed)
Pt states that her left side started hurting this evening.  States that she had a normal bowel movement this evening before pain started.

## 2015-04-11 NOTE — Discharge Instructions (Signed)
Kidney Stones  Kidney stones (urolithiasis) are solid masses that form inside your kidneys. The intense pain is caused by the stone moving through the kidney, ureter, bladder, and urethra (urinary tract). When the stone moves, the ureter starts to spasm around the stone. The stone is usually passed in your pee (urine).   HOME CARE  · Drink enough fluids to keep your pee clear or pale yellow. This helps to get the stone out.  · Take a 24-hour pee (urine) sample as told by your doctor. You may need to take another sample every 6-12 months.  · Strain all pee through the provided strainer. Do not pee without peeing through the strainer, not even once. If you pee the stone out, catch it in the strainer. The stone may be as small as a grain of salt. Take this to your doctor. This will help your doctor figure out what you can do to try to prevent more kidney stones.  · Only take medicine as told by your doctor.  · Make changes to your daily diet as told by your doctor. You may be told to:    Limit how much salt you eat.    Eat 5 or more servings of fruits and vegetables each day.    Limit how much meat, poultry, fish, and eggs you eat.  · Keep all follow-up visits as told by your doctor. This is important.  · Get follow-up X-rays as told by your doctor.  GET HELP IF:  You have pain that gets worse even if you have been taking pain medicine.  GET HELP RIGHT AWAY IF:   · Your pain does not get better with medicine.  · You have a fever or shaking chills.  · Your pain increases and gets worse over 18 hours.  · You have new belly (abdominal) pain.  · You feel faint or pass out.  · You are unable to pee.     This information is not intended to replace advice given to you by your health care provider. Make sure you discuss any questions you have with your health care provider.     Document Released: 10/12/2007 Document Revised: 01/14/2015 Document Reviewed: 09/26/2012  Elsevier Interactive Patient Education ©2016 Elsevier  Inc[employer]

## 2015-04-11 NOTE — ED Notes (Signed)
After removing IV and getting pt ready for discharge, she began vomiting. EDP informed and pt given zofran 4mg  odt

## 2015-04-15 ENCOUNTER — Encounter: Payer: Self-pay | Admitting: Family Medicine

## 2015-04-15 ENCOUNTER — Ambulatory Visit (HOSPITAL_COMMUNITY)
Admission: RE | Admit: 2015-04-15 | Discharge: 2015-04-15 | Disposition: A | Discharge status: Home / Self Care | Payer: Medicare Other | Source: Ambulatory Visit | Attending: Family Medicine | Admitting: Family Medicine

## 2015-04-15 DIAGNOSIS — E785 Hyperlipidemia, unspecified: Secondary | ICD-10-CM | POA: Diagnosis not present

## 2015-04-15 DIAGNOSIS — Z13820 Encounter for screening for osteoporosis: Secondary | ICD-10-CM | POA: Diagnosis not present

## 2015-04-15 DIAGNOSIS — N1330 Unspecified hydronephrosis: Secondary | ICD-10-CM | POA: Diagnosis not present

## 2015-04-15 DIAGNOSIS — M8580 Other specified disorders of bone density and structure, unspecified site: Secondary | ICD-10-CM | POA: Insufficient documentation

## 2015-04-15 DIAGNOSIS — Z780 Asymptomatic menopausal state: Secondary | ICD-10-CM | POA: Insufficient documentation

## 2015-04-15 DIAGNOSIS — N201 Calculus of ureter: Secondary | ICD-10-CM | POA: Diagnosis not present

## 2015-04-15 DIAGNOSIS — I10 Essential (primary) hypertension: Secondary | ICD-10-CM | POA: Diagnosis not present

## 2015-04-15 DIAGNOSIS — E038 Other specified hypothyroidism: Secondary | ICD-10-CM | POA: Diagnosis not present

## 2015-04-15 DIAGNOSIS — M8589 Other specified disorders of bone density and structure, multiple sites: Secondary | ICD-10-CM | POA: Diagnosis not present

## 2015-04-16 LAB — LIPID PANEL
CHOL/HDL RATIO: 2.4 ratio (ref 0.0–4.4)
CHOLESTEROL TOTAL: 140 mg/dL (ref 100–199)
HDL: 58 mg/dL (ref >39)
LDL CALC: 59 mg/dL (ref 0–99)
Triglycerides: 114 mg/dL (ref 0–149)
VLDL CHOLESTEROL CAL: 23 mg/dL (ref 5–40)

## 2015-04-16 LAB — BASIC METABOLIC PANEL
BUN/Creatinine Ratio: 10 — ABNORMAL LOW (ref 11–26)
BUN: 13 mg/dL (ref 8–27)
CHLORIDE: 94 mmol/L — AB (ref 97–106)
CO2: 27 mmol/L (ref 18–29)
CREATININE: 1.33 mg/dL — AB (ref 0.57–1.00)
Calcium: 9.5 mg/dL (ref 8.7–10.3)
GFR calc Af Amer: 47 mL/min/{1.73_m2} — ABNORMAL LOW (ref >59)
GFR calc non Af Amer: 41 mL/min/{1.73_m2} — ABNORMAL LOW (ref >59)
GLUCOSE: 105 mg/dL — AB (ref 65–99)
Potassium: 3.1 mmol/L — ABNORMAL LOW (ref 3.5–5.2)
SODIUM: 143 mmol/L (ref 136–144)

## 2015-04-16 LAB — TSH: TSH: 0.706 u[IU]/mL (ref 0.450–4.500)

## 2015-04-18 NOTE — ED Provider Notes (Signed)
CSN: [id number]     Arrival date & time 04/11/15  1708 History   First MD Initiated Contact with Patient 04/11/15 1719     Chief Complaint  Patient presents with  . Abdominal Pain     (Consider location/radiation/quality/duration/timing/severity/associated sxs/prior Treatment) HPI   69 year old female with left flank pain. Acute onset yesterday evening. Sniffling worse this morning. Pain is the left flank and radiates down into her left groin. Constant. No appreciable exacerbating relieving factors. Sharp in nature. Associated with nausea. No urinary complaints. No fevers or chills. No unusual vaginal bleeding or discharge. Denies past history kidney stones. No intervention prior to arrival.  Past Medical History  Diagnosis Date  . HTN (hypertension)     cholesterol & thyroid  . Macular degeneration   . Reflux gastritis   . Thyroid disorder   . Osteoporosis   . Hypothyroidism   . Fatty liver   . Osteopenia   . Macular degeneration, bilateral    Past Surgical History  Procedure Laterality Date  . Total abdominal hysterectomy w/ bilateral salpingoophorectomy    . Diagnostic laparoscopy    . Abdominal hysterectomy   patial, pre-uterine cancer, 1974  . Colonoscopy N/A 04/01/2015    Procedure: COLONOSCOPY;  Surgeon: Rogene [place], MD;  [facility];  Service: Endoscopy;  Laterality: N/A;  1030   Family History  Problem Relation Age of Onset  . Cancer Mother   . Stroke Father    Social History  Substance Use Topics  . Smoking status: Former Smoker    Quit date: 12/07/2000  . Smokeless tobacco: None  . Alcohol Use: No   OB History    No data available     Review of Systems  All systems reviewed and negative, other than as noted in HPI.   Allergies  Review of patient's allergies indicates no known allergies.  Home Medications   Prior to Admission medications   Medication Sig Start Date End Date Taking? Authorizing Provider  Calcium Citrate (CITRACAL  PO) Take 2 tablets by mouth daily.   Yes Historical Provider, MD  levothyroxine (SYNTHROID, LEVOTHROID) 75 MCG tablet TAKE 1 TABLET BY MOUTH DAILY 01/06/15  Yes Nilda Simmer, NP  lisinopril (PRINIVIL,ZESTRIL) 10 MG tablet TAKE 1 TABLET(10 MG) BY MOUTH DAILY 04/03/15  Yes Kathyrn Drown, MD  Multiple Vitamins-Minerals (Eads PO) Take 1 tablet by mouth 2 (two) times daily.    Yes Historical Provider, MD  rosuvastatin (CRESTOR) 10 MG tablet Take 1 tablet (10 mg total) by mouth at bedtime. 07/01/14  Yes Kathyrn Drown, MD  Travoprost, BAK Free, (TRAVATAN) 0.004 % SOLN ophthalmic solution Place 1 drop into the left eye at bedtime.   Yes Historical Provider, MD  UNKNOWN TO PATIENT Place into the right eye every 8 (eight) weeks. Injection for macular degeneration   Yes Historical Provider, MD  ondansetron (ZOFRAN) 4 MG tablet Take 1 tablet (4 mg total) by mouth every 6 (six) hours. 04/11/15   Virgel Manifold, MD  oxyCODONE-acetaminophen (PERCOCET/ROXICET) 5-325 MG tablet Take 1-2 tablets by mouth every 4 (four) hours as needed for severe pain. 04/11/15   Virgel Manifold, MD   BP 163/78 mmHg  Pulse 91  Temp(Src) 97.4 F (36.3 C) (Oral)  Resp 14  Ht 5\' 4"  (1.626 m)  Wt 173 lb (78.472 kg)  BMI 29.68 kg/m2  SpO2 95% Physical Exam  Constitutional: She appears well-developed and well-nourished. No distress.  Sitting up in bed with her hand on  her left side. Appears uncomfortable.  HENT:  Head: Normocephalic and atraumatic.  Eyes: Conjunctivae are normal. Right eye exhibits no discharge. Left eye exhibits no discharge.  Neck: Neck supple.  Cardiovascular: Normal rate, regular rhythm and normal heart sounds.  Exam reveals no gallop and no friction rub.   No murmur heard. Pulmonary/Chest: Effort normal and breath sounds normal. No respiratory distress.  Abdominal: Soft. She exhibits no distension. There is no tenderness.  Genitourinary:  Left CVA tenderness  Musculoskeletal: She exhibits no  edema or tenderness.  Neurological: She is alert.  Skin: Skin is warm and dry.  Psychiatric: She has a normal mood and affect. Her behavior is normal. Thought content normal.  Nursing note and vitals reviewed.   ED Course  Procedures (including critical care time) Labs Review Labs Reviewed  BASIC METABOLIC PANEL - Abnormal; Notable for the following:    Potassium 3.3 (*)    Glucose, Bld 128 (*)    All other components within normal limits  CBC WITH DIFFERENTIAL/PLATELET    Imaging Review No results found.   Dg Bone Density  04/15/2015  EXAM: DUAL X-RAY ABSORPTIOMETRY (DXA) FOR BONE MINERAL DENSITY IMPRESSION: Ordering Physician:  Dr. Kathyrn Drown, Your patient Zakyrah Reuss completed a BMD test on 04/15/2015 using the [place] (software version: 14.10) manufactured by [employer]. The following summarizes the results of our evaluation. PATIENT BIOGRAPHICAL: Name: ADRIE, AUTIN Patient ID: [id number] Birth Date: 06-25-1945 Height: 64.0 in. Gender: Female Exam Date: 04/15/2015 Weight: 173.0 lbs. Indications: Post Menopausal, Follow up Osteopenia, Caucasian, Partial Hysterectomy, History of Fracture (Adult) Fractures: Ankle Treatments: Calcium, Vitamin D DENSITOMETRY RESULTS: Site         Region     Measured Date Measured Age WHO Classification Young Adult T-score BMD         %Change vs. Previous Significant Change (*) DualFemur Neck Left 04/15/2015 [age] Osteopenia -1.8 0.791 g/cm2 -4.2% Yes DualFemur Neck Left 07/11/2012 67.5 Osteopenia -1.5 0.826 g/cm2 2.0% - DualFemur Neck Left 06/03/2010 65.4 Osteopenia -1.6 0.810 g/cm2 - - Left Forearm Radius 33% 04/15/2015 70.2 Normal -1.0 0.645 g/cm2 - - ASSESSMENT: BMD as determined from Femur Neck Left is 0.791 g/cm2 with a T-Score of -1.8. This patient is considered osteopenic according to [facility]) criteria. Compared with the prior study on 07/11/2012, the BMD of the left femoral neck shows a  statistically significant decrease. (Lumbar spine was not utilized due to advanced degenerative changes.) World Pharmacologist (WHO) criteria for post-menopausal, Caucasian Women: Normal:       T-score at or above -1 SD Osteopenia:   T-score between -1 and -2.5 SD Osteoporosis: T-score at or below -2.5 SD RECOMMENDATIONS: Superior recommends that FDA-approved medial therapies be considered in postmenopausal women and men age 4 or older with a: 1. Hip or vertebral (clinical or morphometric) fracture. 2. T-Score of < -2.5 at the spine or hip. 3. Ten-year fracture probability by FRAX of 3% or greater for hip fracture or 20% or greater for major osteoporotic fracture. All treatment decisions require clinical judgment and consideration of individual patient factors, including patient preferences, co-morbidities, previous drug use, risk factors not captured in the FRAX model (e.g. falls, vitamin D deficiency, increased bone turnover, interval significant decline in bone density) and possible under-or over-estimation of fracture risk by FRAX. All patients should ensure an adequate intake of dietary calcium (1200 mg/d) and vitamin D (800 IU daily) unless contraindicated. FOLLOW-UP: People with diagnosed cases  of osteoporosis or osteopenia should be regularly tested for bone mineral density. For patients eligible for Medicare, routine testing is allowed once every 2 years. Testing frequency can be increased for patients who have rapidly progressing disease, or for those who are receiving medical therapy to restore bone mass. I have reviewed this report, and agree with the above findings. [facility] Your patient Melode Murry completed a FRAX assessment on 04/15/2015 using the [place] (analysis version: 14.10) manufactured by [employer]. The following summarizes the results of our evaluation. PATIENT BIOGRAPHICAL: Name: MESCAL, HAWKEY Patient ID: [id number] Birth  Date: [birth year] Height:    64.0 in. Gender:     Female    [age]       Weight:    173.0 lbs. Ethnicity:  White                            Exam Date: 04/15/2015 FRAX* RESULTS:  (version: 3.5) 10-year Probability of Fracture1 Major Osteoporotic Fracture2 Hip Fracture 16.7% 2.8% Population: [place] (Caucasian) Risk Factors: History of Fracture (Adult) Based on Femur (Left) Neck BMD 1 -The 10-year probability of fracture may be lower than reported if the patient has received treatment. 2 -Major Osteoporotic Fracture: Clinical Spine, Forearm, Hip or Shoulder *FRAX is a Materials engineer of the [employer] for Metabolic Bone Disease, a [place] (WHO) [facility]. ASSESSMENT: The probability of a major osteoporotic fracture is 16.7% within the next ten years. The probability of a hip fracture is 2.8% within the next ten years. Electronically Signed   By: David  [place] M.D.   On: 04/15/2015 09:49   Ct Renal Stone Study  04/11/2015  CLINICAL DATA:  Left-sided abdominal pain. EXAM: CT ABDOMEN AND PELVIS WITHOUT CONTRAST TECHNIQUE: Multidetector CT imaging of the abdomen and pelvis was performed following the standard protocol without IV contrast. COMPARISON:  None. FINDINGS: Lower chest:  No acute findings. Hepatobiliary: No mass visualized on this un-enhanced exam. Pancreas: No mass or inflammatory process identified on this un-enhanced exam. Spleen: Within normal limits in size. Adrenals/Urinary Tract: No evidence of right urolithiasis or hydronephrosis. No definite mass visualized on this un-enhanced exam. There is a 5 mm calculus within the distal left ureter, approximately 2 cm upstream from the vesicoureteral junction, causing mild upstream left hydroureter and hydronephrosis. Stomach/Bowel: No evidence of obstruction, inflammatory process, or abnormal fluid collections. There is a 3.2 x 1.6 x 1.5 cm fluid to fat density circumscribed mass adjacent to the  transverse colon with nonaggressive features. Vascular/Lymphatic: No pathologically enlarged lymph nodes. No evidence of abdominal aortic aneurysm. There is mild atherosclerotic disease of the aorta. Reproductive: No mass or other significant abnormality. Post hysterectomy. Other: Left colonic diverticulosis without evidence of diverticulitis. Musculoskeletal: No suspicious bone lesions identified. Multilevel osteoarthritic changes of the lumbosacral spine as seen. IMPRESSION: 5 mm distal left ureteral calculus causing mild left obstructive uropathy. Left colonic diverticulosis. Atherosclerotic disease of the aorta. 3.2 cm low-density mesenteric mass adjacent to the transverse colon with nonaggressive features. In the absence of history of malignancy this likely represents a benign finding. Follow-up in 3 months with CT of the abdomen is recommended. Electronically Signed   By: Fidela Salisbury M.D.   On: 04/11/2015 18:58   I have personally reviewed and evaluated these images and lab results as part of my medical decision-making.   EKG Interpretation None  MDM   Final diagnoses:  Left ureteral stone    69 year old female with left flank pain. Imaging significant for a distal left ureteral stone. She is afebrile. Symptoms improved. At this point I feel she is stable for discharge. Plan continued symptomatic treatment and expected management. Emergent return precautions were discussed. Urology follow-up as needed otherwise.    Virgel Manifold, MD 04/18/15 [phone]

## 2015-04-21 ENCOUNTER — Encounter (HOSPITAL_COMMUNITY): Payer: Self-pay | Admitting: Emergency Medicine

## 2015-04-21 ENCOUNTER — Emergency Department (HOSPITAL_COMMUNITY): Payer: Medicare Other

## 2015-04-21 ENCOUNTER — Emergency Department (HOSPITAL_COMMUNITY)
Admission: EM | Admit: 2015-04-21 | Discharge: 2015-04-21 | Disposition: A | Discharge status: Home / Self Care | Payer: Medicare Other | Attending: Emergency Medicine | Admitting: Emergency Medicine

## 2015-04-21 DIAGNOSIS — E039 Hypothyroidism, unspecified: Secondary | ICD-10-CM | POA: Diagnosis not present

## 2015-04-21 DIAGNOSIS — S80811A Abrasion, right lower leg, initial encounter: Secondary | ICD-10-CM | POA: Insufficient documentation

## 2015-04-21 DIAGNOSIS — R109 Unspecified abdominal pain: Secondary | ICD-10-CM | POA: Diagnosis present

## 2015-04-21 DIAGNOSIS — Z8739 Personal history of other diseases of the musculoskeletal system and connective tissue: Secondary | ICD-10-CM | POA: Insufficient documentation

## 2015-04-21 DIAGNOSIS — Z87442 Personal history of urinary calculi: Secondary | ICD-10-CM | POA: Diagnosis not present

## 2015-04-21 DIAGNOSIS — Z8669 Personal history of other diseases of the nervous system and sense organs: Secondary | ICD-10-CM | POA: Insufficient documentation

## 2015-04-21 DIAGNOSIS — S79921A Unspecified injury of right thigh, initial encounter: Secondary | ICD-10-CM | POA: Diagnosis not present

## 2015-04-21 DIAGNOSIS — N23 Unspecified renal colic: Secondary | ICD-10-CM | POA: Diagnosis not present

## 2015-04-21 DIAGNOSIS — S90121A Contusion of right lesser toe(s) without damage to nail, initial encounter: Secondary | ICD-10-CM

## 2015-04-21 DIAGNOSIS — W208XXA Other cause of strike by thrown, projected or falling object, initial encounter: Secondary | ICD-10-CM | POA: Diagnosis not present

## 2015-04-21 DIAGNOSIS — N201 Calculus of ureter: Secondary | ICD-10-CM | POA: Insufficient documentation

## 2015-04-21 DIAGNOSIS — F0390 Unspecified dementia without behavioral disturbance: Secondary | ICD-10-CM | POA: Diagnosis not present

## 2015-04-21 DIAGNOSIS — S7011XA Contusion of right thigh, initial encounter: Secondary | ICD-10-CM | POA: Diagnosis not present

## 2015-04-21 DIAGNOSIS — S90111A Contusion of right great toe without damage to nail, initial encounter: Secondary | ICD-10-CM | POA: Diagnosis not present

## 2015-04-21 DIAGNOSIS — N132 Hydronephrosis with renal and ureteral calculous obstruction: Secondary | ICD-10-CM | POA: Diagnosis not present

## 2015-04-21 DIAGNOSIS — Z87891 Personal history of nicotine dependence: Secondary | ICD-10-CM | POA: Insufficient documentation

## 2015-04-21 DIAGNOSIS — S92424A Nondisplaced fracture of distal phalanx of right great toe, initial encounter for closed fracture: Secondary | ICD-10-CM | POA: Diagnosis not present

## 2015-04-21 DIAGNOSIS — Z8719 Personal history of other diseases of the digestive system: Secondary | ICD-10-CM | POA: Diagnosis not present

## 2015-04-21 DIAGNOSIS — Y998 Other external cause status: Secondary | ICD-10-CM | POA: Diagnosis not present

## 2015-04-21 DIAGNOSIS — Y9289 Other specified places as the place of occurrence of the external cause: Secondary | ICD-10-CM | POA: Insufficient documentation

## 2015-04-21 DIAGNOSIS — Z8659 Personal history of other mental and behavioral disorders: Secondary | ICD-10-CM | POA: Diagnosis not present

## 2015-04-21 DIAGNOSIS — I10 Essential (primary) hypertension: Secondary | ICD-10-CM | POA: Diagnosis not present

## 2015-04-21 DIAGNOSIS — M7989 Other specified soft tissue disorders: Secondary | ICD-10-CM | POA: Diagnosis not present

## 2015-04-21 DIAGNOSIS — S90411A Abrasion, right great toe, initial encounter: Secondary | ICD-10-CM | POA: Diagnosis not present

## 2015-04-21 DIAGNOSIS — Y9389 Activity, other specified: Secondary | ICD-10-CM | POA: Diagnosis not present

## 2015-04-21 HISTORY — DX: Other amnesia: R41.3

## 2015-04-21 HISTORY — DX: Calculus of kidney: N20.0

## 2015-04-21 LAB — URINALYSIS, ROUTINE W REFLEX MICROSCOPIC
BILIRUBIN URINE: NEGATIVE
Glucose, UA: NEGATIVE mg/dL
KETONES UR: NEGATIVE mg/dL
NITRITE: NEGATIVE
SPECIFIC GRAVITY, URINE: 1.02 (ref 1.005–1.030)
pH: 7 (ref 5.0–8.0)

## 2015-04-21 LAB — URINE MICROSCOPIC-ADD ON

## 2015-04-21 MED ORDER — IBUPROFEN 400 MG PO TABS: 400.0000 mg | ORAL_TABLET | Freq: Once | ORAL | Status: DC

## 2015-04-21 MED ORDER — BACITRACIN ZINC 500 UNIT/GM EX OINT
TOPICAL_OINTMENT | CUTANEOUS | Status: AC
  Filled 2015-04-21: qty 1.8

## 2015-04-21 MED ORDER — OXYCODONE-ACETAMINOPHEN 5-325 MG PO TABS
2.0000 | ORAL_TABLET | Freq: Once | ORAL | Status: AC
  Administered 2015-04-21: 2 via ORAL
  Filled 2015-04-21: qty 2

## 2015-04-21 MED ORDER — OXYCODONE-ACETAMINOPHEN 5-325 MG PO TABS: ORAL_TABLET | ORAL | Status: DC

## 2015-04-21 NOTE — Discharge Instructions (Signed)
°Emergency Department Resource Guide °1) Find a Doctor and Pay Out of Pocket °Although you won't have to find out who is covered by your insurance plan, it is a good idea to ask around and get recommendations. You will then need to call the office and see if the doctor you have chosen will accept you as a new patient and what types of options they offer for patients who are self-pay. Some doctors offer discounts or will set up payment plans for their patients who do not have insurance, but you will need to ask so you aren't surprised when you get to your appointment. ° °2) Contact Your Local Health Department °Not all health departments have doctors that can see patients for sick visits, but many do, so it is worth a call to see if yours does. If you don't know where your local health department is, you can check in your phone book. The CDC also has a tool to help you locate your state's health department, and many state websites also have listings of all of their local health departments. ° °3) Find a Walk-in Clinic °If your illness is not likely to be very severe or complicated, you may want to try a walk in clinic. These are popping up all over the country in pharmacies, drugstores, and shopping centers. They're usually staffed by nurse practitioners or physician assistants that have been trained to treat common illnesses and complaints. They're usually fairly quick and inexpensive. However, if you have serious medical issues or chronic medical problems, these are probably not your best option. ° °No Primary Care Doctor: °- Call Health Connect at  832-8000 - they can help you locate a primary care doctor that  accepts your insurance, provides certain services, etc. °- Physician Referral Service- 1-800-533-3463 ° °Chronic Pain Problems: °Organization         Address  Phone   Notes  °[facility]  ([phone]) [phone] Patients need to be referred by their primary care doctor.  ° °Medication  Assistance: °Organization         Address  Phone   Notes  °[place] [facility] [address] °[place], [place] [address] (336) 641-8030 --Must be a resident of [place] °-- Must have NO insurance coverage whatsoever (no Medicaid/ Medicare, etc.) °-- The pt. MUST have a primary care doctor that directs their care regularly and follows them in the community °  °[facility]  (866) 331-1348   °[employer]  (888) 892-1162   ° °Agencies that provide inexpensive medical care: °Organization         Address  Phone   Notes  °[facility]  (336) 832-8035   ° Internal Medicine    (336) 832-7272   °[facility] [address] °[place], [place] [address] (336) 832-4777   °[facility] [address], °[place] (336) 271-4999   °[facility]    (336) 373-0678   °[facility]    (336) 272-1050   °[facility] ° [address], [place] Phone:  (336) 832-4444, Fax:  (336) 832-4440 Hours of Operation:  9 am - 6 pm, M-F.  Also accepts Medicaid/Medicare and self-pay.  °[facility] ° [address], [place] Phone: (336) [phone], Fax: (336) [phone]. Hours of Operation:  8:30 am - 5:30 pm, M-F.  Also accepts Medicaid and self-pay.  °[facility] [place] [address], [place] Phone: (336) 878-6027   °[facility] [address], [place], [place] (336)723-1848, Ext. 123 Mondays & Thursdays: 7-9 AM.  First 15 patients are seen on a first come, first serve basis. °  ° °Medicaid-accepting [place] Providers: ° °Organization         Address  Phone   Notes  °[facility] [address], [place] (336) 641-2100 Also accepts self-pay patients.  °[facility] [address], [place] ° (336) [phone]   °[facility] [address], [place]  (336) 288-8857   °[facility] [address] [place] [address], [place] (336) 299-7000   °Veita Bland [address], [place]  ° (336) [phone] Only accepts [place] Access Medicaid patients after they have their name applied to their card.  ° °Self-Pay (no insurance) in [place]: ° °Organization         Address  Phone   Notes  °Sickle Cell Patients, [facility] [address], [place] (336) 832-1970   °[facility] [address], [place] (336) 832-4400   °[facility] ° [address], [place] (336) 992-4800   °[facility]/Dr. Osei-Bonsu ° [address] [place] [address], [place] or [address], [place] (336) [phone] Phone number for both [place] and [place] locations is the same.  °[facility] [address], [place] (336) 299-0000   °[facility] [address] [place] [address], [place] or [address] (336) 852-7530 °(336) 878-2260   °[facility] [address], [place] (336) 350-1642, phone; (336) 294-5005, fax Sees patients 1st and 3rd Saturday of every month.  Must not qualify for public or private insurance (i.e. Medicaid, Medicare, [place] Health Choice, Veterans' Benefits) • Household income should be no more than 200% of the poverty level •The clinic cannot treat you if you are pregnant or think you are pregnant • Sexually transmitted diseases are not treated at the clinic.  ° ° °Dental Care: °Organization         Address  Phone  Notes  °[place] [facility] [address], [place] (336) 641-6152 Accepts children up to age 21 who are enrolled in Medicaid or [place] Health Choice; pregnant women with a Medicaid card; and children who have applied for Medicaid or [place] Health Choice, but were declined, whose parents can pay a reduced fee at time of service.  °[place]  [facility] [place]  [address], [place] (336) 641-7733 Accepts children up to age 21 who are enrolled in Medicaid or [place] Health Choice; pregnant women with a Medicaid card; and children who have applied for Medicaid or [place] Health Choice, but were declined, whose parents can pay a reduced fee at time of service.  °[facility] ° [address], [place] (336) 641-4533 Patients are seen by appointment only. Walk-ins are not accepted. [facility] will see patients 18 years of age and older. °Monday - Tuesday (8am-5pm) °Most Wednesdays (8:30-5pm) °$30 per visit, cash only  °[facility] ° [address], [place] (336) 641-4533 Patients are seen by appointment only. Walk-ins are not accepted. [facility] will see patients 18 years of age and older. °One   Wednesday Evening (Monthly: Volunteer Based).  $30 per visit, cash only  °[facility]  (919) 537-3737 for adults; Children under age 4, call Graduate Pediatric Dentistry at (919) [phone]. Children aged 4-14, please call (919) [phone] to request a pediatric application. ° Dental services are provided in all areas of dental care including fillings, crowns and bridges, complete and partial dentures, implants, gum treatment, root canals, and extractions. Preventive care is also provided. Treatment is provided to both adults and children. °Patients are selected via a lottery and there is often a waiting list. °  °[facility] [address], °[place] ° (336) 763-8833 [email or web address].[email or web address] °  °[facility] [address], [place], [place] (336)723-1848, Ext. 123 Second and Fourth Thursday of each month, opens at 6:30 AM; Clinic ends at 9 AM.  Patients are seen on a first-come first-served basis, and a limited number are seen during each clinic.  ° °[facility] ° [address], [place], [place] (336) 723-7904    Eligibility Requirements °You must have lived in [place], [place], or [place] counties for at least the last three months. °  You cannot be eligible for state or federal sponsored healthcare insurance, including Veterans Administration, Medicaid, or Medicare. °  You generally cannot be eligible for healthcare insurance through your employer.  °  How to apply: °Eligibility screenings are held every Tuesday and Wednesday afternoon from 1:00 pm until 4:00 pm. You do not need an appointment for the interview!  °[facility] [address], [place], [place] 336-631-2330   °[facility]  336-342-8273   °[place]  336-703-3100   °[facility]  336-570-6415   ° °Behavioral Health Resources in the Community: °Intensive Outpatient Programs °Organization         Address  Phone  Notes  °[facility] [address], [place], [place] 336-878-6098   °[facility] [address], [place], [place] 336-832-9800   °[facility] [address], [place], [place] ° 336-882-2125   °[facility] [address],  °[place], [place] 1-800-853-5163 or 336-641-4981   °Substance Abuse Resources °Organization         Address  Phone  Notes  °[facility]  336-882-2125   °[facility]  336-784-9470   °[facility]  336-285-9073   °[facility]  336-845-3988   °Residential & Outpatient Substance Abuse Program  1-800-659-3381   °Psychological Services °Organization         Address  Phone  Notes  °[facility]  336- 832-9600   °[facility]  336- 378-7881   °[facility] [address], [place] 1-800-853-5163 or 336-641-4981   ° °Mobile Crisis Teams °Organization         Address  Phone  Notes  °[facility], Mobile Crisis Care Unit  1-877-626-1772   °Assertive °[facility] ° [address]  [place], [place] 336-834-9664   °Sharon DeEsch [address] °[place] 336-554-5454   ° °Self-Help/Support Groups °Organization         Address  Phone             Notes  °[facility]  - variety of support groups  336- 373-1402 Call for more information  °Narcotics Anonymous (NA), [facility] [address], °[place]   2 meetings at this location  ° °  Residential Treatment Programs Organization         Address  Phone  Notes  [facility] [address],    [place]  1-3047791258   [facility]  [address], [place] [id number], [place], [place]   [place], [place] [phone] Admissions: 8am-3pm M-F  [facility] [address],    [place], [place] X4321937   [facility] [address] [place], [place], [place]   [facility] [address]  [place], [place]   [facility] Intensive Outpatient [address], [place], [place]   [facility] [address]  [place], [place] 1-(646)848-3486 or 513-222-6918   [facility] [address] [place], [place] Accepts Medicaid  [facility] [address]  [place] 1-409-307-2050 Substance Abuse/Addiction Treatment   [facility]         Address  Phone  Notes  [facility]  4386569408   Domenic Schwab, PhD [address] [place], [place]   252-214-4063 or 8127964915   [place], [place] 412-781-3834   [facility] [address] [place], [place] (719) 550-3252 Insurance/Medicaid/sponsorship through [facility] [address] [place]                                    [place], [place] [phone] [place] [address] [place], [place] [phone]    Dr. Adele Schilder  ([phone]   [facility] Dept. 1) [address], [place] 2) [employer] 3)  [place] [address], [place] [phone] [phone]  [phone]   [place] [phone] or [phone] (After Hours)     Take the prescription as directed. Buddy tape your right toes, as instructed in the Emergency Department today. Wash the abraded areas with soap and water at least twice a day, and cover with a clean/dry dressing.  Change the dressing whenever it becomes wet or soiled after washing the area with soap and water.  Call the Urologist and your regular medical doctor today to schedule a follow up appointment this week.  Return to the Emergency Department immediately if worsening.

## 2015-04-21 NOTE — ED Provider Notes (Signed)
CSN: [id number]     Arrival date & time 04/21/15  1314 History   First MD Initiated Contact with Patient 04/21/15 1325     Chief Complaint  Patient presents with  . Flank Pain      Patient is a 69 y.o. female presenting with flank pain. The history is provided by the patient and the spouse. The history is limited by the condition of the patient (Hx dementia).  Flank Pain   Pt was seen at 1355. Per pt and her husband: Pt c/o sudden onset and worsening of multiple intermittent episodes of left sided flank "pain" for the past 10 days. Pt was evaluated in the ED 10 days ago for this, dx kidney stone, rx percocet. Pt f/u with Uro MD last week and has f/u again next month. LD percocet 1 tab at 0900 this morning. Pt also c/o "some wood fell on my leg" this morning. Pt's husband states pt has been c/o RLE pain since the incident. Pt herself states her RLE is "ok" and is requesting pain meds "for my back." Pt has been ambulatory since the injury. Denies any other injuries. Denies N/V/D, no abd pain, no CP/SOB, no fevers, no dysuria, no focal motor weakness, no tingling/numbness in extremities.     Uro Dr. Diona Fanti Td UTD Past Medical History  Diagnosis Date  . HTN (hypertension)     cholesterol & thyroid  . Macular degeneration   . Reflux gastritis   . Thyroid disorder   . Osteoporosis   . Hypothyroidism   . Fatty liver   . Osteopenia   . Macular degeneration, bilateral   . Memory disturbance   . Kidney stone    Past Surgical History  Procedure Laterality Date  . Total abdominal hysterectomy w/ bilateral salpingoophorectomy    . Diagnostic laparoscopy    . Abdominal hysterectomy   patial, pre-uterine cancer, 1974  . Colonoscopy N/A 04/01/2015    Procedure: COLONOSCOPY;  Surgeon: Rogene Houston, MD;  Location: [facility];  Service: Endoscopy;  Laterality: N/A;  1030   Family History  Problem Relation Age of Onset  . Cancer Mother   . Stroke Father    Social History   Substance Use Topics  . Smoking status: Former Smoker    Quit date: 12/07/2000  . Smokeless tobacco: None  . Alcohol Use: No    Review of Systems  Unable to perform ROS: Dementia  Genitourinary: Positive for flank pain.      Allergies  Review of patient's allergies indicates no known allergies.  Home Medications   Prior to Admission medications   Medication Sig Start Date End Date Taking? Authorizing Provider  Calcium Citrate (CITRACAL PO) Take 2 tablets by mouth daily.    Historical Provider, MD  levothyroxine (SYNTHROID, LEVOTHROID) 75 MCG tablet TAKE 1 TABLET BY MOUTH DAILY 01/06/15   Nilda Simmer, NP  lisinopril (PRINIVIL,ZESTRIL) 10 MG tablet TAKE 1 TABLET(10 MG) BY MOUTH DAILY 04/03/15   Kathyrn Drown, MD  Multiple Vitamins-Minerals ([place] PO) Take 1 tablet by mouth 2 (two) times daily.     Historical Provider, MD  ondansetron (ZOFRAN) 4 MG tablet Take 1 tablet (4 mg total) by mouth every 6 (six) hours. 04/11/15   Virgel Manifold, MD  oxyCODONE-acetaminophen (PERCOCET/ROXICET) 5-325 MG tablet Take 1-2 tablets by mouth every 4 (four) hours as needed for severe pain. 04/11/15   Virgel Manifold, MD  rosuvastatin (CRESTOR) 10 MG tablet Take 1 tablet (10 mg total) by  mouth at bedtime. 07/01/14   Kathyrn Drown, MD  Travoprost, BAK Free, (TRAVATAN) 0.004 % SOLN ophthalmic solution Place 1 drop into the left eye at bedtime.    Historical Provider, MD  UNKNOWN TO PATIENT Place into the right eye every 8 (eight) weeks. Injection for macular degeneration    Historical Provider, MD   BP 161/79 mmHg  Pulse 71  Temp(Src) 97.4 F (36.3 C) (Oral)  Resp 16  Ht 5\' 4"  (1.626 m)  Wt 170 lb (77.111 kg)  BMI 29.17 kg/m2  SpO2 97% Physical Exam 1400: Physical examination:  Nursing notes reviewed; Vital signs and O2 SAT reviewed;  Constitutional: Well developed, Well nourished, Well hydrated, Uncomfortable appearing.; Head:  Normocephalic, atraumatic; Eyes: EOMI, PERRL, No  scleral icterus; ENMT: Mouth and pharynx normal, Mucous membranes moist; Neck: Supple, Full range of motion, No lymphadenopathy; Cardiovascular: Regular rate and rhythm, No gallop; Respiratory: Breath sounds clear & equal bilaterally, No wheezes.  Speaking full sentences with ease, Normal respiratory effort/excursion; Chest: Nontender, Movement normal; Abdomen: Soft, Nontender, Nondistended, Normal bowel sounds; Genitourinary: No CVA tenderness; Spine:  No midline CS, TS, LS tenderness.;; Extremities: Pulses normal, No deformity. No edema, No calf edema or asymmetry. +ecchymosis and superficial abrasions right thigh, right great toe. NT right hip/knee/ankle/foot.; Neuro: Awake, alert, mildly confused re: events per hx dementia.  Major CN grossly intact. No facial droop. Speech clear. No gross focal motor deficits in extremities.; Skin: Color normal, Warm, Dry.   ED Course  Procedures (including critical care time) Labs Review   Imaging Review  I have personally reviewed and evaluated these images and lab results as part of my medical decision-making.   EKG Interpretation None      MDM  MDM Reviewed: previous chart, nursing note and vitals Reviewed previous: CT scan and labs Interpretation: CT scan and labs      Results for orders placed or performed during the hospital encounter of 04/21/15  Urinalysis, Routine w reflex microscopic  Result Value Ref Range   Color, Urine YELLOW YELLOW   APPearance CLEAR CLEAR   Specific Gravity, Urine 1.020 1.005 - 1.030   pH 7.0 5.0 - 8.0   Glucose, UA NEGATIVE NEGATIVE mg/dL   Hgb urine dipstick LARGE (A) NEGATIVE   Bilirubin Urine NEGATIVE NEGATIVE   Ketones, ur NEGATIVE NEGATIVE mg/dL   Protein, ur TRACE (A) NEGATIVE mg/dL   Nitrite NEGATIVE NEGATIVE   Leukocytes, UA TRACE (A) NEGATIVE  Urine microscopic-add on  Result Value Ref Range   Squamous Epithelial / LPF 6-30 (A) NONE SEEN   WBC, UA 0-5 0 - 5 WBC/hpf   RBC / HPF 6-30 0 - 5  RBC/hpf   Bacteria, UA FEW (A) NONE SEEN   Dg Knee Complete 4 Views Right 04/21/2015  CLINICAL DATA:  Status post fall onto the right knee this morning. Pain and swelling. Initial encounter. EXAM: RIGHT KNEE - COMPLETE 4+ VIEW COMPARISON:  None. FINDINGS: No acute bony or joint abnormality is identified. There is no joint effusion. Chondrocalcinosis is noted. Joint spaces appear preserved. IMPRESSION: No acute abnormality. Chondrocalcinosis. Electronically Signed   By: Inge Rise M.D.   On: 04/21/2015 15:13   Dg Foot Complete Right 04/21/2015  CLINICAL DATA:  Blunt trauma with wood falling on foot, initial encounter EXAM: RIGHT FOOT COMPLETE - 3+ VIEW COMPARISON:  None. FINDINGS: There is a nondisplaced fracture through the midportion of the first distal phalanx. Some associated soft tissue swelling is noted. No other focal abnormality is seen.  IMPRESSION: First distal phalangeal fracture without significant displacement. Electronically Signed   By: Inez Catalina M.D.   On: 04/21/2015 15:19   Ct Renal Stone Study 04/21/2015  CLINICAL DATA:  LEFT side pain beginning this morning, similar pain to which she had a week ago EXAM: CT ABDOMEN AND PELVIS WITHOUT CONTRAST TECHNIQUE: Multidetector CT imaging of the abdomen and pelvis was performed following the standard protocol without IV contrast. Sagittal and coronal MPR images reconstructed from axial data set. Oral contrast not administered for this indication. COMPARISON:  04/11/2015 FINDINGS: Dependent atelectasis RIGHT lower lobe. Mildly increased LEFT hydronephrosis and LEFT hydroureter versus previous exam. 3 mm distal LEFT ureteral calculus seen on previous exam now at LEFT ureterovesical junction. Bladder and RIGHT ureter unremarkable. Minimal pericardial effusion. Tiny fat containing lesion lateral aspect upper LEFT kidney 8 mm diameter coronal image 64 consistent with angiomyolipoma. Liver, gallbladder, spleen, pancreas, kidneys and adrenal glands  otherwise normal appearance. Scattered colonic diverticula. Stomach and bowel loops otherwise normal appearance for technique. Normal appendix. Uterus surgically absent with unremarkable ovaries. Scattered normal size mesenteric lymph nodes. Minimal atherosclerotic calcification aorta. No mass, adenopathy, free air, free fluid or inflammatory process. Osseous demineralization with scattered degenerative disc disease changes thoracolumbar spine. IMPRESSION: Mildly increased LEFT hydronephrosis and LEFT hydroureter secondary to a 3 mm diameter calculus now at the LEFT ureterovesical junction, previously in distal ureter. Tiny LEFT adrenal angiomyolipoma 8 mm diameter. Minimal pericardial effusion. Electronically Signed   By: Lavonia Dana M.D.   On: 04/21/2015 15:00   Dg Femur 1v Right 04/21/2015  CLINICAL DATA:  Injury EXAM: RIGHT FEMUR 1 VIEW COMPARISON:  None. FINDINGS: Single view of proximal and mid femur submitted. No acute fracture or subluxation. Please note the distal right femur is not included in the film. IMPRESSION: Negative. Electronically Signed   By: Lahoma Crocker M.D.   On: 04/21/2015 15:16    1530:  Buddy tape toes. Ureteral calculus moving distally. Pt feels improved after pain meds and wants to go home now. Has tol PO well while in the ED without N/V. VSS. Husband now reveals he is giving pt only one percocet every 8 hours for pain; reiterated rx instructions. Pt and wife verb understanding. Continue to tx symptomatically at this time, f/u Uro MD. Dx and testing d/w pt and family.  Questions answered.  Verb understanding, agreeable to d/c home with outpt f/u.   Francine Graven, DO 04/24/15 [id number]

## 2015-04-21 NOTE — ED Notes (Signed)
Pt states that her left side started hurting again this morning suddenly just as it was a week ago.  Also states that some plywood fell on her this morning but denies any complaints related to this but her husband insists that her right leg is injured.

## 2015-04-23 LAB — URINE CULTURE

## 2015-04-24 DIAGNOSIS — H401130 Primary open-angle glaucoma, bilateral, stage unspecified: Secondary | ICD-10-CM | POA: Diagnosis not present

## 2015-04-28 MED ORDER — POTASSIUM CHLORIDE ER 10 MEQ PO TBCR: 10.0000 meq | EXTENDED_RELEASE_TABLET | Freq: Every day | ORAL | Status: DC

## 2015-04-28 NOTE — Addendum Note (Signed)
Addended by: Dairl Ponder on: 04/28/2015 11:18 AM   Modules accepted: Orders

## 2015-05-12 ENCOUNTER — Ambulatory Visit (INDEPENDENT_AMBULATORY_CARE_PROVIDER_SITE_OTHER): Payer: Medicare Other | Admitting: Urology

## 2015-05-12 DIAGNOSIS — N201 Calculus of ureter: Secondary | ICD-10-CM | POA: Diagnosis not present

## 2015-05-12 DIAGNOSIS — D3002 Benign neoplasm of left kidney: Secondary | ICD-10-CM | POA: Diagnosis not present

## 2015-05-12 DIAGNOSIS — N1330 Unspecified hydronephrosis: Secondary | ICD-10-CM | POA: Diagnosis not present

## 2015-05-28 DIAGNOSIS — Z23 Encounter for immunization: Secondary | ICD-10-CM | POA: Diagnosis not present

## 2015-06-09 DIAGNOSIS — H353222 Exudative age-related macular degeneration, left eye, with inactive choroidal neovascularization: Secondary | ICD-10-CM | POA: Diagnosis not present

## 2015-06-09 DIAGNOSIS — H353211 Exudative age-related macular degeneration, right eye, with active choroidal neovascularization: Secondary | ICD-10-CM | POA: Diagnosis not present

## 2015-06-30 ENCOUNTER — Other Ambulatory Visit: Payer: Self-pay | Admitting: Nurse Practitioner

## 2015-07-04 ENCOUNTER — Other Ambulatory Visit: Payer: Self-pay | Admitting: Family Medicine

## 2015-07-13 ENCOUNTER — Encounter (HOSPITAL_COMMUNITY): Payer: Self-pay | Admitting: Emergency Medicine

## 2015-07-13 ENCOUNTER — Emergency Department (HOSPITAL_COMMUNITY)
Admission: EM | Admit: 2015-07-13 | Discharge: 2015-07-14 | Disposition: A | Discharge status: Home / Self Care | Payer: Medicare Other | Attending: Emergency Medicine | Admitting: Emergency Medicine

## 2015-07-13 ENCOUNTER — Emergency Department (HOSPITAL_COMMUNITY): Payer: Medicare Other

## 2015-07-13 DIAGNOSIS — Z90710 Acquired absence of both cervix and uterus: Secondary | ICD-10-CM | POA: Insufficient documentation

## 2015-07-13 DIAGNOSIS — Z79899 Other long term (current) drug therapy: Secondary | ICD-10-CM | POA: Insufficient documentation

## 2015-07-13 DIAGNOSIS — N134 Hydroureter: Secondary | ICD-10-CM | POA: Diagnosis not present

## 2015-07-13 DIAGNOSIS — Z87891 Personal history of nicotine dependence: Secondary | ICD-10-CM | POA: Insufficient documentation

## 2015-07-13 DIAGNOSIS — I10 Essential (primary) hypertension: Secondary | ICD-10-CM | POA: Diagnosis not present

## 2015-07-13 DIAGNOSIS — N23 Unspecified renal colic: Secondary | ICD-10-CM | POA: Diagnosis not present

## 2015-07-13 DIAGNOSIS — M810 Age-related osteoporosis without current pathological fracture: Secondary | ICD-10-CM | POA: Diagnosis not present

## 2015-07-13 DIAGNOSIS — E039 Hypothyroidism, unspecified: Secondary | ICD-10-CM | POA: Diagnosis not present

## 2015-07-13 DIAGNOSIS — N1330 Unspecified hydronephrosis: Secondary | ICD-10-CM | POA: Diagnosis not present

## 2015-07-13 DIAGNOSIS — R109 Unspecified abdominal pain: Secondary | ICD-10-CM | POA: Diagnosis present

## 2015-07-13 LAB — CBC WITH DIFFERENTIAL/PLATELET
Basophils Absolute: 0 10*3/uL (ref 0.0–0.1)
Basophils Relative: 0 %
EOS ABS: 0 10*3/uL (ref 0.0–0.7)
EOS PCT: 0 %
HCT: 37 % (ref 36.0–46.0)
HEMOGLOBIN: 11.5 g/dL — AB (ref 12.0–15.0)
LYMPHS ABS: 0.9 10*3/uL (ref 0.7–4.0)
LYMPHS PCT: 8 %
MCH: 26.6 pg (ref 26.0–34.0)
MCHC: 31.1 g/dL (ref 30.0–36.0)
MCV: 85.6 fL (ref 78.0–100.0)
MONOS PCT: 4 %
Monocytes Absolute: 0.4 10*3/uL (ref 0.1–1.0)
Neutro Abs: 9.9 10*3/uL — ABNORMAL HIGH (ref 1.7–7.7)
Neutrophils Relative %: 88 %
PLATELETS: 274 10*3/uL (ref 150–400)
RBC: 4.32 MIL/uL (ref 3.87–5.11)
RDW: 15.7 % — ABNORMAL HIGH (ref 11.5–15.5)
WBC: 11.3 10*3/uL — ABNORMAL HIGH (ref 4.0–10.5)

## 2015-07-13 LAB — URINALYSIS, ROUTINE W REFLEX MICROSCOPIC
Glucose, UA: NEGATIVE mg/dL
KETONES UR: 40 mg/dL — AB
NITRITE: NEGATIVE
PROTEIN: 100 mg/dL — AB
Specific Gravity, Urine: 1.025 (ref 1.005–1.030)
pH: 6.5 (ref 5.0–8.0)

## 2015-07-13 LAB — URINE MICROSCOPIC-ADD ON

## 2015-07-13 MED ORDER — ONDANSETRON HCL 4 MG/2ML IJ SOLN
4.0000 mg | Freq: Once | INTRAMUSCULAR | Status: AC
  Administered 2015-07-13: 4 mg via INTRAVENOUS
  Filled 2015-07-13: qty 2

## 2015-07-13 MED ORDER — HYDROMORPHONE HCL 1 MG/ML IJ SOLN
1.0000 mg | Freq: Once | INTRAMUSCULAR | Status: AC
  Administered 2015-07-13: 1 mg via INTRAVENOUS
  Filled 2015-07-13: qty 1

## 2015-07-13 NOTE — ED Notes (Signed)
Right flank pain with nausea and vomiting.

## 2015-07-13 NOTE — ED Provider Notes (Signed)
CSN: [id number]     Arrival date & time 07/13/15  2030 History  By signing my name below, I, Susan Mcneil, attest that this documentation has been prepared under the direction and in the presence of No att. providers found.   Electronically Signed: Nicole Mcneil, ED Scribe. 07/13/2015. 11:52 PM    Chief Complaint  Patient presents with  . Flank Pain    The history is provided by the patient. No language interpreter was used.   HPI Comments: Susan Mcneil is a 70 y.o. female who presents to the Emergency Department complaining of gradual onset, constant, severe, right sided abdominal pain, onset earlier tonight. Pt reports associated emesis. She says that her symptoms are similar to when she has had kidney stones in the past. No other worsening or alleviating factors noted. Pt denies dysuria, hematuria, or any other pertinent symptoms.   Past Medical History  Diagnosis Date  . HTN (hypertension)     cholesterol & thyroid  . Macular degeneration   . Reflux gastritis   . Thyroid disorder   . Osteoporosis   . Hypothyroidism   . Fatty liver   . Osteopenia   . Macular degeneration, bilateral   . Memory disturbance   . Kidney stone    Past Surgical History  Procedure Laterality Date  . Total abdominal hysterectomy w/ bilateral salpingoophorectomy    . Diagnostic laparoscopy    . Abdominal hysterectomy   patial, pre-uterine cancer, 1974  . Colonoscopy N/A 04/01/2015    Procedure: COLONOSCOPY;  Surgeon: Rogene Houston, MD;  Location: [facility];  Service: Endoscopy;  Laterality: N/A;  1030   Family History  Problem Relation Age of Onset  . Cancer Mother   . Stroke Father    Social History  Substance Use Topics  . Smoking status: Former Smoker    Quit date: 12/07/2000  . Smokeless tobacco: None  . Alcohol Use: No   OB History    No data available     Review of Systems  Gastrointestinal: Positive for vomiting and abdominal pain.  Genitourinary: Negative for  dysuria and hematuria.  All other systems reviewed and are negative.    Allergies  Review of patient's allergies indicates no known allergies.  Home Medications   Prior to Admission medications   Medication Sig Start Date End Date Taking? Authorizing Provider  Calcium Citrate (CITRACAL PO) Take 2 tablets by mouth daily.   Yes Historical Provider, MD  CRESTOR 10 MG tablet TAKE 1 TABLET BY MOUTH EVERY NIGHT AT BEDTIME 07/06/15  Yes Kathyrn Drown, MD  levothyroxine (SYNTHROID, LEVOTHROID) 75 MCG tablet TAKE 1 TABLET BY MOUTH DAILY 06/30/15  Yes Kathyrn Drown, MD  lisinopril (PRINIVIL,ZESTRIL) 10 MG tablet TAKE 1 TABLET(10 MG) BY MOUTH DAILY 04/03/15  Yes Kathyrn Drown, MD  Multiple Vitamins-Minerals ([place] PO) Take 1 tablet by mouth 2 (two) times daily.    Yes Historical Provider, MD  potassium chloride (K-DUR) 10 MEQ tablet Take 1 tablet (10 mEq total) by mouth daily. 04/28/15  Yes Kathyrn Drown, MD  Travoprost, BAK Free, (TRAVATAN) 0.004 % SOLN ophthalmic solution Place 1 drop into the left eye at bedtime.   Yes Historical Provider, MD  UNKNOWN TO PATIENT Place into the right eye every 8 (eight) weeks. Injection for macular degeneration   Yes Historical Provider, MD  ondansetron (ZOFRAN) 4 MG tablet Take 1 tablet (4 mg total) by mouth every 6 (six) hours. 07/14/15   [name],  MD  oxyCODONE-acetaminophen (PERCOCET) 5-325 MG tablet Take 1 tablet by mouth every 4 (four) hours as needed. 07/14/15   [name], MD  oxyCODONE-acetaminophen (PERCOCET) 5-325 MG tablet Take 2 tablets by mouth every 4 (four) hours as needed. 07/14/15   [name], MD   BP 164/82 mmHg  Pulse 58  Temp(Src) 97.5 F (36.4 C) (Oral)  Resp 18  Ht 5\' 4"  (1.626 m)  Wt 160 lb (72.576 kg)  BMI 27.45 kg/m2  SpO2 100% Physical Exam  Constitutional: She is oriented to person, place, and time. She appears well-developed and well-nourished. No distress.  HENT:  Head: Normocephalic  and atraumatic.  Right Ear: Hearing normal.  Left Ear: Hearing normal.  Nose: Nose normal.  Mouth/Throat: Oropharynx is clear and moist and mucous membranes are normal.  Eyes: Conjunctivae and EOM are normal. Pupils are equal, round, and reactive to light.  Neck: Normal range of motion. Neck supple.  Cardiovascular: Regular rhythm, S1 normal and S2 normal.  Exam reveals no gallop and no friction rub.   No murmur heard. Pulmonary/Chest: Effort normal and breath sounds normal. No respiratory distress. She exhibits no tenderness.  Abdominal: Soft. Normal appearance and bowel sounds are normal. There is no hepatosplenomegaly. There is no tenderness. There is no rebound, no guarding, no tenderness at McBurney's point and negative Murphy's sign. No hernia.  Musculoskeletal: Normal range of motion.  Neurological: She is alert and oriented to person, place, and time. She has normal strength. No cranial nerve deficit or sensory deficit. Coordination normal. GCS eye subscore is 4. GCS verbal subscore is 5. GCS motor subscore is 6.  Skin: Skin is warm, dry and intact. No rash noted. No cyanosis.  Psychiatric: She has a normal mood and affect. Her speech is normal and behavior is normal. Thought content normal.  Nursing note and vitals reviewed.   ED Course  Procedures (including critical care time) DIAGNOSTIC STUDIES: Oxygen Saturation is 100% on RA, normal by my interpretation.    COORDINATION OF CARE: 11:08 PM-Discussed treatment plan which includes urinalysis, CT renal stone study, CBC with differential/platelet, and BMP with pt at bedside and pt agreed to plan.   Labs Review Labs Reviewed  URINALYSIS, ROUTINE W REFLEX MICROSCOPIC (NOT AT [facility]) - Abnormal; Notable for the following:    Color, Urine BROWN (*)    APPearance CLOUDY (*)    Hgb urine dipstick LARGE (*)    Bilirubin Urine SMALL (*)    Ketones, ur 40 (*)    Protein, ur 100 (*)    Leukocytes, UA TRACE (*)    All other components  within normal limits  URINE MICROSCOPIC-ADD ON - Abnormal; Notable for the following:    Squamous Epithelial / LPF 6-30 (*)    Bacteria, UA MANY (*)    All other components within normal limits  CBC WITH DIFFERENTIAL/PLATELET - Abnormal; Notable for the following:    WBC 11.3 (*)    Hemoglobin 11.5 (*)    RDW 15.7 (*)    Neutro Abs 9.9 (*)    All other components within normal limits  BASIC METABOLIC PANEL - Abnormal; Notable for the following:    Glucose, Bld 150 (*)    BUN 24 (*)    GFR calc non Af Amer 58 (*)    All other components within normal limits    Imaging Review Ct Renal Stone Study  07/14/2015  CLINICAL DATA:  Gradual onset constant severe right-sided abdominal pain since earlier tonight. Vomiting. History  of kidney stones. EXAM: CT ABDOMEN AND PELVIS WITHOUT CONTRAST TECHNIQUE: Multidetector CT imaging of the abdomen and pelvis was performed following the standard protocol without IV contrast. COMPARISON:  04/21/2015 FINDINGS: Atelectasis in the lung bases.  Coronary artery calcifications. There is right hydronephrosis and hydroureter. Diffuse stranding around the right kidney and ureter. No obstructing stones are demonstrated. Changes could represent sequela of a recently passed stone, obstruction due to a non radiopaque stone, stricture, or pyelonephritis. Left kidney and a ureter are unremarkable. Bladder is decompressed without wall thickening. No bladder stones identified. Unenhanced appearance of the liver, spleen, gallbladder, pancreas, adrenal glands, abdominal aorta, inferior vena cava, and retroperitoneal lymph nodes is unremarkable. Stomach and small bowel are decompressed. Diffusely stool-filled colon without abnormal distention. Scattered diverticula in the colon. No free air or free fluid in the abdomen. Pelvis: Mild prominence of right lower quadrant lymph nodes, likely reactive. Appendix is normal. Uterus is surgically absent. No pelvic mass or lymphadenopathy. No  free or loculated pelvic fluid collections. Diverticulosis of the sigmoid colon without evidence of diverticulitis. Degenerative changes in the spine and hips. No destructive bone lesions. IMPRESSION: Right hydronephrosis and hydroureter with stranding around the right kidney and ureter. No obstructing stones demonstrated. Differential diagnosis includes recently passed stone, obstruction due to non radiopaque stone or stricture, or pyelonephritis with reflux. Electronically Signed   By: Lucienne Capers M.D.   On: 07/14/2015 01:08   I have personally reviewed and evaluated these images and lab results as part of my medical decision-making.   EKG Interpretation None      MDM   Final diagnoses:  Renal colic on right side   Patient presents to the ER with complaints of sudden onset right flank pain, nausea and vomiting. She reports that the symptoms are similar to when she had a kidney stone in the past. She has had resolution of her pain here in the ER. She was given a single dose of Dilaudid. CT scan shows hydronephrosis and hydroureter without stone. Suspect that she passed a stone, although cannot rule out non-radiopaque stone. Patient will be provided analgesia and is to follow-up with her urologist, Dr. Diona Fanti by phone tomorrow.   I personally performed the services described in this documentation, which was scribed in my presence. The recorded information has been reviewed and is accurate.      [name], MD 07/14/15 [id number]

## 2015-07-14 DIAGNOSIS — N134 Hydroureter: Secondary | ICD-10-CM | POA: Diagnosis not present

## 2015-07-14 DIAGNOSIS — N23 Unspecified renal colic: Secondary | ICD-10-CM | POA: Diagnosis not present

## 2015-07-14 DIAGNOSIS — N1330 Unspecified hydronephrosis: Secondary | ICD-10-CM | POA: Diagnosis not present

## 2015-07-14 LAB — BASIC METABOLIC PANEL
ANION GAP: 10 (ref 5–15)
BUN: 24 mg/dL — AB (ref 6–20)
CALCIUM: 9.3 mg/dL (ref 8.9–10.3)
CHLORIDE: 107 mmol/L (ref 101–111)
CO2: 26 mmol/L (ref 22–32)
CREATININE: 0.97 mg/dL (ref 0.44–1.00)
GFR, EST NON AFRICAN AMERICAN: 58 mL/min — AB (ref >60)
Glucose, Bld: 150 mg/dL — ABNORMAL HIGH (ref 65–99)
POTASSIUM: 4.5 mmol/L (ref 3.5–5.1)
Sodium: 143 mmol/L (ref 135–145)

## 2015-07-14 MED ORDER — OXYCODONE-ACETAMINOPHEN 5-325 MG PO TABS: 1.0000 | ORAL_TABLET | ORAL | Status: DC | PRN

## 2015-07-14 MED ORDER — ONDANSETRON HCL 4 MG PO TABS: 4.0000 mg | ORAL_TABLET | Freq: Four times a day (QID) | ORAL | Status: DC

## 2015-07-14 MED ORDER — OXYCODONE-ACETAMINOPHEN 5-325 MG PO TABS: 2.0000 | ORAL_TABLET | ORAL | Status: DC | PRN

## 2015-07-14 NOTE — Discharge Instructions (Signed)
Kidney Stones °Kidney stones (urolithiasis) are deposits that form inside your kidneys. The intense pain is caused by the stone moving through the urinary tract. When the stone moves, the ureter goes into spasm around the stone. The stone is usually passed in the urine.  °CAUSES  °· A disorder that makes certain neck glands produce too much parathyroid hormone (primary hyperparathyroidism). °· A buildup of uric acid crystals, similar to gout in your joints. °· Narrowing (stricture) of the ureter. °· A kidney obstruction present at birth (congenital obstruction). °· Previous surgery on the kidney or ureters. °· Numerous kidney infections. °SYMPTOMS  °· Feeling sick to your stomach (nauseous). °· Throwing up (vomiting). °· Blood in the urine (hematuria). °· Pain that usually spreads (radiates) to the groin. °· Frequency or urgency of urination. °DIAGNOSIS  °· Taking a history and physical exam. °· Blood or urine tests. °· CT scan. °· Occasionally, an examination of the inside of the urinary bladder (cystoscopy) is performed. °TREATMENT  °· Observation. °· Increasing your fluid intake. °· Extracorporeal shock wave lithotripsy--This is a noninvasive procedure that uses shock waves to break up kidney stones. °· Surgery may be needed if you have severe pain or persistent obstruction. There are various surgical procedures. Most of the procedures are performed with the use of small instruments. Only small incisions are needed to accommodate these instruments, so recovery time is minimized. °The size, location, and chemical composition are all important variables that will determine the proper choice of action for you. Talk to your health care provider to better understand your situation so that you will minimize the risk of injury to yourself and your kidney.  °HOME CARE INSTRUCTIONS  °· Drink enough water and fluids to keep your urine clear or pale yellow. This will help you to pass the stone or stone fragments. °· Strain  all urine through the provided strainer. Keep all particulate matter and stones for your health care provider to see. The stone causing the pain may be as small as a grain of salt. It is very important to use the strainer each and every time you pass your urine. The collection of your stone will allow your health care provider to analyze it and verify that a stone has actually passed. The stone analysis will often identify what you can do to reduce the incidence of recurrences. °· Only take over-the-counter or prescription medicines for pain, discomfort, or fever as directed by your health care provider. °· Keep all follow-up visits as told by your health care provider. This is important. °· Get follow-up X-rays if required. The absence of pain does not always mean that the stone has passed. It may have only stopped moving. If the urine remains completely obstructed, it can cause loss of kidney function or even complete destruction of the kidney. It is your responsibility to make sure X-rays and follow-ups are completed. Ultrasounds of the kidney can show blockages and the status of the kidney. Ultrasounds are not associated with any radiation and can be performed easily in a matter of minutes. °· Make changes to your daily diet as told by your health care provider. You may be told to: °¨ Limit the amount of salt that you eat. °¨ Eat 5 or more servings of fruits and vegetables each day. °¨ Limit the amount of meat, poultry, fish, and eggs that you eat. °· Collect a 24-hour urine sample as told by your health care provider. You may need to collect another urine sample every 6-12   months. °SEEK MEDICAL CARE IF: °· You experience pain that is progressive and unresponsive to any pain medicine you have been prescribed. °SEEK IMMEDIATE MEDICAL CARE IF:  °· Pain cannot be controlled with the prescribed medicine. °· You have a fever or shaking chills. °· The severity or intensity of pain increases over 18 hours and is not  relieved by pain medicine. °· You develop a new onset of abdominal pain. °· You feel faint or pass out. °· You are unable to urinate. °  °This information is not intended to replace advice given to you by your health care provider. Make sure you discuss any questions you have with your health care provider. °  °Document Released: 04/25/2005 Document Revised: 01/14/2015 Document Reviewed: 09/26/2012 °[employer] Interactive Patient Education ©2016 [employer] ° °

## 2015-07-20 ENCOUNTER — Encounter: Payer: Self-pay | Admitting: *Deleted

## 2015-07-20 MED FILL — Oxycodone w/ Acetaminophen Tab 5-325 MG: ORAL | Qty: 6 | Status: AC

## 2015-07-21 ENCOUNTER — Ambulatory Visit (INDEPENDENT_AMBULATORY_CARE_PROVIDER_SITE_OTHER): Payer: Medicare Other | Admitting: Urology

## 2015-07-21 ENCOUNTER — Other Ambulatory Visit: Payer: Self-pay | Admitting: Urology

## 2015-07-21 DIAGNOSIS — N390 Urinary tract infection, site not specified: Secondary | ICD-10-CM | POA: Diagnosis not present

## 2015-07-21 DIAGNOSIS — N1330 Unspecified hydronephrosis: Secondary | ICD-10-CM

## 2015-07-21 DIAGNOSIS — D3002 Benign neoplasm of left kidney: Secondary | ICD-10-CM | POA: Diagnosis not present

## 2015-07-27 ENCOUNTER — Other Ambulatory Visit: Payer: Self-pay | Admitting: Family Medicine

## 2015-08-04 DIAGNOSIS — N200 Calculus of kidney: Secondary | ICD-10-CM | POA: Diagnosis not present

## 2015-08-11 DIAGNOSIS — H353211 Exudative age-related macular degeneration, right eye, with active choroidal neovascularization: Secondary | ICD-10-CM | POA: Diagnosis not present

## 2015-08-11 DIAGNOSIS — H353222 Exudative age-related macular degeneration, left eye, with inactive choroidal neovascularization: Secondary | ICD-10-CM | POA: Diagnosis not present

## 2015-08-26 ENCOUNTER — Ambulatory Visit (HOSPITAL_COMMUNITY)
Admission: RE | Admit: 2015-08-26 | Discharge: 2015-08-26 | Disposition: A | Discharge status: Home / Self Care | Payer: Medicare Other | Source: Ambulatory Visit | Attending: Urology | Admitting: Urology

## 2015-08-26 DIAGNOSIS — N1330 Unspecified hydronephrosis: Secondary | ICD-10-CM | POA: Diagnosis not present

## 2015-08-26 DIAGNOSIS — N289 Disorder of kidney and ureter, unspecified: Secondary | ICD-10-CM | POA: Diagnosis not present

## 2015-09-01 ENCOUNTER — Ambulatory Visit (INDEPENDENT_AMBULATORY_CARE_PROVIDER_SITE_OTHER): Payer: Medicare Other | Admitting: Urology

## 2015-09-01 DIAGNOSIS — D3002 Benign neoplasm of left kidney: Secondary | ICD-10-CM | POA: Diagnosis not present

## 2015-09-01 DIAGNOSIS — N1330 Unspecified hydronephrosis: Secondary | ICD-10-CM | POA: Diagnosis not present

## 2015-10-06 ENCOUNTER — Encounter: Payer: Self-pay | Admitting: Family Medicine

## 2015-10-06 ENCOUNTER — Ambulatory Visit (INDEPENDENT_AMBULATORY_CARE_PROVIDER_SITE_OTHER): Payer: Medicare Other | Admitting: Family Medicine

## 2015-10-06 VITALS — BP 140/78 | Ht 64.0 in | Wt 162.5 lb

## 2015-10-06 DIAGNOSIS — E785 Hyperlipidemia, unspecified: Secondary | ICD-10-CM | POA: Diagnosis not present

## 2015-10-06 DIAGNOSIS — I10 Essential (primary) hypertension: Secondary | ICD-10-CM | POA: Diagnosis not present

## 2015-10-06 DIAGNOSIS — E038 Other specified hypothyroidism: Secondary | ICD-10-CM

## 2015-10-06 NOTE — Progress Notes (Signed)
   Subjective:    Patient ID: Susan Mcneil, female    DOB: 02-15-1946, 70 y.o.   MRN: [id number]  Hypertension This is a chronic problem. The current episode started more than 1 year ago. The problem has been gradually improving since onset. Pertinent negatives include no chest pain. There are no associated agents to hypertension. There are no known risk factors for coronary artery disease. Treatments tried: lisinopril. The current treatment provides moderate improvement. There are no compliance problems.    Patient states that she has no concerns at this time.   Review of Systems  Constitutional: Negative for activity change, appetite change and fatigue.  HENT: Negative for congestion.   Respiratory: Negative for cough.   Cardiovascular: Negative for chest pain.  Gastrointestinal: Negative for abdominal pain.  Endocrine: Negative for polydipsia and polyphagia.  Neurological: Negative for weakness.  Psychiatric/Behavioral: Negative for confusion.       Objective:   Physical Exam  Constitutional: She appears well-nourished. No distress.  Cardiovascular: Normal rate, regular rhythm and normal heart sounds.   No murmur heard. Pulmonary/Chest: Effort normal and breath sounds normal. No respiratory distress.  Musculoskeletal: She exhibits no edema.  Lymphadenopathy:    She has no cervical adenopathy.  Neurological: She is alert. She exhibits normal muscle tone.  Psychiatric: Her behavior is normal.  Vitals reviewed.   Please the patient overall is doing well. Continue current medications. She states she still has some memory issues when questioned about this but she states she's functioning fine in the home and driving and doing crossword puzzles etc. She will follow-up in 6 months      Assessment & Plan:  HTN- Patient was seen today as part of a visit regarding hypertension. The importance of healthy diet and regular physical activity was discussed. The importance of compliance  with medications discussed. Ideal goal is to keep blood pressure low elevated levels certainly below [id number] when possible. The patient was counseled that keeping blood pressure under control lessen his risk of heart attack, stroke, kidney failure, and early death. The importance of regular follow-ups was discussed with the patient. Low-salt diet such as DASH recommended. Regular physical activity was recommended as well. Patient was advised to keep regular follow-ups.

## 2015-10-07 ENCOUNTER — Encounter: Payer: Self-pay | Admitting: Family Medicine

## 2015-10-07 LAB — HEPATIC FUNCTION PANEL
ALK PHOS: 75 IU/L (ref 39–117)
ALT: 13 IU/L (ref 0–32)
AST: 25 IU/L (ref 0–40)
Albumin: 4.2 g/dL (ref 3.5–4.8)
BILIRUBIN, DIRECT: 0.04 mg/dL (ref 0.00–0.40)
Bilirubin Total: <0.2 mg/dL (ref 0.0–1.2)
TOTAL PROTEIN: 6.6 g/dL (ref 6.0–8.5)

## 2015-10-07 LAB — LIPID PANEL
CHOLESTEROL TOTAL: 148 mg/dL (ref 100–199)
Chol/HDL Ratio: 2.1 ratio units (ref 0.0–4.4)
HDL: 72 mg/dL (ref >39)
LDL Calculated: 48 mg/dL (ref 0–99)
Triglycerides: 141 mg/dL (ref 0–149)
VLDL Cholesterol Cal: 28 mg/dL (ref 5–40)

## 2015-10-07 LAB — BASIC METABOLIC PANEL
BUN / CREAT RATIO: 33 — AB (ref 12–28)
BUN: 23 mg/dL (ref 8–27)
CO2: 23 mmol/L (ref 18–29)
CREATININE: 0.7 mg/dL (ref 0.57–1.00)
Calcium: 9.1 mg/dL (ref 8.7–10.3)
Chloride: 106 mmol/L (ref 96–106)
GFR calc Af Amer: 102 mL/min/{1.73_m2} (ref >59)
GFR, EST NON AFRICAN AMERICAN: 88 mL/min/{1.73_m2} (ref >59)
GLUCOSE: 92 mg/dL (ref 65–99)
Potassium: 4.3 mmol/L (ref 3.5–5.2)
SODIUM: 147 mmol/L — AB (ref 134–144)

## 2015-10-07 LAB — TSH: TSH: 1.7 u[IU]/mL (ref 0.450–4.500)

## 2015-10-22 DIAGNOSIS — H18413 Arcus senilis, bilateral: Secondary | ICD-10-CM | POA: Diagnosis not present

## 2015-10-22 DIAGNOSIS — H401130 Primary open-angle glaucoma, bilateral, stage unspecified: Secondary | ICD-10-CM | POA: Diagnosis not present

## 2015-10-22 DIAGNOSIS — H353130 Nonexudative age-related macular degeneration, bilateral, stage unspecified: Secondary | ICD-10-CM | POA: Diagnosis not present

## 2015-10-22 DIAGNOSIS — H2513 Age-related nuclear cataract, bilateral: Secondary | ICD-10-CM | POA: Diagnosis not present

## 2015-10-26 DIAGNOSIS — H353211 Exudative age-related macular degeneration, right eye, with active choroidal neovascularization: Secondary | ICD-10-CM | POA: Diagnosis not present

## 2015-12-02 DIAGNOSIS — Z803 Family history of malignant neoplasm of breast: Secondary | ICD-10-CM | POA: Diagnosis not present

## 2015-12-02 DIAGNOSIS — Z1231 Encounter for screening mammogram for malignant neoplasm of breast: Secondary | ICD-10-CM | POA: Diagnosis not present

## 2015-12-03 ENCOUNTER — Other Ambulatory Visit: Payer: Self-pay | Admitting: Family Medicine

## 2015-12-21 ENCOUNTER — Encounter: Payer: Self-pay | Admitting: Family Medicine

## 2016-01-28 DIAGNOSIS — H401130 Primary open-angle glaucoma, bilateral, stage unspecified: Secondary | ICD-10-CM | POA: Diagnosis not present

## 2016-02-01 DIAGNOSIS — H353211 Exudative age-related macular degeneration, right eye, with active choroidal neovascularization: Secondary | ICD-10-CM | POA: Diagnosis not present

## 2016-03-10 ENCOUNTER — Ambulatory Visit (INDEPENDENT_AMBULATORY_CARE_PROVIDER_SITE_OTHER): Payer: Medicare Other | Admitting: *Deleted

## 2016-03-10 DIAGNOSIS — Z23 Encounter for immunization: Secondary | ICD-10-CM

## 2016-03-11 ENCOUNTER — Other Ambulatory Visit: Payer: Self-pay | Admitting: Family Medicine

## 2016-04-11 ENCOUNTER — Other Ambulatory Visit: Payer: Self-pay | Admitting: Family Medicine

## 2016-04-11 ENCOUNTER — Encounter: Payer: Self-pay | Admitting: Family Medicine

## 2016-04-11 ENCOUNTER — Ambulatory Visit (INDEPENDENT_AMBULATORY_CARE_PROVIDER_SITE_OTHER): Payer: Medicare Other | Admitting: Family Medicine

## 2016-04-11 VITALS — BP 120/82 | Temp 98.2°F | Ht 64.0 in | Wt 173.0 lb

## 2016-04-11 DIAGNOSIS — E784 Other hyperlipidemia: Secondary | ICD-10-CM | POA: Diagnosis not present

## 2016-04-11 DIAGNOSIS — Z23 Encounter for immunization: Secondary | ICD-10-CM

## 2016-04-11 DIAGNOSIS — E7849 Other hyperlipidemia: Secondary | ICD-10-CM

## 2016-04-11 DIAGNOSIS — I10 Essential (primary) hypertension: Secondary | ICD-10-CM | POA: Diagnosis not present

## 2016-04-11 DIAGNOSIS — J069 Acute upper respiratory infection, unspecified: Secondary | ICD-10-CM

## 2016-04-11 DIAGNOSIS — E038 Other specified hypothyroidism: Secondary | ICD-10-CM

## 2016-04-11 DIAGNOSIS — B9789 Other viral agents as the cause of diseases classified elsewhere: Secondary | ICD-10-CM | POA: Diagnosis not present

## 2016-04-11 DIAGNOSIS — Z79899 Other long term (current) drug therapy: Secondary | ICD-10-CM | POA: Diagnosis not present

## 2016-04-11 NOTE — Progress Notes (Signed)
   Subjective:    Patient ID: Susan Mcneil, female    DOB: 1945/10/25, 70 y.o.   MRN: [id number]  Hypertension  This is a chronic problem. The current episode started in the past 7 days. Pertinent negatives include no chest pain, headaches or shortness of breath.   Pt has concerns on if she is suppose to continue taking potassium.   Scratchy throat for 3 -4 days. Is a I hous going reason those likely doen'tsem ike it was Christmastime doesn't live  this patient is ha mild amount of conestion coughing denies any wheezing denies an fever chills  Patient does take her thyroid medicine as directed denies missing it.She takes her cholesterol medicine as directed she watches the fats in her di previous labs were reviewed with the patient Patient denies any memory decision is. Denies any driving issues. Review of Systems  Constitutional: Negative for activity change, fatigue and fever.  HENT: Positive for congestion and rhinorrhea. Negative for ear pain.   Eyes: Negative for discharge.  Respiratory: Negative for cough, shortness of breath and wheezing.   Cardiovascular: Negative for chest pain and leg swelling.  Neurological: Negative for headaches.       Objective:   Physical Exam  Constitutional: She appears well-nourished. No distress.  Cardiovascular: Normal rate, regular rhythm and normal heart sounds.   No murmur heard. Pulmonary/Chest: Effort normal and breath sounds normal. No respiratory distress.  Musculoskeletal: She exhibits no edema.  Lymphadenopathy:    She has no cervical adenopathy.  Neurological: She is alert. She exhibits normal muscle tone.  Psychiatric: Her behavior is normal.  Vitals reviewed.   Focus overall is good. Immediate recall 3 for 3. Clock drawing good. After clock drawing unable to recall items.      Assessment & Plan:   Hypertension good control continue current measures watch diet closely  Stop potassium  Hypothyroidism continue thyroid  medicine  Hyperlipidemia previous labs reviewed with patient continue current medication watch diet  Viral URI suppasures.  Cognitive dysfunction not severe enough to be on medicatioitor

## 2016-04-11 NOTE — Patient Instructions (Signed)
Mild viral illness-I belies This will gradually run its own course over the next 5-6 days if you start running high fevers excessive coughing a lot worse please call us.   stop taking potassium  Please do your lab work  We will send you a  Reminder to follow-up in 6 months.

## 2016-04-12 ENCOUNTER — Telehealth: Payer: Self-pay | Admitting: Family Medicine

## 2016-04-12 LAB — LIPID PANEL
CHOL/HDL RATIO: 2.3 ratio (ref 0.0–4.4)
Cholesterol, Total: 186 mg/dL (ref 100–199)
HDL: 80 mg/dL (ref >39)
LDL CALC: 83 mg/dL (ref 0–99)
Triglycerides: 115 mg/dL (ref 0–149)
VLDL CHOLESTEROL CAL: 23 mg/dL (ref 5–40)

## 2016-04-12 LAB — HEPATIC FUNCTION PANEL
ALBUMIN: 4.6 g/dL (ref 3.5–4.8)
ALT: 16 IU/L (ref 0–32)
AST: 23 IU/L (ref 0–40)
Alkaline Phosphatase: 89 IU/L (ref 39–117)
Bilirubin Total: 0.2 mg/dL (ref 0.0–1.2)
Bilirubin, Direct: 0.09 mg/dL (ref 0.00–0.40)
TOTAL PROTEIN: 7.4 g/dL (ref 6.0–8.5)

## 2016-04-12 LAB — BASIC METABOLIC PANEL
BUN/Creatinine Ratio: 18 (ref 12–28)
BUN: 14 mg/dL (ref 8–27)
CALCIUM: 9.4 mg/dL (ref 8.7–10.3)
CO2: 24 mmol/L (ref 18–29)
CREATININE: 0.76 mg/dL (ref 0.57–1.00)
Chloride: 104 mmol/L (ref 96–106)
GFR calc Af Amer: 91 mL/min/{1.73_m2} (ref >59)
GFR, EST NON AFRICAN AMERICAN: 79 mL/min/{1.73_m2} (ref >59)
Glucose: 89 mg/dL (ref 65–99)
Potassium: 4.5 mmol/L (ref 3.5–5.2)
Sodium: 145 mmol/L — ABNORMAL HIGH (ref 134–144)

## 2016-04-12 LAB — TSH: TSH: 34.97 u[IU]/mL — ABNORMAL HIGH (ref 0.450–4.500)

## 2016-04-12 NOTE — Telephone Encounter (Signed)
Pt is needing refills sent in on levothyroxine (SYNTHROID, LEVOTHROID) 75 MCG tablet   [facility]

## 2016-04-12 NOTE — Telephone Encounter (Signed)
Left message on voicemail notifying patient that refills was sent to [facility] yesterday for this med.

## 2016-04-25 DIAGNOSIS — H35051 Retinal neovascularization, unspecified, right eye: Secondary | ICD-10-CM | POA: Diagnosis not present

## 2016-04-25 DIAGNOSIS — H353211 Exudative age-related macular degeneration, right eye, with active choroidal neovascularization: Secondary | ICD-10-CM | POA: Diagnosis not present

## 2016-04-25 DIAGNOSIS — H35052 Retinal neovascularization, unspecified, left eye: Secondary | ICD-10-CM | POA: Diagnosis not present

## 2016-04-25 DIAGNOSIS — H353222 Exudative age-related macular degeneration, left eye, with inactive choroidal neovascularization: Secondary | ICD-10-CM | POA: Diagnosis not present

## 2016-04-25 DIAGNOSIS — H3570 Unspecified separation of retinal layers: Secondary | ICD-10-CM | POA: Diagnosis not present

## 2016-04-28 DIAGNOSIS — H401133 Primary open-angle glaucoma, bilateral, severe stage: Secondary | ICD-10-CM | POA: Diagnosis not present

## 2016-05-05 ENCOUNTER — Other Ambulatory Visit: Payer: Self-pay | Admitting: *Deleted

## 2016-05-05 DIAGNOSIS — E039 Hypothyroidism, unspecified: Secondary | ICD-10-CM

## 2016-05-05 MED ORDER — LEVOTHYROXINE SODIUM 100 MCG PO TABS: 75.0000 ug | ORAL_TABLET | Freq: Every day | ORAL | 5 refills | Status: DC

## 2016-05-06 ENCOUNTER — Other Ambulatory Visit: Payer: Self-pay | Admitting: Family Medicine

## 2016-05-30 ENCOUNTER — Ambulatory Visit (INDEPENDENT_AMBULATORY_CARE_PROVIDER_SITE_OTHER): Payer: Medicare Other | Admitting: Nurse Practitioner

## 2016-05-30 VITALS — BP 168/98 | Temp 98.0°F | Wt 173.0 lb

## 2016-05-30 DIAGNOSIS — R21 Rash and other nonspecific skin eruption: Secondary | ICD-10-CM | POA: Diagnosis not present

## 2016-05-30 MED ORDER — TRIAMCINOLONE ACETONIDE 0.1 % EX CREA: 1.0000 "application " | TOPICAL_CREAM | Freq: Two times a day (BID) | CUTANEOUS | 0 refills | Status: DC

## 2016-05-30 MED ORDER — CEPHALEXIN 500 MG PO CAPS: 500.0000 mg | ORAL_CAPSULE | Freq: Four times a day (QID) | ORAL | 0 refills | Status: DC

## 2016-05-31 ENCOUNTER — Encounter: Payer: Self-pay | Admitting: Nurse Practitioner

## 2016-05-31 NOTE — Progress Notes (Signed)
Subjective:  Presents for c/o rash on the chest area that began 3 days ago. Pruritic. Slight burning at times, but non tender. No fever. No known allergens. No known contacts. Completed a course of Amoxil and finishing a course of Clindamycin for upcoming dental surgery. Stopped Clindamycin a couple of days ago.   Objective:   BP (!) 168/98   Temp 98 F (36.7 C) (Oral)   Wt 173 lb (78.5 kg)   BMI 29.70 kg/m  NAD. Alert, oriented. Multiple discrete erythematous papules on the anterior chest area with a few tiny pustules. Non tender.   Assessment:  Rash and nonspecific skin eruption    Plan:  Meds ordered this encounter  Medications  . cephALEXin (KEFLEX) 500 MG capsule    Sig: Take 1 capsule (500 mg total) by mouth 4 (four) times daily.    Dispense:  28 capsule    Refill:  0    Order Specific Question:   Supervising Provider    Answer:   Mikey Kirschner [[id number]]  . triamcinolone cream (KENALOG) 0.1 %    Sig: Apply 1 application topically 2 (two) times daily. Prn rash; use up to 2 weeks    Dispense:  30 g    Refill:  0    Order Specific Question:   Supervising Provider    Answer:   Mikey Kirschner [[id number]]   Hold on Clindamycin. Review of side effects includes possible pustular rash. Call back in 72 hours if no improvement, sooner if worse. Patient to notify dental surgeon regarding meds prescribed today.

## 2016-06-18 ENCOUNTER — Other Ambulatory Visit: Payer: Self-pay | Admitting: Family Medicine

## 2016-06-25 IMAGING — US US RENAL
1 series · 14 of 25 positions shown · non-contrast
Comparison: CT 07/14/2015

CLINICAL DATA: 69-year-old female with a history of hydronephrosis.

EXAM:
RENAL / URINARY TRACT ULTRASOUND COMPLETE

[Series 1: us renal · 0.25mm/px · 14 of 49 slices shown]
[im 1/49]
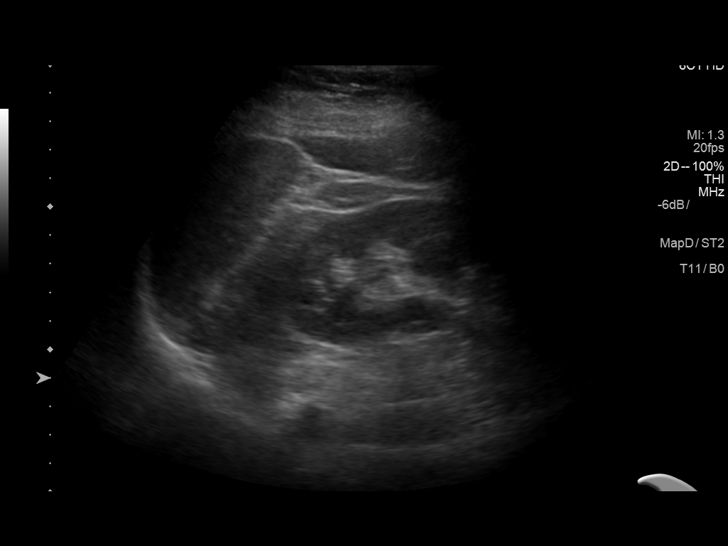
[im 5/49]
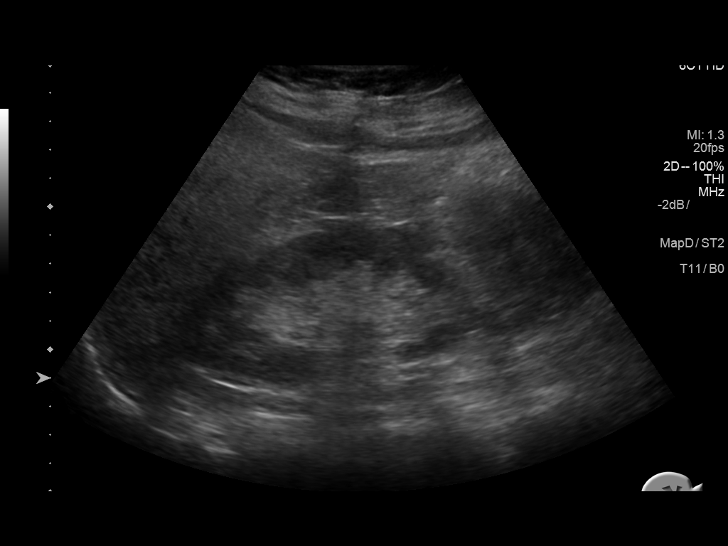
[im 9/49]
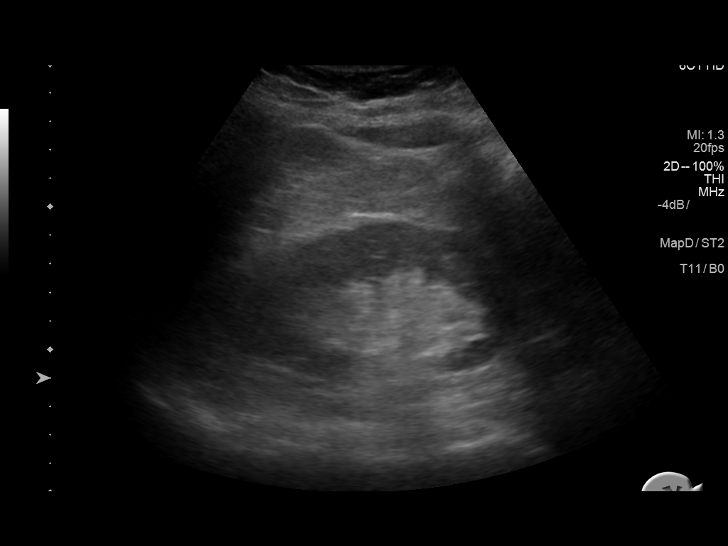
[im 13/49]
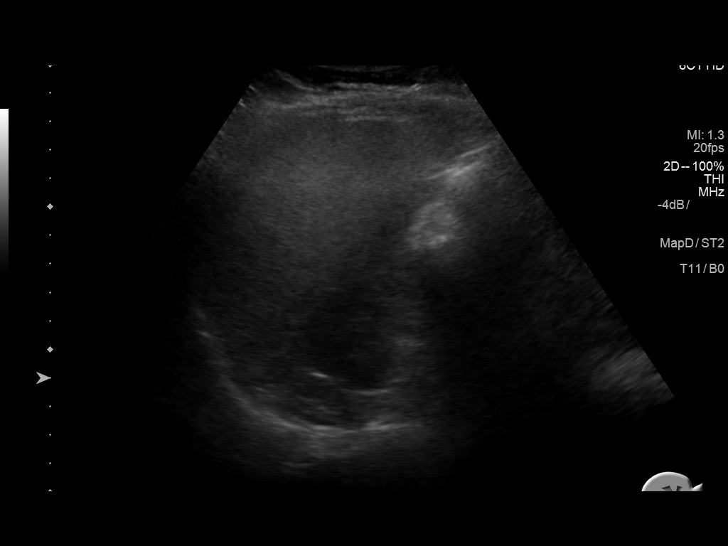
[im 17/49]
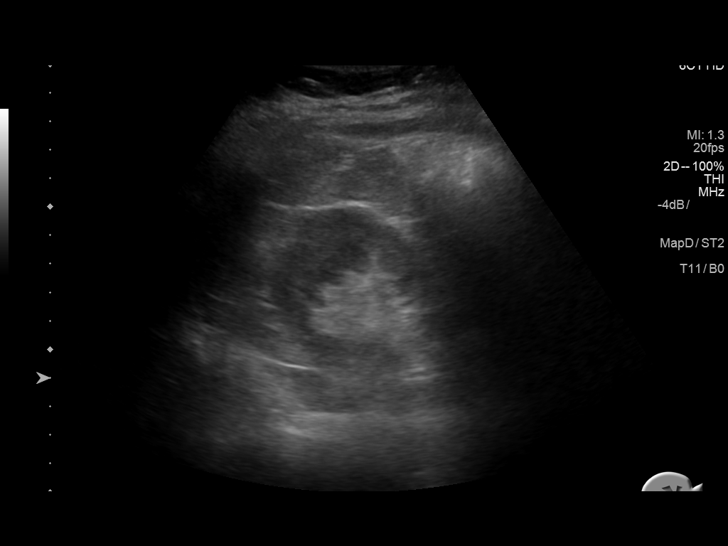
[im 19/49]
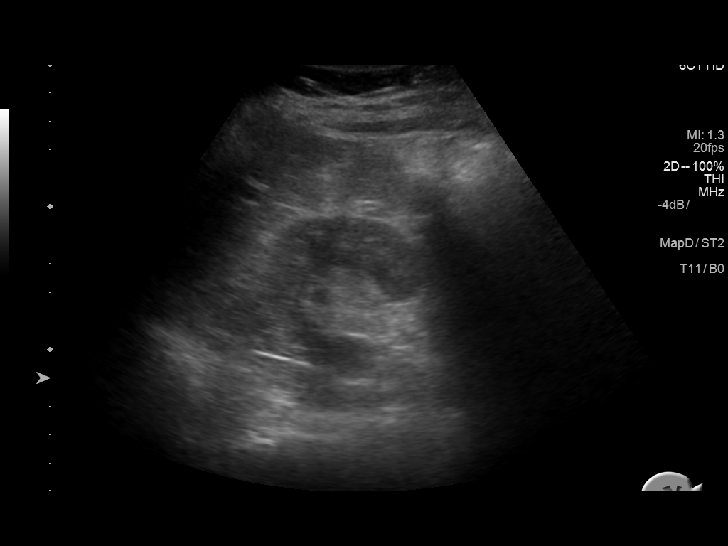
[im 23/49]
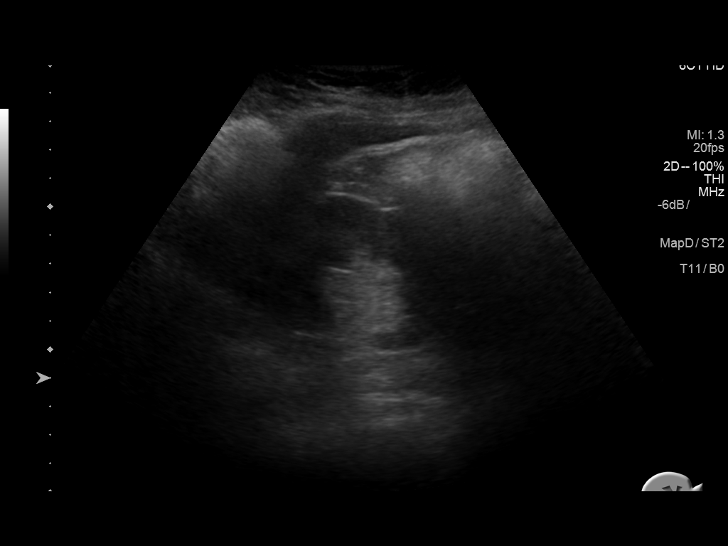
[im 27/49]
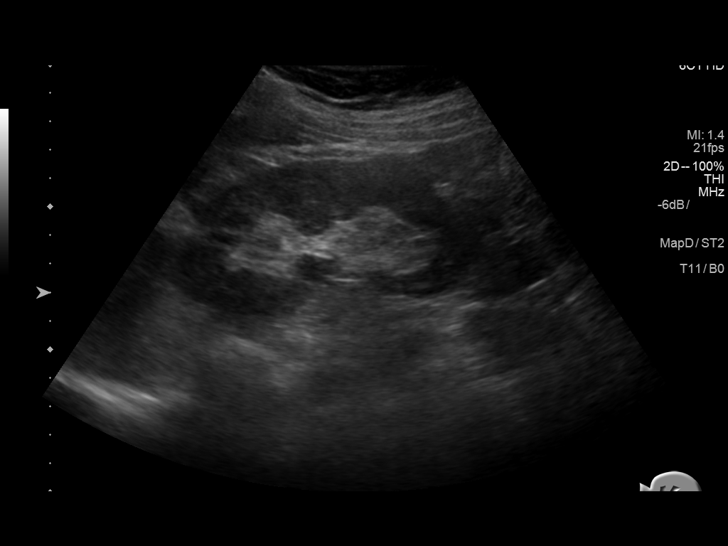
[im 31/49]
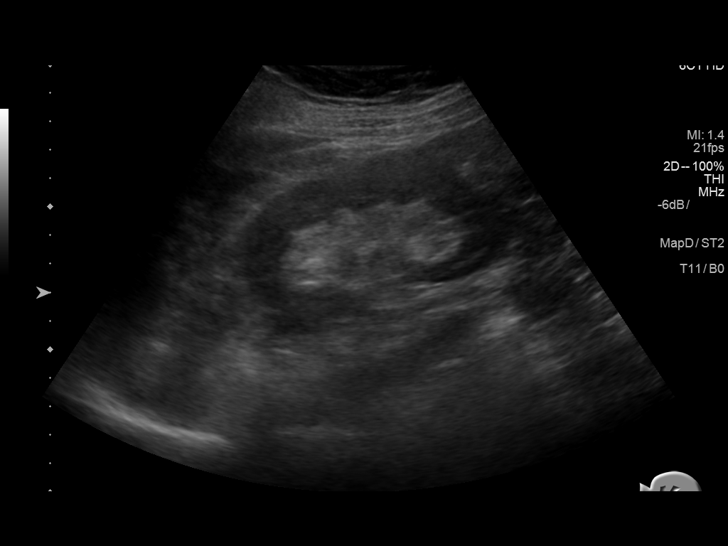
[im 33/49]
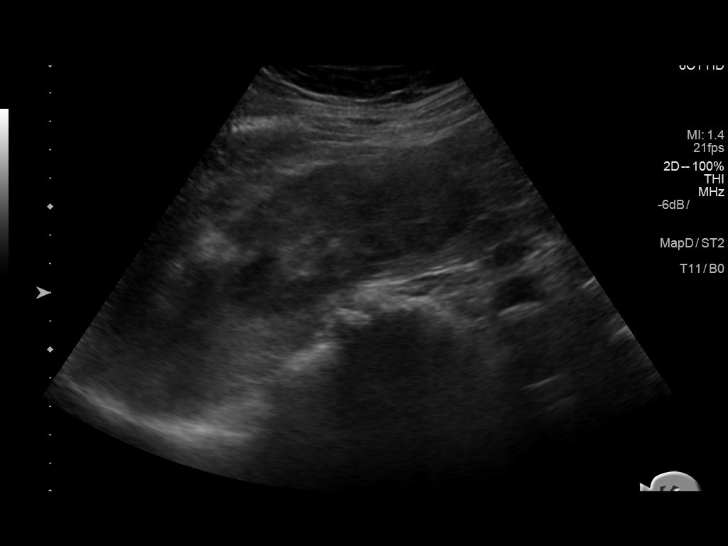
[im 37/49]
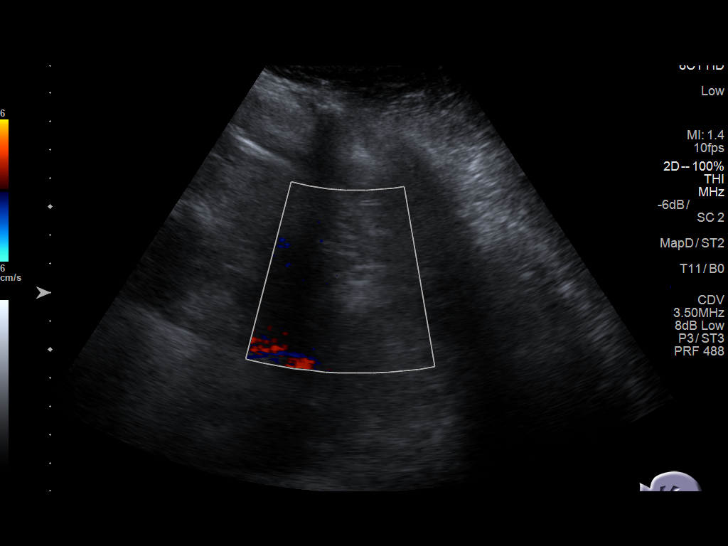
[im 41/49]
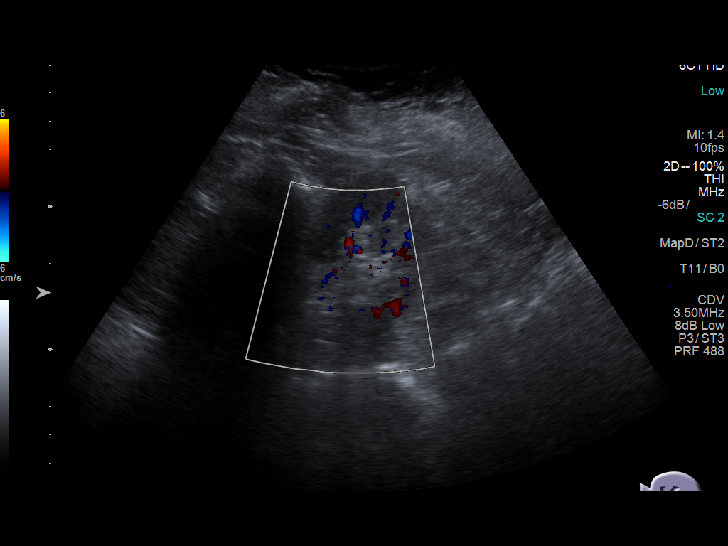
[im 45/49]
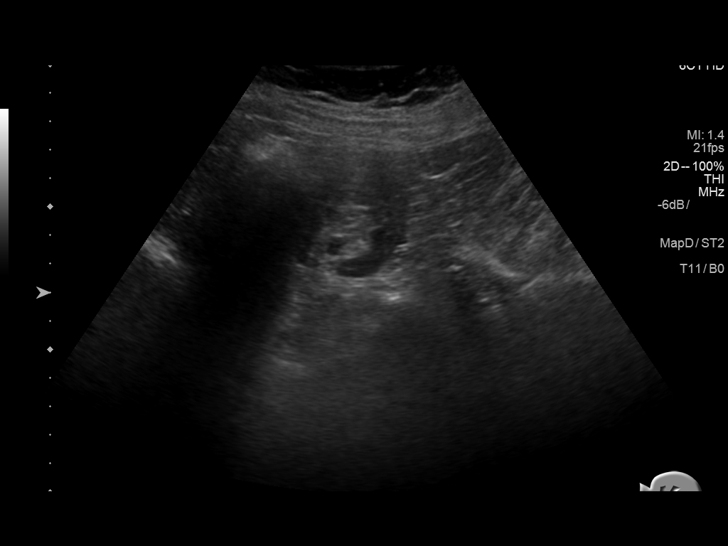
[im 49/49]
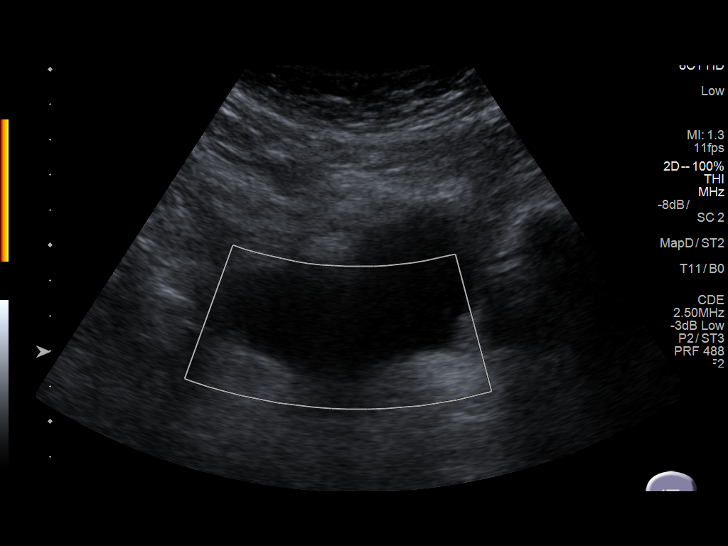

[14 of 25 positions shown; findings below may reference images not displayed]

FINDINGS: Right Kidney:

Length: 11.4 cm. Echogenicity of the right kidney less than that of
the adjacent liver parenchyma. No evidence of hydronephrosis. Flow
confirmed within the parenchyma of the right kidney. No echogenic
foci identified. No perinephric fluid.

Left Kidney:

Length: 11.3 cm. Echogenicity of the left kidney similar to that of
the adjacent spleen. No hydronephrosis. Flow confirmed within the
parenchyma of the left kidney.

Rounded hyperechoic lesion at the superior cortex of the right
kidney measures 1.9 cm x 1.5 cm. This corresponds to a fat
containing lesion at the superior cortex on the prior CT.

Bladder:

Appears normal for degree of bladder distention. Patient unable to
avoid, and thus no post void residual was measured.
IMPRESSION: Sonographic survey of the right kidney unremarkable, with no
evidence of hydronephrosis.

Hyperechoic lesion at the superior left kidney, most compatible with
a small angiomyolipoma.

## 2016-06-28 ENCOUNTER — Encounter: Payer: Self-pay | Admitting: Family Medicine

## 2016-06-28 ENCOUNTER — Ambulatory Visit (INDEPENDENT_AMBULATORY_CARE_PROVIDER_SITE_OTHER): Payer: Medicare Other | Admitting: Family Medicine

## 2016-06-28 VITALS — BP 122/84 | Ht 64.0 in | Wt 175.0 lb

## 2016-06-28 DIAGNOSIS — D509 Iron deficiency anemia, unspecified: Secondary | ICD-10-CM

## 2016-06-28 DIAGNOSIS — E038 Other specified hypothyroidism: Secondary | ICD-10-CM | POA: Diagnosis not present

## 2016-06-28 NOTE — Progress Notes (Signed)
   Subjective:    Patient ID: Susan Mcneil, female    DOB: 1945-06-26, 71 y.o.   MRN: [id number]  HPI  Patient arrives because she was unable to give blood due to low hemoglobin. Hemoglobin was also low on blood work in hospital 07/2105 This patient is up-to-date on colonoscopy She denies rectal bleeding Denies vomiting denies NSAID use denies epigastric pain denies weight loss no change in appetite. Energy level overall doing good. Here with her husband today  [employer] refused to take her blood she states one level was 83.35-year-old is 10.7 Review of Systems C per above no fevers chills sweats no night sweats no abdominal pain or chest pain    Objective:   Physical Exam  Neck no masses lungs are clear hearts regular pulse normal BP good conjunctiva appears normal      Assessment & Plan:  Anemia-hemoglobin 6 several years ago was 14 then 12 then last check of his 11.5 we will repeat CBC ferritin TIBC await the results may need stool studies may need referral to possibly gastroenterology or hematology depending on results  Hypothyroidism continue medication check TSH

## 2016-06-29 LAB — FERRITIN: Ferritin: 9 ng/mL — ABNORMAL LOW (ref 15–150)

## 2016-06-29 LAB — CBC WITH DIFFERENTIAL/PLATELET
Basophils Absolute: 0 10*3/uL (ref 0.0–0.2)
Basos: 0 %
EOS (ABSOLUTE): 0.1 10*3/uL (ref 0.0–0.4)
Eos: 2 %
HEMOGLOBIN: 12.2 g/dL (ref 11.1–15.9)
Hematocrit: 39.6 % (ref 34.0–46.6)
IMMATURE GRANS (ABS): 0 10*3/uL (ref 0.0–0.1)
Immature Granulocytes: 0 %
LYMPHS: 29 %
Lymphocytes Absolute: 1.9 10*3/uL (ref 0.7–3.1)
MCH: 24.7 pg — ABNORMAL LOW (ref 26.6–33.0)
MCHC: 30.8 g/dL — AB (ref 31.5–35.7)
MCV: 80 fL (ref 79–97)
MONOCYTES: 6 %
Monocytes Absolute: 0.4 10*3/uL (ref 0.1–0.9)
NEUTROS PCT: 63 %
Neutrophils Absolute: 4.1 10*3/uL (ref 1.4–7.0)
PLATELETS: 282 10*3/uL (ref 150–379)
RBC: 4.94 x10E6/uL (ref 3.77–5.28)
RDW: 16.4 % — ABNORMAL HIGH (ref 12.3–15.4)
WBC: 6.6 10*3/uL (ref 3.4–10.8)

## 2016-06-29 LAB — IRON AND TIBC
Iron Saturation: 12 — ABNORMAL LOW (ref 15–55)
Iron: 52 ug/dL (ref 27–139)
TIBC: 434 (ref 250–450)
UIBC: 382 ug/dL — ABNORMAL HIGH (ref 118–369)

## 2016-06-30 NOTE — Addendum Note (Signed)
Addended by: Carmelina Noun on: 06/30/2016 09:23 AM   Modules accepted: Orders

## 2016-07-04 ENCOUNTER — Encounter: Payer: Self-pay | Admitting: Family Medicine

## 2016-07-14 ENCOUNTER — Other Ambulatory Visit (INDEPENDENT_AMBULATORY_CARE_PROVIDER_SITE_OTHER): Payer: Self-pay | Admitting: *Deleted

## 2016-07-14 ENCOUNTER — Encounter (INDEPENDENT_AMBULATORY_CARE_PROVIDER_SITE_OTHER): Payer: Self-pay | Admitting: *Deleted

## 2016-07-14 ENCOUNTER — Ambulatory Visit (INDEPENDENT_AMBULATORY_CARE_PROVIDER_SITE_OTHER): Payer: Medicare Other | Admitting: Internal Medicine

## 2016-07-14 ENCOUNTER — Encounter (INDEPENDENT_AMBULATORY_CARE_PROVIDER_SITE_OTHER): Payer: Self-pay | Admitting: Internal Medicine

## 2016-07-14 VITALS — BP 140/80 | HR 60 | Temp 97.8°F | Ht 64.0 in | Wt 176.6 lb

## 2016-07-14 DIAGNOSIS — D508 Other iron deficiency anemias: Secondary | ICD-10-CM | POA: Diagnosis not present

## 2016-07-14 NOTE — Progress Notes (Signed)
Subjective:    Patient ID: Susan Mcneil, female    DOB: 1945/09/28, 71 y.o.   MRN: [id number]  HPIReferred by Dr. Sallee Lange for anemia.  Ferritin noted to be low at 9. No previous labs for comparison. Her appetite is good. No weight loss. No abdominal pain. She denies any heartburn.  She has a BM one a day or every 2 days. She has not seen any BRRB or melena.  No change in her stools.  She had a colonoscopy in November of 2016 (Hx of colon polyps) which revealed Impression:  Examination performed to cecum. Small polyps ablated via cold biopsy from hepatic flexure and submitted together. Mild sigmoid colon diverticulosis. Small external hemorrhoids. Both polyps are sessile serrated polyps. Next colonoscopy in 5 years.    Iron/TIBC/Ferritin/ %Sat    Component Value Date/Time   IRON 52 06/28/2016 0857   TIBC 434 06/28/2016 0857   FERRITIN 9 (L) 06/28/2016 0857   IRONPCTSAT 12 (L) 06/28/2016 0857   CBC Latest Ref Rng & Units 06/28/2016 07/13/2015 04/11/2015  WBC 3.4 - 10.8 x10E3/uL 6.6 11.3(H) 9.2  Hemoglobin 12.0 - 15.0 g/dL - 11.5(L) 12.3  Hematocrit 34.0 - 46.6 % 39.6 37.0 37.7  Platelets 150 - 379 x10E3/uL 282 274 285      CBC    Component Value Date/Time   WBC 6.6 06/28/2016 0857   WBC 11.3 (H) 07/13/2015 2328   RBC 4.94 06/28/2016 0857   RBC 4.32 07/13/2015 2328   HGB 11.5 (L) 07/13/2015 2328   HCT 39.6 06/28/2016 0857   PLT 282 06/28/2016 0857   MCV 80 06/28/2016 0857   MCH 24.7 (L) 06/28/2016 0857   MCH 26.6 07/13/2015 2328   MCHC 30.8 (L) 06/28/2016 0857   MCHC 31.1 07/13/2015 2328   RDW 16.4 (H) 06/28/2016 0857   LYMPHSABS 1.9 06/28/2016 0857   MONOABS 0.4 07/13/2015 2328   EOSABS 0.1 06/28/2016 0857   BASOSABS 0.0 06/28/2016 0857       Review of Systems Past Medical History:  Diagnosis Date  . Fatty liver   . HTN (hypertension)    cholesterol & thyroid  . Hypothyroidism   . Kidney stone   . Macular degeneration   . Macular degeneration,  bilateral   . Memory disturbance   . Osteopenia   . Osteoporosis   . Reflux gastritis   . Thyroid disorder     Past Surgical History:  Procedure Laterality Date  . ABDOMINAL HYSTERECTOMY   patial, pre-uterine cancer, 1974  . COLONOSCOPY N/A 04/01/2015   Procedure: COLONOSCOPY;  Surgeon: Rogene Houston, MD;  Location: [facility];  Service: Endoscopy;  Laterality: N/A;  1030  . DIAGNOSTIC LAPAROSCOPY    . TOTAL ABDOMINAL HYSTERECTOMY W/ BILATERAL SALPINGOOPHORECTOMY      No Known Allergies  Current Outpatient Prescriptions on File Prior to Visit  Medication Sig Dispense Refill  . Calcium Citrate (CITRACAL PO) Take 2 tablets by mouth daily.    . CRESTOR 10 MG tablet TAKE 1 TABLET BY MOUTH EVERY NIGHT AT BEDTIME 30 tablet 0  . levothyroxine (SYNTHROID, LEVOTHROID) 100 MCG tablet Take 1 tablet (100 mcg total) by mouth daily. 30 tablet 5  . Multiple Vitamins-Minerals ([facility] MACULAR HEALTH PO) Take 1 tablet by mouth 2 (two) times daily.     . Travoprost, BAK Free, (TRAVATAN) 0.004 % SOLN ophthalmic solution Place 1 drop into the left eye at bedtime.    Marland Kitchen UNKNOWN TO PATIENT Place into the right eye every  8 (eight) weeks. Injection for macular degeneration     No current facility-administered medications on file prior to visit.        Objective:   Physical Exam Blood pressure 140/80, pulse 60, temperature 97.8 F (36.6 C), height 5\' 4"  (1.626 m), weight 176 lb 9.6 oz (80.1 kg). Alert and oriented. Skin warm and dry. Oral mucosa is moist.   . Sclera anicteric, conjunctivae is pink. Thyroid not enlarged. No cervical lymphadenopathy. Lungs clear. Heart regular rate and rhythm.  Abdomen is soft. Bowel sounds are positive. No hepatomegaly. No abdominal masses felt. No tenderness.  No edema to lower extremities.Stool brown and guaiac negative.        Assessment & Plan:  IDA. Am going to send 3 stools cards home with patient. If any stool cards are positive, may need a colonoscopy or EGD.  I will discuss with Dr Laural Golden CBC in 3 weeks.

## 2016-07-14 NOTE — Patient Instructions (Signed)
Three stool cards home with patient.  CBC in 3 weeks.

## 2016-07-18 DIAGNOSIS — H35052 Retinal neovascularization, unspecified, left eye: Secondary | ICD-10-CM | POA: Diagnosis not present

## 2016-07-18 DIAGNOSIS — H353222 Exudative age-related macular degeneration, left eye, with inactive choroidal neovascularization: Secondary | ICD-10-CM | POA: Diagnosis not present

## 2016-07-18 DIAGNOSIS — H353211 Exudative age-related macular degeneration, right eye, with active choroidal neovascularization: Secondary | ICD-10-CM | POA: Diagnosis not present

## 2016-07-18 DIAGNOSIS — H353221 Exudative age-related macular degeneration, left eye, with active choroidal neovascularization: Secondary | ICD-10-CM | POA: Diagnosis not present

## 2016-07-18 DIAGNOSIS — H35051 Retinal neovascularization, unspecified, right eye: Secondary | ICD-10-CM | POA: Diagnosis not present

## 2016-07-19 ENCOUNTER — Telehealth (INDEPENDENT_AMBULATORY_CARE_PROVIDER_SITE_OTHER): Payer: Self-pay | Admitting: Internal Medicine

## 2016-07-19 ENCOUNTER — Telehealth (INDEPENDENT_AMBULATORY_CARE_PROVIDER_SITE_OTHER): Payer: Self-pay | Admitting: *Deleted

## 2016-07-19 DIAGNOSIS — D508 Other iron deficiency anemias: Secondary | ICD-10-CM

## 2016-07-19 DIAGNOSIS — Z86010 Personal history of colonic polyps: Secondary | ICD-10-CM | POA: Diagnosis not present

## 2016-07-19 NOTE — Telephone Encounter (Signed)
No answer at home. Will call tomorrow I added a ferrtin plus a CBC

## 2016-07-19 NOTE — Telephone Encounter (Signed)
   Diagnosis:    Result(s)   Card 1: Negative:     Card 2: Negative:   Card 3:Negative:    Completed by: Thomas Hoff , LPN   HEMOCCULT SENSA DEVELOPER: BZJ#:[id number] Governor Specking DATE: 05/50   HEMOCCULT SENSA CARD:  LFY#:[id number]   WCHENIDPOE DATE: 2020-05   CARD CONTROL RESULTS:  POSITIVE: Postive  NEGATIVE: Neagative    ADDITIONAL COMMENTS:  Forwarded to Deberah Castle , NP. Patient has not been called.

## 2016-07-20 NOTE — Telephone Encounter (Signed)
error 

## 2016-07-21 ENCOUNTER — Telehealth (INDEPENDENT_AMBULATORY_CARE_PROVIDER_SITE_OTHER): Payer: Self-pay | Admitting: Internal Medicine

## 2016-07-21 NOTE — Telephone Encounter (Signed)
Patient was called. Answering machine came on - you hade to enter a access code to leave a message. Susan Castle, NP had tried to call the patient , as documented, there was no answer. The patient is to have lab work on 08/04/2016. A letter was sent to her as a reminder.

## 2016-07-21 NOTE — Telephone Encounter (Signed)
Patient called, is wanting stool sample results.  [phone]

## 2016-07-25 NOTE — Telephone Encounter (Signed)
Results given to patient. She will go to lab and have blood work drawn.

## 2016-08-04 DIAGNOSIS — D508 Other iron deficiency anemias: Secondary | ICD-10-CM | POA: Diagnosis not present

## 2016-08-04 LAB — CBC
HEMATOCRIT: 38.1 % (ref 35.0–45.0)
Hemoglobin: 12 g/dL (ref 11.7–15.5)
MCH: 25.4 pg — AB (ref 27.0–33.0)
MCHC: 31.5 g/dL — AB (ref 32.0–36.0)
MCV: 80.7 fL (ref 80.0–100.0)
MPV: 9.2 fL (ref 7.5–12.5)
Platelets: 288 10*3/uL (ref 140–400)
RBC: 4.72 MIL/uL (ref 3.80–5.10)
RDW: 16.5 % — AB (ref 11.0–15.0)
WBC: 5.3 10*3/uL (ref 3.8–10.8)

## 2016-08-04 LAB — FERRITIN: Ferritin: 6 ng/mL — ABNORMAL LOW (ref 20–288)

## 2016-08-10 ENCOUNTER — Telehealth (INDEPENDENT_AMBULATORY_CARE_PROVIDER_SITE_OTHER): Payer: Self-pay | Admitting: Internal Medicine

## 2016-08-10 ENCOUNTER — Encounter (INDEPENDENT_AMBULATORY_CARE_PROVIDER_SITE_OTHER): Payer: Self-pay | Admitting: *Deleted

## 2016-08-10 ENCOUNTER — Other Ambulatory Visit (INDEPENDENT_AMBULATORY_CARE_PROVIDER_SITE_OTHER): Payer: Self-pay | Admitting: Internal Medicine

## 2016-08-10 DIAGNOSIS — D508 Other iron deficiency anemias: Secondary | ICD-10-CM

## 2016-08-10 DIAGNOSIS — D649 Anemia, unspecified: Secondary | ICD-10-CM | POA: Insufficient documentation

## 2016-08-10 NOTE — Telephone Encounter (Signed)
Addressed.

## 2016-08-10 NOTE — Telephone Encounter (Signed)
Patient called, returning your call.

## 2016-08-10 NOTE — Telephone Encounter (Signed)
I have spoken with patient 

## 2016-08-10 NOTE — Telephone Encounter (Signed)
Please call patient -- she has questions about EGD sch'd 10/27/16, patient aware, instructions mailed

## 2016-08-10 NOTE — Telephone Encounter (Signed)
EGD with Biopsy

## 2016-08-29 DIAGNOSIS — H353222 Exudative age-related macular degeneration, left eye, with inactive choroidal neovascularization: Secondary | ICD-10-CM | POA: Diagnosis not present

## 2016-08-29 DIAGNOSIS — H353211 Exudative age-related macular degeneration, right eye, with active choroidal neovascularization: Secondary | ICD-10-CM | POA: Diagnosis not present

## 2016-10-10 ENCOUNTER — Ambulatory Visit: Payer: Medicare Other | Admitting: Family Medicine

## 2016-10-17 DIAGNOSIS — H3570 Unspecified separation of retinal layers: Secondary | ICD-10-CM | POA: Diagnosis not present

## 2016-10-17 DIAGNOSIS — H35051 Retinal neovascularization, unspecified, right eye: Secondary | ICD-10-CM | POA: Diagnosis not present

## 2016-10-17 DIAGNOSIS — H353222 Exudative age-related macular degeneration, left eye, with inactive choroidal neovascularization: Secondary | ICD-10-CM | POA: Diagnosis not present

## 2016-10-17 DIAGNOSIS — H353211 Exudative age-related macular degeneration, right eye, with active choroidal neovascularization: Secondary | ICD-10-CM | POA: Diagnosis not present

## 2016-10-17 DIAGNOSIS — H35052 Retinal neovascularization, unspecified, left eye: Secondary | ICD-10-CM | POA: Diagnosis not present

## 2016-10-20 ENCOUNTER — Encounter: Payer: Self-pay | Admitting: Family Medicine

## 2016-10-20 ENCOUNTER — Ambulatory Visit (INDEPENDENT_AMBULATORY_CARE_PROVIDER_SITE_OTHER): Payer: Medicare Other | Admitting: Family Medicine

## 2016-10-20 VITALS — BP 146/86 | Ht 64.0 in | Wt 176.2 lb

## 2016-10-20 DIAGNOSIS — R5383 Other fatigue: Secondary | ICD-10-CM | POA: Diagnosis not present

## 2016-10-20 DIAGNOSIS — E038 Other specified hypothyroidism: Secondary | ICD-10-CM

## 2016-10-20 DIAGNOSIS — E784 Other hyperlipidemia: Secondary | ICD-10-CM | POA: Diagnosis not present

## 2016-10-20 DIAGNOSIS — R413 Other amnesia: Secondary | ICD-10-CM | POA: Diagnosis not present

## 2016-10-20 DIAGNOSIS — E7849 Other hyperlipidemia: Secondary | ICD-10-CM

## 2016-10-20 MED ORDER — LEVOTHYROXINE SODIUM 100 MCG PO TABS: 75.0000 ug | ORAL_TABLET | Freq: Every day | ORAL | 5 refills | Status: DC

## 2016-10-20 NOTE — Patient Instructions (Signed)

## 2016-10-20 NOTE — Progress Notes (Signed)
   Subjective:    Patient ID: Susan Mcneil, female    DOB: 1946/02/23, 71 y.o.   MRN: [id number]  Hypertension  This is a chronic problem. The current episode started more than 1 year ago. Pertinent negatives include no chest pain.  Patient takes her medicine regular basis She watches her diet She has not been taking her cholesterol recently She does take her thyroid medicine states energy level overall doing fairly well Her husband states she does have some bouts of forgetfulness but not worse then previous Patient does take care of the majority of her own needs. According the husband she does not have much interest in hobbies the patient denies being depressed Patient has concerns of lower back pain.   Review of Systems  Constitutional: Negative for activity change, appetite change and fatigue.  HENT: Negative for congestion.   Respiratory: Negative for cough.   Cardiovascular: Negative for chest pain.  Gastrointestinal: Negative for abdominal pain.  Endocrine: Negative for polydipsia and polyphagia.  Neurological: Negative for weakness.  Psychiatric/Behavioral: Negative for confusion.       Objective:   Physical Exam  Constitutional: She appears well-nourished. No distress.  Cardiovascular: Normal rate, regular rhythm and normal heart sounds.   No murmur heard. Pulmonary/Chest: Effort normal and breath sounds normal. No respiratory distress.  Musculoskeletal: She exhibits no edema.  Lymphadenopathy:    She has no cervical adenopathy.  Neurological: She is alert. She exhibits normal muscle tone.  Psychiatric: Her behavior is normal.  Vitals reviewed.   The patient can draw a clock appropriately she can repeat 3 items immediately and recall 1 out of 3 after words      Assessment & Plan:  Hypothyroidism continue current medications check lab work  Hyperlipidemia continue current medication although patient states she stopped taking the Crestor we will see where her  numbers are at previous labs reviewed  Cognitive dysfunction memory dysfunction check B12 level patient is not at the level of Alzheimer's will monitor closely  Mild fatigue  Lumbar pain-patient feels it's due to her mattress she denies radiation down the legs does not wake her up at night no weight loss or sweats with it

## 2016-10-22 LAB — HEPATIC FUNCTION PANEL
ALBUMIN: 4.9 g/dL — AB (ref 3.5–4.8)
ALT: 22 IU/L (ref 0–32)
AST: 33 IU/L (ref 0–40)
Alkaline Phosphatase: 77 IU/L (ref 39–117)
BILIRUBIN TOTAL: 0.3 mg/dL (ref 0.0–1.2)
Bilirubin, Direct: 0.08 mg/dL (ref 0.00–0.40)
TOTAL PROTEIN: 7.6 g/dL (ref 6.0–8.5)

## 2016-10-22 LAB — LIPID PANEL
Chol/HDL Ratio: 3.2 ratio (ref 0.0–4.4)
Cholesterol, Total: 266 mg/dL — ABNORMAL HIGH (ref 100–199)
HDL: 82 mg/dL (ref >39)
LDL CALC: 164 mg/dL — AB (ref 0–99)
Triglycerides: 98 mg/dL (ref 0–149)
VLDL Cholesterol Cal: 20 mg/dL (ref 5–40)

## 2016-10-22 LAB — TSH: TSH: 48.75 u[IU]/mL — ABNORMAL HIGH (ref 0.450–4.500)

## 2016-10-22 LAB — VITAMIN B12: Vitamin B-12: <150 pg/mL — ABNORMAL LOW (ref 232–1245)

## 2016-10-24 ENCOUNTER — Other Ambulatory Visit: Payer: Self-pay | Admitting: *Deleted

## 2016-10-24 DIAGNOSIS — E039 Hypothyroidism, unspecified: Secondary | ICD-10-CM

## 2016-10-24 MED ORDER — LEVOTHYROXINE SODIUM 150 MCG PO TABS: 150.0000 ug | ORAL_TABLET | Freq: Every day | ORAL | 5 refills | Status: DC

## 2016-10-26 ENCOUNTER — Ambulatory Visit (INDEPENDENT_AMBULATORY_CARE_PROVIDER_SITE_OTHER): Payer: Medicare Other | Admitting: *Deleted

## 2016-10-26 DIAGNOSIS — E538 Deficiency of other specified B group vitamins: Secondary | ICD-10-CM | POA: Diagnosis not present

## 2016-10-26 MED ORDER — CYANOCOBALAMIN 1000 MCG/ML IJ SOLN
1000.0000 ug | Freq: Once | INTRAMUSCULAR | Status: AC
  Administered 2016-10-26: 1000 ug via INTRAMUSCULAR

## 2016-10-27 ENCOUNTER — Ambulatory Visit (HOSPITAL_COMMUNITY)
Admission: RE | Admit: 2016-10-27 | Discharge: 2016-10-27 | Disposition: A | Discharge status: Home / Self Care | Payer: Medicare Other | Source: Ambulatory Visit | Attending: Internal Medicine | Admitting: Internal Medicine

## 2016-10-27 ENCOUNTER — Encounter (HOSPITAL_COMMUNITY)
Admission: RE | Disposition: A | Discharge status: Home / Self Care | Payer: Self-pay | Source: Ambulatory Visit | Attending: Internal Medicine

## 2016-10-27 ENCOUNTER — Encounter (HOSPITAL_COMMUNITY): Payer: Self-pay

## 2016-10-27 DIAGNOSIS — K760 Fatty (change of) liver, not elsewhere classified: Secondary | ICD-10-CM | POA: Diagnosis not present

## 2016-10-27 DIAGNOSIS — H3530 Unspecified macular degeneration: Secondary | ICD-10-CM | POA: Diagnosis not present

## 2016-10-27 DIAGNOSIS — Z90710 Acquired absence of both cervix and uterus: Secondary | ICD-10-CM | POA: Insufficient documentation

## 2016-10-27 DIAGNOSIS — I10 Essential (primary) hypertension: Secondary | ICD-10-CM | POA: Diagnosis not present

## 2016-10-27 DIAGNOSIS — Z823 Family history of stroke: Secondary | ICD-10-CM | POA: Diagnosis not present

## 2016-10-27 DIAGNOSIS — K219 Gastro-esophageal reflux disease without esophagitis: Secondary | ICD-10-CM | POA: Insufficient documentation

## 2016-10-27 DIAGNOSIS — E039 Hypothyroidism, unspecified: Secondary | ICD-10-CM | POA: Diagnosis not present

## 2016-10-27 DIAGNOSIS — M8580 Other specified disorders of bone density and structure, unspecified site: Secondary | ICD-10-CM | POA: Insufficient documentation

## 2016-10-27 DIAGNOSIS — Z809 Family history of malignant neoplasm, unspecified: Secondary | ICD-10-CM | POA: Insufficient documentation

## 2016-10-27 DIAGNOSIS — D508 Other iron deficiency anemias: Secondary | ICD-10-CM

## 2016-10-27 DIAGNOSIS — Z79899 Other long term (current) drug therapy: Secondary | ICD-10-CM | POA: Diagnosis not present

## 2016-10-27 DIAGNOSIS — R413 Other amnesia: Secondary | ICD-10-CM | POA: Insufficient documentation

## 2016-10-27 DIAGNOSIS — K449 Diaphragmatic hernia without obstruction or gangrene: Secondary | ICD-10-CM | POA: Diagnosis not present

## 2016-10-27 DIAGNOSIS — Z87442 Personal history of urinary calculi: Secondary | ICD-10-CM | POA: Insufficient documentation

## 2016-10-27 DIAGNOSIS — K2270 Barrett's esophagus without dysplasia: Secondary | ICD-10-CM | POA: Insufficient documentation

## 2016-10-27 DIAGNOSIS — Z87891 Personal history of nicotine dependence: Secondary | ICD-10-CM | POA: Insufficient documentation

## 2016-10-27 DIAGNOSIS — D509 Iron deficiency anemia, unspecified: Secondary | ICD-10-CM | POA: Insufficient documentation

## 2016-10-27 DIAGNOSIS — M810 Age-related osteoporosis without current pathological fracture: Secondary | ICD-10-CM | POA: Diagnosis not present

## 2016-10-27 DIAGNOSIS — D649 Anemia, unspecified: Secondary | ICD-10-CM

## 2016-10-27 HISTORY — PX: ESOPHAGOGASTRODUODENOSCOPY: SHX5428

## 2016-10-27 HISTORY — PX: BIOPSY: SHX5522

## 2016-10-27 SURGERY — EGD (ESOPHAGOGASTRODUODENOSCOPY)
Anesthesia: Moderate Sedation

## 2016-10-27 MED ORDER — MIDAZOLAM HCL 5 MG/5ML IJ SOLN
INTRAMUSCULAR | Status: DC | PRN
  Administered 2016-10-27: 2 mg via INTRAVENOUS
  Administered 2016-10-27: 1 mg via INTRAVENOUS

## 2016-10-27 MED ORDER — LIDOCAINE VISCOUS 2 % MT SOLN
OROMUCOSAL | Status: AC
  Filled 2016-10-27: qty 15

## 2016-10-27 MED ORDER — MEPERIDINE HCL 50 MG/ML IJ SOLN
INTRAMUSCULAR | Status: DC | PRN
  Administered 2016-10-27 (×2): 25 mg via INTRAVENOUS

## 2016-10-27 MED ORDER — MEPERIDINE HCL 50 MG/ML IJ SOLN
INTRAMUSCULAR | Status: AC
  Filled 2016-10-27: qty 1

## 2016-10-27 MED ORDER — STERILE WATER FOR IRRIGATION IR SOLN
Status: DC | PRN
  Administered 2016-10-27 (×2)

## 2016-10-27 MED ORDER — SODIUM CHLORIDE 0.9 % IV SOLN
INTRAVENOUS | Status: DC
  Administered 2016-10-27: 13:00:00 via INTRAVENOUS

## 2016-10-27 MED ORDER — MIDAZOLAM HCL 5 MG/5ML IJ SOLN
INTRAMUSCULAR | Status: AC
  Filled 2016-10-27: qty 10

## 2016-10-27 MED ORDER — LIDOCAINE VISCOUS 2 % MT SOLN
OROMUCOSAL | Status: DC | PRN
  Administered 2016-10-27: 4 mL via OROMUCOSAL

## 2016-10-27 NOTE — Op Note (Signed)
[facility] Patient Name: Susan Mcneil Procedure Date: 10/27/2016 12:24 PM MRN: [id number] Date of Birth: 1946/01/27 Attending MD: [name] , MD CSN: [id number] Age: 71 Admit Type: Outpatient Procedure:                Upper GI endoscopy Indications:              Unexplained iron deficiency anemia Providers:                [name], MD, Otis Peak B. Sharon Seller, RN, Aram Candela Referring MD:             Elayne Snare. Wolfgang Phoenix, MD Medicines:                Lidocaine spray, Meperidine 50 mg IV, Midazolam 3                            mg IV Complications:            No immediate complications. Estimated Blood Loss:     Estimated blood loss was minimal. Procedure:                Pre-Anesthesia Assessment:                           - Prior to the procedure, a History and Physical                            was performed, and patient medications and                            allergies were reviewed. The patient's tolerance of                            previous anesthesia was also reviewed. The risks                            and benefits of the procedure and the sedation                            options and risks were discussed with the patient.                            All questions were answered, and informed consent                            was obtained. Prior Anticoagulants: The patient has                            taken no previous anticoagulant or antiplatelet                            agents. ASA Grade Assessment: II - A patient with  mild systemic disease. After reviewing the risks                            and benefits, the patient was deemed in                            satisfactory condition to undergo the procedure.                           After obtaining informed consent, the endoscope was                            passed under direct vision. Throughout the                            procedure, the patient's  blood pressure, pulse, and                            oxygen saturations were monitored continuously. The                            SE-[id number] ([id number]) scope was introduced through the                            mouth, and advanced to the second part of duodenum.                            The upper GI endoscopy was accomplished without                            difficulty. The patient tolerated the procedure                            well. Scope In: 1:32:48 PM Scope Out: 1:40:13 PM Total Procedure Duration: 0 hours 7 minutes 25 seconds  Findings:      The examined esophagus was normal.      The Z-line was irregular and was found 34 cm from the incisors. Biopsies       were taken with a cold forceps for histology.      A 2 cm hiatal hernia was present.      The entire examined stomach was normal.      The duodenal bulb and second portion of the duodenum were normal. Impression:               - Normal esophagus.                           - Z-line irregular, 34 cm from the incisors.                            Biopsied.                           - 2 cm hiatal hernia.                           -  Normal stomach.                           - Normal duodenal bulb and second portion of the                            duodenum.                           comment: iron deficiency anemia has corrected with                            by mouth iron and is possibly due to blood donation.                           Patient has no GI symptoms. Therefore no further                            workup.                           Biopsy from GE junction shows short segment                            Barrett's esophaguswill need to be treated with PPI                            or H2B. Moderate Sedation:      Moderate (conscious) sedation was administered by the endoscopy nurse       and supervised by the endoscopist. The following parameters were       monitored: oxygen saturation, heart rate, blood  pressure, CO2       capnography and response to care. Total physician intraservice time was       12 minutes. Recommendation:           - Patient has a contact number available for                            emergencies. The signs and symptoms of potential                            delayed complications were discussed with the                            patient. Return to normal activities tomorrow.                            Written discharge instructions were provided to the                            patient.                           - Resume previous diet today.                           -  Continue present medications.                           - Await pathology results. Procedure Code(s):        --- Professional ---                           [phone], Esophagogastroduodenoscopy, flexible,                            transoral; with biopsy, single or multiple                           99152, Moderate sedation services provided by the                            same physician or other qualified health care                            professional performing the diagnostic or                            therapeutic service that the sedation supports,                            requiring the presence of an independent trained                            observer to assist in the monitoring of the                            patient's level of consciousness and physiological                            status; initial 15 minutes of intraservice time,                            patient age 7 years or older Diagnosis Code(s):        --- Professional ---                           K22.8, Other specified diseases of esophagus                           K44.9, Diaphragmatic hernia without obstruction or                            gangrene                           D50.9, Iron deficiency anemia, unspecified CPT copyright 2016 [employer]. All rights reserved. The codes documented in  this report are preliminary and upon coder review may  be revised to meet current compliance requirements. [name], MD [name], MD 10/27/2016 1:49:58 PM This report has been signed electronically. Number of Addenda: 0

## 2016-10-27 NOTE — Discharge Instructions (Signed)
Resume usual medications and diet. No driving for 24 hours. Physician will call with biopsy results.     Esophagogastroduodenoscopy, Care After Refer to this sheet in the next few weeks. These instructions provide you with information about caring for yourself after your procedure. Your health care provider may also give you more specific instructions. Your treatment has been planned according to current medical practices, but problems sometimes occur. Call your health care provider if you have any problems or questions after your procedure. What can I expect after the procedure? After the procedure, it is common to have:  A sore throat.  Nausea.  Bloating.  Dizziness.  Fatigue.  Follow these instructions at home:  Do not eat or drink anything until the numbing medicine (local anesthetic) has worn off and your gag reflex has returned. You will know that the local anesthetic has worn off when you can swallow comfortably.  Do not drive for 24 hours if you received a medicine to help you relax (sedative).  If your health care provider took a tissue sample for testing during the procedure, make sure to get your test results. This is your responsibility. Ask your health care provider or the department performing the test when your results will be ready.  Keep all follow-up visits as told by your health care provider. This is important. Contact a health care provider if:  You cannot stop coughing.  You are not urinating.  You are urinating less than usual. Get help right away if:  You have trouble swallowing.  You cannot eat or drink.  You have throat or chest pain that gets worse.  You are dizzy or light-headed.  You faint.  You have nausea or vomiting.  You have chills.  You have a fever.  You have severe abdominal pain.  You have black, tarry, or bloody stools. This information is not intended to replace advice given to you by your health care provider. Make  sure you discuss any questions you have with your health care provider. Document Released: 04/11/2012 Document Revised: 10/01/2015 Document Reviewed: 03/19/2015 Elsevier Interactive Patient Education  2018 [employer].    Hiatal Hernia A hiatal hernia occurs when part of the stomach slides above the muscle that separates the abdomen from the chest (diaphragm). A person can be born with a hiatal hernia (congenital), or it may develop over time. In almost all cases of hiatal hernia, only the top part of the stomach pushes through the diaphragm. Many people have a hiatal hernia with no symptoms. The larger the hernia, the more likely it is that you will have symptoms. In some cases, a hiatal hernia allows stomach acid to flow back into the tube that carries food from your mouth to your stomach (esophagus). This may cause heartburn symptoms. Severe heartburn symptoms may mean that you have developed a condition called gastroesophageal reflux disease (GERD). What are the causes? This condition is caused by a weakness in the opening (hiatus) where the esophagus passes through the diaphragm to attach to the upper part of the stomach. A person may be born with a weakness in the hiatus, or a weakness can develop over time. What increases the risk? This condition is more likely to develop in:  Older people. Age is a major risk factor for a hiatal hernia, especially if you are over the age of 85.  Pregnant women.  People who are overweight.  People who have frequent constipation.  What are the signs or symptoms? Symptoms of this  condition usually develop in the form of GERD symptoms. Symptoms include:  Heartburn.  Belching.  Indigestion.  Trouble swallowing.  Coughing or wheezing.  Sore throat.  Hoarseness.  Chest pain.  Nausea and vomiting.  How is this diagnosed? This condition may be diagnosed during testing for GERD. Tests that may be done include:  X-rays of your stomach or  chest.  An upper gastrointestinal (GI) series. This is an X-ray exam of your GI tract that is taken after you swallow a chalky liquid that shows up clearly on the X-ray.  Endoscopy. This is a procedure to look into your stomach using a thin, flexible tube that has a tiny camera and light on the end of it.  How is this treated? This condition may be treated by:  Dietary and lifestyle changes to help reduce GERD symptoms.  Medicines. These may include: ? Over-the-counter antacids. ? Medicines that make your stomach empty more quickly. ? Medicines that block the production of stomach acid (H2 blockers). ? Stronger medicines to reduce stomach acid (proton pump inhibitors).  Surgery to repair the hernia, if other treatments are not helping.  If you have no symptoms, you may not need treatment. Follow these instructions at home: Lifestyle and activity  Do not use any products that contain nicotine or tobacco, such as cigarettes and e-cigarettes. If you need help quitting, ask your health care provider.  Try to achieve and maintain a healthy body weight.  Avoid putting pressure on your abdomen. Anything that puts pressure on your abdomen increases the amount of acid that may be pushed up into your esophagus. ? Avoid bending over, especially after eating. ? Raise the head of your bed by putting blocks under the legs. This keeps your head and esophagus higher than your stomach. ? Do not wear tight clothing around your chest or stomach. ? Try not to strain when having a bowel movement, when urinating, or when lifting heavy objects. Eating and drinking  Avoid foods that can worsen GERD symptoms. These may include: ? Fatty foods, like fried foods. ? Citrus fruits, like oranges or lemon. ? Other foods and drinks that contain acid, like orange juice or tomatoes. ? Spicy food. ? Chocolate.  Eat frequent small meals instead of three large meals a day. This helps prevent your stomach from  getting too full. ? Eat slowly. ? Do not lie down right after eating. ? Do not eat 1-2 hours before bed.  Do not drink beverages with caffeine. These include cola, coffee, cocoa, and tea.  Do not drink alcohol. General instructions  Take over-the-counter and prescription medicines only as told by your health care provider.  Keep all follow-up visits as told by your health care provider. This is important. Contact a health care provider if:  Your symptoms are not controlled with medicines or lifestyle changes.  You are having trouble swallowing.  You have coughing or wheezing that will not go away. Get help right away if:  Your pain is getting worse.  Your pain spreads to your arms, neck, jaw, teeth, or back.  You have shortness of breath.  You sweat for no reason.  You feel sick to your stomach (nauseous) or you vomit.  You vomit blood.  You have bright red blood in your stools.  You have black, tarry stools. This information is not intended to replace advice given to you by your health care provider. Make sure you discuss any questions you have with your health care provider. Document Released:  07/16/2003 Document Revised: 04/18/2016 Document Reviewed: 04/18/2016 Elsevier Interactive Patient Education  [employer].

## 2016-10-27 NOTE — H&P (Signed)
Susan Mcneil is an 71 y.o. female.   Chief Complaint: Patient is here for EGD. HPI: Patient is 71 year old Caucasian female recently found to have iron deficiency anemia. Aleve occasionally. There is no history of heartburn and dysphagia nausea vomiting abdominal pain melena or rectal bleeding. She had multiple Hemoccults through my office and these are all negative. Her hemoglobin has corrected with by mouth iron. Patient had colonoscopy in November 2016. Therefore EGD was recommended as a next step in her workup. Patient has been donating blood. She has not been diagnosed with iron deficiency previously. Family history is negative for CRC.  Past Medical History:  Diagnosis Date  . Fatty liver   . HTN (hypertension)    cholesterol & thyroid  . Hypothyroidism   . Kidney stone   . Macular degeneration   . Macular degeneration, bilateral   . Memory disturbance   . Osteopenia   . Osteoporosis   . Reflux gastritis   . Thyroid disorder     Past Surgical History:  Procedure Laterality Date  . ABDOMINAL HYSTERECTOMY   patial, pre-uterine cancer, 1974  . COLONOSCOPY N/A 04/01/2015   Procedure: COLONOSCOPY;  Surgeon: Rogene Houston, MD;  Location: [facility];  Service: Endoscopy;  Laterality: N/A;  1030  . DIAGNOSTIC LAPAROSCOPY    . TOTAL ABDOMINAL HYSTERECTOMY W/ BILATERAL SALPINGOOPHORECTOMY      Family History  Problem Relation Age of Onset  . Cancer Mother   . Stroke Father    Social History:  reports that she quit smoking about 15 years ago. She has never used smokeless tobacco. She reports that she does not drink alcohol or use drugs.  Allergies: No Known Allergies  Medications Prior to Admission  Medication Sig Dispense Refill  . Calcium Citrate (CITRACAL PO) Take 1 tablet by mouth daily.     . CRESTOR 10 MG tablet TAKE 1 TABLET BY MOUTH EVERY NIGHT AT BEDTIME 30 tablet 0  . ferrous sulfate 325 (65 FE) MG tablet Take 325 mg by mouth daily.    Marland Kitchen levothyroxine  (SYNTHROID, LEVOTHROID) 150 MCG tablet Take 1 tablet (150 mcg total) by mouth daily. 30 tablet 5  . lisinopril (PRINIVIL,ZESTRIL) 10 MG tablet Take 10 mg by mouth daily.    . Multiple Vitamins-Minerals ([facility] MACULAR HEALTH PO) Take 1 tablet by mouth every other day.     . naproxen sodium (ANAPROX) 220 MG tablet Take 440 mg by mouth daily as needed (pain).    . Travoprost, BAK Free, (TRAVATAN) 0.004 % SOLN ophthalmic solution Place 1 drop into the left eye at bedtime.      No results found for this or any previous visit (from the past 48 hour(s)). No results found.  ROS  Blood pressure (!) 154/66, pulse 64, temperature 98.4 F (36.9 C), temperature source Oral, resp. rate 17, SpO2 98 %. Physical Exam  Constitutional: She appears well-developed and well-nourished.  HENT:  Mouth/Throat: Oropharynx is clear and moist.  Eyes: Conjunctivae are normal. No scleral icterus.  Neck: No thyromegaly present.  Cardiovascular: Normal rate, regular rhythm and normal heart sounds.   No murmur heard. Respiratory: Effort normal and breath sounds normal.  GI: Soft. She exhibits no distension and no mass. There is no tenderness.  Musculoskeletal: She exhibits no edema.  Lymphadenopathy:    She has no cervical adenopathy.  Neurological: She is alert.  Skin: Skin is warm and dry.     Assessment/Plan Iron deficiency anemia. Patient had colonoscopy November 2016 because of  history of polyps. Diagnostic EGD.  [name], MD 10/27/2016, 1:23 PM

## 2016-10-31 ENCOUNTER — Encounter: Payer: Self-pay | Admitting: Family Medicine

## 2016-10-31 DIAGNOSIS — K2270 Barrett's esophagus without dysplasia: Secondary | ICD-10-CM | POA: Insufficient documentation

## 2016-11-02 ENCOUNTER — Ambulatory Visit (INDEPENDENT_AMBULATORY_CARE_PROVIDER_SITE_OTHER): Payer: Medicare Other | Admitting: *Deleted

## 2016-11-02 DIAGNOSIS — E538 Deficiency of other specified B group vitamins: Secondary | ICD-10-CM

## 2016-11-02 MED ORDER — CYANOCOBALAMIN 1000 MCG/ML IJ SOLN
1000.0000 ug | Freq: Once | INTRAMUSCULAR | Status: AC
  Administered 2016-11-02: 1000 ug via INTRAMUSCULAR

## 2016-11-03 DIAGNOSIS — H353130 Nonexudative age-related macular degeneration, bilateral, stage unspecified: Secondary | ICD-10-CM | POA: Diagnosis not present

## 2016-11-03 DIAGNOSIS — H401133 Primary open-angle glaucoma, bilateral, severe stage: Secondary | ICD-10-CM | POA: Diagnosis not present

## 2016-11-03 DIAGNOSIS — H2513 Age-related nuclear cataract, bilateral: Secondary | ICD-10-CM | POA: Diagnosis not present

## 2016-11-07 ENCOUNTER — Encounter (HOSPITAL_COMMUNITY): Payer: Self-pay | Admitting: Internal Medicine

## 2016-11-08 ENCOUNTER — Ambulatory Visit (INDEPENDENT_AMBULATORY_CARE_PROVIDER_SITE_OTHER): Payer: Medicare Other | Admitting: *Deleted

## 2016-11-08 DIAGNOSIS — D510 Vitamin B12 deficiency anemia due to intrinsic factor deficiency: Secondary | ICD-10-CM | POA: Diagnosis not present

## 2016-11-08 MED ORDER — CYANOCOBALAMIN 1000 MCG/ML IJ SOLN
1000.0000 ug | Freq: Once | INTRAMUSCULAR | Status: AC
  Administered 2016-11-08: 1000 ug via INTRAMUSCULAR

## 2016-11-15 ENCOUNTER — Ambulatory Visit: Payer: Medicare Other

## 2016-11-16 ENCOUNTER — Ambulatory Visit (INDEPENDENT_AMBULATORY_CARE_PROVIDER_SITE_OTHER): Payer: Medicare Other | Admitting: *Deleted

## 2016-11-16 ENCOUNTER — Other Ambulatory Visit (INDEPENDENT_AMBULATORY_CARE_PROVIDER_SITE_OTHER): Payer: Self-pay | Admitting: Internal Medicine

## 2016-11-16 DIAGNOSIS — E538 Deficiency of other specified B group vitamins: Secondary | ICD-10-CM | POA: Diagnosis not present

## 2016-11-16 MED ORDER — FAMOTIDINE 40 MG PO TABS: 40.0000 mg | ORAL_TABLET | Freq: Every day | ORAL | 3 refills | Status: DC

## 2016-11-16 MED ORDER — CYANOCOBALAMIN 1000 MCG/ML IJ SOLN
1000.0000 ug | Freq: Once | INTRAMUSCULAR | Status: AC
  Administered 2016-11-16: 1000 ug via INTRAMUSCULAR

## 2016-11-24 ENCOUNTER — Ambulatory Visit (INDEPENDENT_AMBULATORY_CARE_PROVIDER_SITE_OTHER): Payer: Medicare Other | Admitting: *Deleted

## 2016-11-24 DIAGNOSIS — D510 Vitamin B12 deficiency anemia due to intrinsic factor deficiency: Secondary | ICD-10-CM | POA: Diagnosis not present

## 2016-11-24 MED ORDER — CYANOCOBALAMIN 1000 MCG/ML IJ SOLN
1000.0000 ug | Freq: Once | INTRAMUSCULAR | Status: AC
  Administered 2016-11-24: 1000 ug via INTRAMUSCULAR

## 2016-12-16 DIAGNOSIS — H2513 Age-related nuclear cataract, bilateral: Secondary | ICD-10-CM | POA: Diagnosis not present

## 2016-12-16 DIAGNOSIS — H2511 Age-related nuclear cataract, right eye: Secondary | ICD-10-CM | POA: Diagnosis not present

## 2016-12-27 ENCOUNTER — Ambulatory Visit: Payer: Medicare Other

## 2016-12-28 ENCOUNTER — Ambulatory Visit (INDEPENDENT_AMBULATORY_CARE_PROVIDER_SITE_OTHER): Payer: Medicare Other

## 2016-12-28 DIAGNOSIS — D518 Other vitamin B12 deficiency anemias: Secondary | ICD-10-CM | POA: Diagnosis not present

## 2016-12-28 MED ORDER — CYANOCOBALAMIN 1000 MCG/ML IJ SOLN
1000.0000 ug | Freq: Once | INTRAMUSCULAR | Status: AC
  Administered 2016-12-28: 1000 ug via INTRAMUSCULAR

## 2017-01-02 DIAGNOSIS — E039 Hypothyroidism, unspecified: Secondary | ICD-10-CM | POA: Diagnosis not present

## 2017-01-03 ENCOUNTER — Encounter: Payer: Self-pay | Admitting: Family Medicine

## 2017-01-03 LAB — TSH: TSH: 0.488 u[IU]/mL (ref 0.450–4.500)

## 2017-01-09 ENCOUNTER — Other Ambulatory Visit: Payer: Self-pay | Admitting: Family Medicine

## 2017-01-16 DIAGNOSIS — H353112 Nonexudative age-related macular degeneration, right eye, intermediate dry stage: Secondary | ICD-10-CM | POA: Diagnosis not present

## 2017-01-16 DIAGNOSIS — H353212 Exudative age-related macular degeneration, right eye, with inactive choroidal neovascularization: Secondary | ICD-10-CM | POA: Diagnosis not present

## 2017-01-16 DIAGNOSIS — H353222 Exudative age-related macular degeneration, left eye, with inactive choroidal neovascularization: Secondary | ICD-10-CM | POA: Diagnosis not present

## 2017-01-16 DIAGNOSIS — H353122 Nonexudative age-related macular degeneration, left eye, intermediate dry stage: Secondary | ICD-10-CM | POA: Diagnosis not present

## 2017-01-30 DIAGNOSIS — H2511 Age-related nuclear cataract, right eye: Secondary | ICD-10-CM | POA: Diagnosis not present

## 2017-01-31 DIAGNOSIS — H2512 Age-related nuclear cataract, left eye: Secondary | ICD-10-CM | POA: Diagnosis not present

## 2017-02-13 DIAGNOSIS — H353112 Nonexudative age-related macular degeneration, right eye, intermediate dry stage: Secondary | ICD-10-CM | POA: Diagnosis not present

## 2017-02-13 DIAGNOSIS — H353212 Exudative age-related macular degeneration, right eye, with inactive choroidal neovascularization: Secondary | ICD-10-CM | POA: Diagnosis not present

## 2017-02-13 DIAGNOSIS — H353122 Nonexudative age-related macular degeneration, left eye, intermediate dry stage: Secondary | ICD-10-CM | POA: Diagnosis not present

## 2017-02-13 DIAGNOSIS — H353222 Exudative age-related macular degeneration, left eye, with inactive choroidal neovascularization: Secondary | ICD-10-CM | POA: Diagnosis not present

## 2017-02-16 ENCOUNTER — Ambulatory Visit (INDEPENDENT_AMBULATORY_CARE_PROVIDER_SITE_OTHER): Payer: Medicare Other | Admitting: Family Medicine

## 2017-02-16 ENCOUNTER — Encounter: Payer: Self-pay | Admitting: Family Medicine

## 2017-02-16 VITALS — BP 134/88 | Ht 64.0 in | Wt 173.0 lb

## 2017-02-16 DIAGNOSIS — E7849 Other hyperlipidemia: Secondary | ICD-10-CM

## 2017-02-16 DIAGNOSIS — I10 Essential (primary) hypertension: Secondary | ICD-10-CM | POA: Diagnosis not present

## 2017-02-16 DIAGNOSIS — E038 Other specified hypothyroidism: Secondary | ICD-10-CM

## 2017-02-16 DIAGNOSIS — Z23 Encounter for immunization: Secondary | ICD-10-CM | POA: Diagnosis not present

## 2017-02-16 DIAGNOSIS — Z79899 Other long term (current) drug therapy: Secondary | ICD-10-CM | POA: Diagnosis not present

## 2017-02-16 MED ORDER — LEVOTHYROXINE SODIUM 150 MCG PO TABS: 150.0000 ug | ORAL_TABLET | Freq: Every day | ORAL | 5 refills | Status: DC

## 2017-02-16 MED ORDER — LISINOPRIL 10 MG PO TABS: 10.0000 mg | ORAL_TABLET | Freq: Every day | ORAL | 6 refills | Status: DC

## 2017-02-16 MED ORDER — ROSUVASTATIN CALCIUM 10 MG PO TABS: 10.0000 mg | ORAL_TABLET | Freq: Every day | ORAL | 6 refills | Status: DC

## 2017-02-16 NOTE — Progress Notes (Signed)
   Subjective:    Patient ID: Susan Mcneil, female    DOB: 26-Jul-1945, 71 y.o.   MRN: [id number]  Hyperlipidemia  This is a chronic problem. Pertinent negatives include no chest pain or shortness of breath. Treatments tried: crestor. Compliance problems include adherence to exercise.   Patient relates that she is taking her medication as directed. She does try to watch her diet. She's had cholesterol issues for months.  She does have some memory dysfunction she feels like it stable. She states she drives and has not been getting lost. Her husband often hangs out with her. Pt states no concerns today.   Flu vaccine today.    Review of Systems  Constitutional: Negative for activity change, fatigue and fever.  HENT: Negative for congestion.   Respiratory: Negative for cough, chest tightness and shortness of breath.   Cardiovascular: Negative for chest pain and leg swelling.  Gastrointestinal: Negative for abdominal pain.  Skin: Negative for color change.  Neurological: Negative for headaches.  Psychiatric/Behavioral: Negative for behavioral problems.       Objective:   Physical Exam  Constitutional: She appears well-developed and well-nourished. No distress.  HENT:  Head: Normocephalic and atraumatic.  Eyes: Right eye exhibits no discharge. Left eye exhibits no discharge.  Neck: No tracheal deviation present.  Cardiovascular: Normal rate, regular rhythm and normal heart sounds.   No murmur heard. Pulmonary/Chest: Effort normal and breath sounds normal. No respiratory distress. She has no wheezes. She has no rales.  Musculoskeletal: She exhibits no edema.  Lymphadenopathy:    She has no cervical adenopathy.  Neurological: She is alert. She exhibits normal muscle tone.  Skin: Skin is warm and dry. No erythema.  Psychiatric: Her behavior is normal.  Vitals reviewed.         Assessment & Plan:  Thyroid continue current medication recent lab work looked good  Blood  pressure good control check metabolic 7  Lipid profile recommended continue Crestor for cholesterol control  Follow-up if ongoing troubles  Follow-up in spring comprehensive checkup at that time

## 2017-02-17 ENCOUNTER — Encounter: Payer: Self-pay | Admitting: Family Medicine

## 2017-02-17 DIAGNOSIS — E7849 Other hyperlipidemia: Secondary | ICD-10-CM | POA: Diagnosis not present

## 2017-02-17 DIAGNOSIS — I10 Essential (primary) hypertension: Secondary | ICD-10-CM | POA: Diagnosis not present

## 2017-02-17 DIAGNOSIS — Z23 Encounter for immunization: Secondary | ICD-10-CM | POA: Diagnosis not present

## 2017-02-17 DIAGNOSIS — Z79899 Other long term (current) drug therapy: Secondary | ICD-10-CM | POA: Diagnosis not present

## 2017-02-17 DIAGNOSIS — E038 Other specified hypothyroidism: Secondary | ICD-10-CM | POA: Diagnosis not present

## 2017-02-17 LAB — BASIC METABOLIC PANEL
BUN / CREAT RATIO: 16 (ref 12–28)
BUN: 13 mg/dL (ref 8–27)
CO2: 26 mmol/L (ref 20–29)
CREATININE: 0.8 mg/dL (ref 0.57–1.00)
Calcium: 9.3 mg/dL (ref 8.7–10.3)
Chloride: 107 mmol/L — ABNORMAL HIGH (ref 96–106)
GFR calc Af Amer: 85 mL/min/{1.73_m2} (ref >59)
GFR, EST NON AFRICAN AMERICAN: 74 mL/min/{1.73_m2} (ref >59)
Glucose: 82 mg/dL (ref 65–99)
POTASSIUM: 3.6 mmol/L (ref 3.5–5.2)
SODIUM: 147 mmol/L — AB (ref 134–144)

## 2017-02-17 LAB — HEPATIC FUNCTION PANEL
ALBUMIN: 4.5 g/dL (ref 3.5–4.8)
ALK PHOS: 83 IU/L (ref 39–117)
ALT: 17 IU/L (ref 0–32)
AST: 22 IU/L (ref 0–40)
BILIRUBIN, DIRECT: 0.1 mg/dL (ref 0.00–0.40)
Bilirubin Total: 0.3 mg/dL (ref 0.0–1.2)
TOTAL PROTEIN: 7.1 g/dL (ref 6.0–8.5)

## 2017-02-17 LAB — LIPID PANEL
CHOLESTEROL TOTAL: 174 mg/dL (ref 100–199)
Chol/HDL Ratio: 2.6 ratio (ref 0.0–4.4)
HDL: 68 mg/dL (ref >39)
LDL Calculated: 89 mg/dL (ref 0–99)
Triglycerides: 84 mg/dL (ref 0–149)
VLDL CHOLESTEROL CAL: 17 mg/dL (ref 5–40)

## 2017-02-20 ENCOUNTER — Ambulatory Visit: Payer: Medicare Other | Admitting: Family Medicine

## 2017-02-20 DIAGNOSIS — H2512 Age-related nuclear cataract, left eye: Secondary | ICD-10-CM | POA: Diagnosis not present

## 2017-02-20 DIAGNOSIS — H25812 Combined forms of age-related cataract, left eye: Secondary | ICD-10-CM | POA: Diagnosis not present

## 2017-03-13 ENCOUNTER — Encounter: Payer: Self-pay | Admitting: Family Medicine

## 2017-03-13 ENCOUNTER — Ambulatory Visit (INDEPENDENT_AMBULATORY_CARE_PROVIDER_SITE_OTHER): Payer: Medicare Other | Admitting: Family Medicine

## 2017-03-13 ENCOUNTER — Ambulatory Visit (HOSPITAL_COMMUNITY)
Admission: RE | Admit: 2017-03-13 | Discharge: 2017-03-13 | Disposition: A | Discharge status: Home / Self Care | Payer: Medicare Other | Source: Ambulatory Visit | Attending: Family Medicine | Admitting: Family Medicine

## 2017-03-13 ENCOUNTER — Encounter (HOSPITAL_COMMUNITY): Payer: Self-pay

## 2017-03-13 ENCOUNTER — Ambulatory Visit (HOSPITAL_COMMUNITY): Admission: RE | Admit: 2017-03-13 | Payer: Medicare Other | Source: Ambulatory Visit

## 2017-03-13 VITALS — BP 124/72 | Temp 98.3°F | Ht 64.0 in | Wt 173.0 lb

## 2017-03-13 DIAGNOSIS — M7502 Adhesive capsulitis of left shoulder: Secondary | ICD-10-CM | POA: Diagnosis not present

## 2017-03-13 DIAGNOSIS — I700 Atherosclerosis of aorta: Secondary | ICD-10-CM | POA: Diagnosis not present

## 2017-03-13 DIAGNOSIS — R222 Localized swelling, mass and lump, trunk: Secondary | ICD-10-CM | POA: Insufficient documentation

## 2017-03-13 DIAGNOSIS — R918 Other nonspecific abnormal finding of lung field: Secondary | ICD-10-CM | POA: Diagnosis not present

## 2017-03-13 NOTE — Patient Instructions (Signed)

## 2017-03-13 NOTE — Progress Notes (Signed)
   Subjective:    Patient ID: Susan Mcneil, female    DOB: 03-07-1946, 71 y.o.   MRN: [id number]  Arm Pain   Incident onset: several weeks ago. Pain location: left side of neck and left arm. Treatments tried: aleve and pain patches. The treatment provided mild relief.   She states shoulder discomfort the past several weeks of her time moving it has difficult time raising her arm pain is in the left shoulder region does not radiate down the arm.  Does go into the deltoid.  Denies numbness but does relate some weakness related to the pain  She also feels there is a fullness in the supraclavicular area on the left side she has not noticed this before but she denies any cough sweats chills shortness of breath.   Review of Systems No headaches fever chills sweats no shortness of breath no chest pressure tightness pain does relate left shoulder pain no abdominal pain    Objective:   Physical Exam Circumference of both arms within 1/4 inch of each other no tenderness in either arm decreased range of motion of the left shoulder some fullness in the left supraclavicular area but not any sign of tumor or growth on exam lungs are clear heart regular  Patient does not appear toxic.    Assessment & Plan:  Supraclavicular fullness on the left side I doubt that this is a tumor we will do an x-ray to look at the upper part of the lung await the results of this  Patient has left shoulder adhesion issues referral to Dr. Aline Brochure anti-inflammatory exercises shown to follow-up if ongoing troubles

## 2017-03-14 ENCOUNTER — Encounter: Payer: Self-pay | Admitting: Family Medicine

## 2017-03-15 ENCOUNTER — Encounter: Payer: Self-pay | Admitting: Family Medicine

## 2017-03-15 DIAGNOSIS — I700 Atherosclerosis of aorta: Secondary | ICD-10-CM | POA: Insufficient documentation

## 2017-03-24 ENCOUNTER — Ambulatory Visit (INDEPENDENT_AMBULATORY_CARE_PROVIDER_SITE_OTHER): Payer: Medicare Other | Admitting: Orthopedic Surgery

## 2017-03-24 ENCOUNTER — Encounter: Payer: Self-pay | Admitting: Orthopedic Surgery

## 2017-03-24 ENCOUNTER — Ambulatory Visit (INDEPENDENT_AMBULATORY_CARE_PROVIDER_SITE_OTHER): Payer: Medicare Other

## 2017-03-24 VITALS — BP 176/86 | HR 68 | Ht 64.0 in | Wt 175.0 lb

## 2017-03-24 DIAGNOSIS — M25512 Pain in left shoulder: Secondary | ICD-10-CM

## 2017-03-24 DIAGNOSIS — G8929 Other chronic pain: Secondary | ICD-10-CM

## 2017-03-24 DIAGNOSIS — M75102 Unspecified rotator cuff tear or rupture of left shoulder, not specified as traumatic: Secondary | ICD-10-CM | POA: Diagnosis not present

## 2017-03-24 DIAGNOSIS — M75112 Incomplete rotator cuff tear or rupture of left shoulder, not specified as traumatic: Secondary | ICD-10-CM

## 2017-03-24 NOTE — Patient Instructions (Addendum)
Rotator Cuff TEAR   Rotator cuff injury is any type of injury to the set of muscles and tendons that make up the stabilizing unit of your shoulder. This unit holds the ball of your upper arm bone (humerus) in the socket of your shoulder blade (scapula). What are the causes? Injuries to your rotator cuff most commonly come from sports or activities that cause your arm to be moved repeatedly over your head. Examples of this include throwing, weight lifting, swimming, or racquet sports. Long lasting (chronic) irritation of your rotator cuff can cause soreness and swelling (inflammation), bursitis, and eventual damage to your tendons, such as a tear (rupture). What are the signs or symptoms? Acute rotator cuff tear:  Sudden tearing sensation followed by severe pain shooting from your upper shoulder down your arm toward your elbow.  Decreased range of motion of your shoulder because of pain and muscle spasm.  Severe pain.  Inability to raise your arm out to the side because of pain and loss of muscle power (large tears).  Chronic rotator cuff tear:  Pain that usually is worse at night and may interfere with sleep.  Gradual weakness and decreased shoulder motion as the pain worsens.  Decreased range of motion.  Rotator cuff tendinitis:  Deep ache in your shoulder and the outside upper arm over your shoulder.  Pain that comes on gradually and becomes worse when lifting your arm to the side or turning it inward.  How is this diagnosed? Rotator cuff injury is diagnosed through a medical history, physical exam, and imaging exam. The medical history helps determine the type of rotator cuff injury. Your health care provider will look at your injured shoulder, feel the injured area, and ask you to move your shoulder in different positions. X-ray exams typically are done to rule out other causes of shoulder pain, such as fractures. MRI is the exam of choice for the most severe shoulder injuries  because the images show muscles and tendons. How is this treated? Chronic tear:  Medicine for pain, such as acetaminophen or ibuprofen.  Physical therapy and range-of-motion exercises may be helpful in maintaining shoulder function and strength.  Steroid injections into your shoulder joint.  Surgical repair of the rotator cuff if the injury does not heal with noninvasive treatment.  Acute tear:  Anti-inflammatory medicines such as ibuprofen and naproxen to help reduce pain and swelling.  A sling to help support your arm and rest your rotator cuff muscles. Long-term use of a sling is not advised. It may cause significant stiffening of the shoulder joint.  Surgery may be considered within a few weeks, especially in younger, active people, to return the shoulder to full function.  Indications for surgical treatment include the following: ? Age younger than 24 years. ? Rotator cuff tears that are complete. ? Physical therapy, rest, and anti-inflammatory medicines have been used for 6-8 weeks, with no improvement. ? Employment or sporting activity that requires constant shoulder use.  Tendinitis:  Anti-inflammatory medicines such as ibuprofen and naproxen to help reduce pain and swelling.  A sling to help support your arm and rest your rotator cuff muscles. Long-term use of a sling is not advised. It may cause significant stiffening of the shoulder joint.  Severe tendinitis may require: ? Steroid injections into your shoulder joint. ? Physical therapy. ? Surgery.  You have received an injection of steroids into the joint. 15% of patients will have increased pain within the 24 hours postinjection.   This is  transient and will go away.   We recommend that you use ice packs on the injection site for 20 minutes every 2 hours and extra strength Tylenol 2 tablets every 8 as needed until the pain resolves.  If you continue to have pain after taking the Tylenol and using the ice please  call the office for further instructions.  [facility] 4 WEEKS; 2 X A WEEK Physical therapy has been ordered for you at [facility] [phone]  is the phone number to call if you want to call to schedule. Please let us know if you do not hear anything within one week.

## 2017-03-24 NOTE — Progress Notes (Signed)
Encounter Diagnoses  Name Primary?  . Chronic left shoulder pain   . Rotator cuff syndrome of left shoulder Yes  . Incomplete tear of left rotator cuff    Radiograph was ordered and interpreted: which showed acromioclavicular arthrosis greater tuberosity sclerosis  We gave her an injection in the subacromial space  Procedure note the subacromial injection shoulder left   Verbal consent was obtained to inject the  Left   Shoulder  Timeout was completed to confirm the injection site is a subacromial space of the  left  shoulder  Medication used Depo-Medrol 40 mg and lidocaine 1% 3 cc  Anesthesia was provided by ethyl chloride  The injection was performed in the left  posterior subacromial space. After pinning the skin with alcohol and anesthetized the skin with ethyl chloride the subacromial space was injected using a 20-gauge needle. There were no complications  Sterile dressing was applied.   Plan We recommended physical therapy [facility] for 4 weeks twice a week and then she will see [place] again in 6 weeks  History  71 year old female presents with several month history of left shoulder pain with no history of trauma.  She has pain at night she has pain when she reaches over her head and pain when she puts her arm back down to her side she notices that she has lost some of the motion in the shoulder.  Pain radiates to the elbow but not below.  She describes a dull aching moderate pain.  She denies any treatment of medicine therapy or injection   Review of Systems  Constitutional: Negative for chills and fever.  Musculoskeletal: Negative for neck pain.  Skin: Negative.   Neurological: Negative for tingling, tremors and sensory change.   Past Medical History:  Diagnosis Date  . Fatty liver   . HTN (hypertension)    cholesterol & thyroid  . Hypothyroidism   . Kidney stone   . Macular degeneration   . Macular degeneration, bilateral   . Memory disturbance   .  Osteopenia   . Osteoporosis   . Reflux gastritis   . Thyroid disorder     Past Surgical History:  Procedure Laterality Date  . ABDOMINAL HYSTERECTOMY   patial, pre-uterine cancer, 1974  . BIOPSY  10/27/2016   Performed by Rogene Houston, MD at [facility]  . COLONOSCOPY N/A 04/01/2015   Performed by Rogene Houston, MD at [facility]  . DIAGNOSTIC LAPAROSCOPY    . ESOPHAGOGASTRODUODENOSCOPY (EGD) N/A 10/27/2016   Performed by Rogene Houston, MD at [facility]  . TOTAL ABDOMINAL HYSTERECTOMY W/ BILATERAL SALPINGOOPHORECTOMY      BP (!) 176/86   Pulse 68   Ht 5\' 4"  (1.626 m)   Wt 175 lb (79.4 kg)   BMI 30.04 kg/m  Physical Exam  Constitutional: She is oriented to person, place, and time. She appears well-developed and well-nourished. No distress.  Musculoskeletal:  Gait is normal  Neurological: She is alert and oriented to person, place, and time.  Skin: She is diaphoretic.  Psychiatric: She has a normal mood and affect.  Nursing note and vitals reviewed.  Exam orthopedic  Cervical spine nontender full range of motion  Right shoulder no tenderness or swelling, has full forward elevation internal and external rotation.  Shoulder stable abduction external rotation.  She had some mild weakness in her right rotator cuff against manual muscle testing  Skin was warm dry and intact without erythema or rash.  Sensation was normal reflexes 2+ elbow.  Pulse and perfusion normal right upper extremity  Left shoulder but mild tenderness in the upper arm and deltoid swelling in the supraspinatus fossa.  Active range of motion 150 degrees of flexion with pain.  External rotation 45 degrees with arm at her side internal rotation only to the belt line versus T6 on the opposite side  Abduction external rotation test was normal with no apprehension or laxity  Internal/external rotation strength grade 5 supraspinatus grade 4 Skin warm dry and intact no rash or ulceration no lesions  or subcutaneous nodules in the left arm  Normal sensation reflexes 2+ at the elbow Pulse and perfusion normal  Provocative tests include positive impingement sign, negative Hornblower sign.  Negative drop test.

## 2017-03-27 ENCOUNTER — Telehealth: Payer: Self-pay | Admitting: Orthopedic Surgery

## 2017-03-27 NOTE — Telephone Encounter (Signed)
Kathlee Nations from [facility] states they need a referral sent to them on this patient along with demographics.  Thanks

## 2017-03-29 DIAGNOSIS — M6281 Muscle weakness (generalized): Secondary | ICD-10-CM | POA: Diagnosis not present

## 2017-03-29 DIAGNOSIS — M25612 Stiffness of left shoulder, not elsewhere classified: Secondary | ICD-10-CM | POA: Diagnosis not present

## 2017-03-29 DIAGNOSIS — M25512 Pain in left shoulder: Secondary | ICD-10-CM | POA: Diagnosis not present

## 2017-03-29 DIAGNOSIS — S46002D Unspecified injury of muscle(s) and tendon(s) of the rotator cuff of left shoulder, subsequent encounter: Secondary | ICD-10-CM | POA: Diagnosis not present

## 2017-04-03 DIAGNOSIS — S46002D Unspecified injury of muscle(s) and tendon(s) of the rotator cuff of left shoulder, subsequent encounter: Secondary | ICD-10-CM | POA: Diagnosis not present

## 2017-04-03 DIAGNOSIS — M25612 Stiffness of left shoulder, not elsewhere classified: Secondary | ICD-10-CM | POA: Diagnosis not present

## 2017-04-03 DIAGNOSIS — M25512 Pain in left shoulder: Secondary | ICD-10-CM | POA: Diagnosis not present

## 2017-04-03 DIAGNOSIS — M6281 Muscle weakness (generalized): Secondary | ICD-10-CM | POA: Diagnosis not present

## 2017-04-05 DIAGNOSIS — M25512 Pain in left shoulder: Secondary | ICD-10-CM | POA: Diagnosis not present

## 2017-04-05 DIAGNOSIS — M6281 Muscle weakness (generalized): Secondary | ICD-10-CM | POA: Diagnosis not present

## 2017-04-05 DIAGNOSIS — M25612 Stiffness of left shoulder, not elsewhere classified: Secondary | ICD-10-CM | POA: Diagnosis not present

## 2017-04-05 DIAGNOSIS — S46002D Unspecified injury of muscle(s) and tendon(s) of the rotator cuff of left shoulder, subsequent encounter: Secondary | ICD-10-CM | POA: Diagnosis not present

## 2017-04-10 DIAGNOSIS — M25512 Pain in left shoulder: Secondary | ICD-10-CM | POA: Diagnosis not present

## 2017-04-10 DIAGNOSIS — M6281 Muscle weakness (generalized): Secondary | ICD-10-CM | POA: Diagnosis not present

## 2017-04-10 DIAGNOSIS — S46002D Unspecified injury of muscle(s) and tendon(s) of the rotator cuff of left shoulder, subsequent encounter: Secondary | ICD-10-CM | POA: Diagnosis not present

## 2017-04-10 DIAGNOSIS — M25612 Stiffness of left shoulder, not elsewhere classified: Secondary | ICD-10-CM | POA: Diagnosis not present

## 2017-04-13 DIAGNOSIS — M25512 Pain in left shoulder: Secondary | ICD-10-CM | POA: Diagnosis not present

## 2017-04-13 DIAGNOSIS — M6281 Muscle weakness (generalized): Secondary | ICD-10-CM | POA: Diagnosis not present

## 2017-04-13 DIAGNOSIS — S46002D Unspecified injury of muscle(s) and tendon(s) of the rotator cuff of left shoulder, subsequent encounter: Secondary | ICD-10-CM | POA: Diagnosis not present

## 2017-04-13 DIAGNOSIS — M25612 Stiffness of left shoulder, not elsewhere classified: Secondary | ICD-10-CM | POA: Diagnosis not present

## 2017-04-14 DIAGNOSIS — Z23 Encounter for immunization: Secondary | ICD-10-CM | POA: Diagnosis not present

## 2017-04-18 ENCOUNTER — Ambulatory Visit: Payer: Medicare Other | Admitting: Family Medicine

## 2017-04-20 DIAGNOSIS — M25512 Pain in left shoulder: Secondary | ICD-10-CM | POA: Diagnosis not present

## 2017-04-20 DIAGNOSIS — M6281 Muscle weakness (generalized): Secondary | ICD-10-CM | POA: Diagnosis not present

## 2017-04-20 DIAGNOSIS — M25612 Stiffness of left shoulder, not elsewhere classified: Secondary | ICD-10-CM | POA: Diagnosis not present

## 2017-04-20 DIAGNOSIS — S46002D Unspecified injury of muscle(s) and tendon(s) of the rotator cuff of left shoulder, subsequent encounter: Secondary | ICD-10-CM | POA: Diagnosis not present

## 2017-04-21 DIAGNOSIS — M25512 Pain in left shoulder: Secondary | ICD-10-CM | POA: Diagnosis not present

## 2017-04-21 DIAGNOSIS — S46002D Unspecified injury of muscle(s) and tendon(s) of the rotator cuff of left shoulder, subsequent encounter: Secondary | ICD-10-CM | POA: Diagnosis not present

## 2017-04-21 DIAGNOSIS — M6281 Muscle weakness (generalized): Secondary | ICD-10-CM | POA: Diagnosis not present

## 2017-04-21 DIAGNOSIS — M25612 Stiffness of left shoulder, not elsewhere classified: Secondary | ICD-10-CM | POA: Diagnosis not present

## 2017-04-24 ENCOUNTER — Encounter: Payer: Self-pay | Admitting: Family Medicine

## 2017-04-24 ENCOUNTER — Ambulatory Visit (INDEPENDENT_AMBULATORY_CARE_PROVIDER_SITE_OTHER): Payer: Medicare Other | Admitting: Family Medicine

## 2017-04-24 VITALS — BP 168/96 | Ht 64.0 in | Wt 172.0 lb

## 2017-04-24 DIAGNOSIS — E538 Deficiency of other specified B group vitamins: Secondary | ICD-10-CM

## 2017-04-24 DIAGNOSIS — M25612 Stiffness of left shoulder, not elsewhere classified: Secondary | ICD-10-CM | POA: Diagnosis not present

## 2017-04-24 DIAGNOSIS — H401131 Primary open-angle glaucoma, bilateral, mild stage: Secondary | ICD-10-CM | POA: Diagnosis not present

## 2017-04-24 DIAGNOSIS — M6281 Muscle weakness (generalized): Secondary | ICD-10-CM | POA: Diagnosis not present

## 2017-04-24 DIAGNOSIS — S46002D Unspecified injury of muscle(s) and tendon(s) of the rotator cuff of left shoulder, subsequent encounter: Secondary | ICD-10-CM | POA: Diagnosis not present

## 2017-04-24 DIAGNOSIS — M25512 Pain in left shoulder: Secondary | ICD-10-CM | POA: Diagnosis not present

## 2017-04-24 DIAGNOSIS — I10 Essential (primary) hypertension: Secondary | ICD-10-CM | POA: Diagnosis not present

## 2017-04-24 DIAGNOSIS — E038 Other specified hypothyroidism: Secondary | ICD-10-CM

## 2017-04-24 MED ORDER — CYANOCOBALAMIN 1000 MCG/ML IJ SOLN
1000.0000 ug | Freq: Once | INTRAMUSCULAR | Status: AC
  Administered 2017-04-24: 1000 ug via INTRAMUSCULAR

## 2017-04-24 MED ORDER — LISINOPRIL 20 MG PO TABS: 20.0000 mg | ORAL_TABLET | Freq: Every day | ORAL | 5 refills | Status: DC

## 2017-04-24 NOTE — Patient Instructions (Signed)
DASH Eating Plan DASH stands for "Dietary Approaches to Stop Hypertension." The DASH eating plan is a healthy eating plan that has been shown to reduce high blood pressure (hypertension). It may also reduce your risk for type 2 diabetes, heart disease, and stroke. The DASH eating plan may also help with weight loss. What are tips for following this plan? General guidelines  Avoid eating more than 2,300 mg (milligrams) of salt (sodium) a day. If you have hypertension, you may need to reduce your sodium intake to 1,500 mg a day.  Limit alcohol intake to no more than 1 drink a day for nonpregnant women and 2 drinks a day for men. One drink equals 12 oz of beer, 5 oz of wine, or 1 oz of hard liquor.  Work with your health care provider to maintain a healthy body weight or to lose weight. Ask what an ideal weight is for you.  Get at least 30 minutes of exercise that causes your heart to beat faster (aerobic exercise) most days of the week. Activities may include walking, swimming, or biking.  Work with your health care provider or diet and nutrition specialist (dietitian) to adjust your eating plan to your individual calorie needs. Reading food labels  Check food labels for the amount of sodium per serving. Choose foods with less than 5 percent of the Daily Value of sodium. Generally, foods with less than 300 mg of sodium per serving fit into this eating plan.  To find whole grains, look for the word "whole" as the first word in the ingredient list. Shopping  Buy products labeled as "low-sodium" or "no salt added."  Buy fresh foods. Avoid canned foods and premade or frozen meals. Cooking  Avoid adding salt when cooking. Use salt-free seasonings or herbs instead of table salt or sea salt. Check with your health care provider or pharmacist before using salt substitutes.  Do not fry foods. Cook foods using healthy methods such as baking, boiling, grilling, and broiling instead.  Cook with  heart-healthy oils, such as olive, canola, soybean, or sunflower oil. Meal planning   Eat a balanced diet that includes: ? 5 or more servings of fruits and vegetables each day. At each meal, try to fill half of your plate with fruits and vegetables. ? Up to 6-8 servings of whole grains each day. ? Less than 6 oz of lean meat, poultry, or fish each day. A 3-oz serving of meat is about the same size as a deck of cards. One egg equals 1 oz. ? 2 servings of low-fat dairy each day. ? A serving of nuts, seeds, or beans 5 times each week. ? Heart-healthy fats. Healthy fats called Omega-3 fatty acids are found in foods such as flaxseeds and coldwater fish, like sardines, salmon, and mackerel.  Limit how much you eat of the following: ? Canned or prepackaged foods. ? Food that is high in trans fat, such as fried foods. ? Food that is high in saturated fat, such as fatty meat. ? Sweets, desserts, sugary drinks, and other foods with added sugar. ? Full-fat dairy products.  Do not salt foods before eating.  Try to eat at least 2 vegetarian meals each week.  Eat more home-cooked food and less restaurant, buffet, and fast food.  When eating at a restaurant, ask that your food be prepared with less salt or no salt, if possible. What foods are recommended? The items listed may not be a complete list. Talk with your dietitian about what   dietary choices are best for you. Grains Whole-grain or whole-wheat bread. Whole-grain or whole-wheat pasta. Brown rice. Oatmeal. Quinoa. Bulgur. Whole-grain and low-sodium cereals. Pita bread. Low-fat, low-sodium crackers. Whole-wheat flour tortillas. Vegetables Fresh or frozen vegetables (raw, steamed, roasted, or grilled). Low-sodium or reduced-sodium tomato and vegetable juice. Low-sodium or reduced-sodium tomato sauce and tomato paste. Low-sodium or reduced-sodium canned vegetables. Fruits All fresh, dried, or frozen fruit. Canned fruit in natural juice (without  added sugar). Meat and other protein foods Skinless chicken or turkey. Ground chicken or turkey. Pork with fat trimmed off. Fish and seafood. Egg whites. Dried beans, peas, or lentils. Unsalted nuts, nut butters, and seeds. Unsalted canned beans. Lean cuts of beef with fat trimmed off. Low-sodium, lean deli meat. Dairy Low-fat (1%) or fat-free (skim) milk. Fat-free, low-fat, or reduced-fat cheeses. Nonfat, low-sodium ricotta or cottage cheese. Low-fat or nonfat yogurt. Low-fat, low-sodium cheese. Fats and oils Soft margarine without trans fats. Vegetable oil. Low-fat, reduced-fat, or light mayonnaise and salad dressings (reduced-sodium). Canola, safflower, olive, soybean, and sunflower oils. Avocado. Seasoning and other foods Herbs. Spices. Seasoning mixes without salt. Unsalted popcorn and pretzels. Fat-free sweets. What foods are not recommended? The items listed may not be a complete list. Talk with your dietitian about what dietary choices are best for you. Grains Baked goods made with fat, such as croissants, muffins, or some breads. Dry pasta or rice meal packs. Vegetables Creamed or fried vegetables. Vegetables in a cheese sauce. Regular canned vegetables (not low-sodium or reduced-sodium). Regular canned tomato sauce and paste (not low-sodium or reduced-sodium). Regular tomato and vegetable juice (not low-sodium or reduced-sodium). Pickles. Olives. Fruits Canned fruit in a light or heavy syrup. Fried fruit. Fruit in cream or butter sauce. Meat and other protein foods Fatty cuts of meat. Ribs. Fried meat. Bacon. Sausage. Bologna and other processed lunch meats. Salami. Fatback. Hotdogs. Bratwurst. Salted nuts and seeds. Canned beans with added salt. Canned or smoked fish. Whole eggs or egg yolks. Chicken or turkey with skin. Dairy Whole or 2% milk, cream, and half-and-half. Whole or full-fat cream cheese. Whole-fat or sweetened yogurt. Full-fat cheese. Nondairy creamers. Whipped toppings.  Processed cheese and cheese spreads. Fats and oils Butter. Stick margarine. Lard. Shortening. Ghee. Bacon fat. Tropical oils, such as coconut, palm kernel, or palm oil. Seasoning and other foods Salted popcorn and pretzels. Onion salt, garlic salt, seasoned salt, table salt, and sea salt. Worcestershire sauce. Tartar sauce. Barbecue sauce. Teriyaki sauce. Soy sauce, including reduced-sodium. Steak sauce. Canned and packaged gravies. Fish sauce. Oyster sauce. Cocktail sauce. Horseradish that you find on the shelf. Ketchup. Mustard. Meat flavorings and tenderizers. Bouillon cubes. Hot sauce and Tabasco sauce. Premade or packaged marinades. Premade or packaged taco seasonings. Relishes. Regular salad dressings. Where to find more information:  National Heart, Lung, and Blood Institute: www.nhlbi.nih.gov  American Heart Association: www.heart.org Summary  The DASH eating plan is a healthy eating plan that has been shown to reduce high blood pressure (hypertension). It may also reduce your risk for type 2 diabetes, heart disease, and stroke.  With the DASH eating plan, you should limit salt (sodium) intake to 2,300 mg a day. If you have hypertension, you may need to reduce your sodium intake to 1,500 mg a day.  When on the DASH eating plan, aim to eat more fresh fruits and vegetables, whole grains, lean proteins, low-fat dairy, and heart-healthy fats.  Work with your health care provider or diet and nutrition specialist (dietitian) to adjust your eating plan to your individual   calorie needs. This information is not intended to replace advice given to you by your health care provider. Make sure you discuss any questions you have with your health care provider. Document Released: 04/14/2011 Document Revised: 04/18/2016 Document Reviewed: 04/18/2016 Elsevier Interactive Patient Education  2017 [employer].  

## 2017-04-24 NOTE — Progress Notes (Signed)
   Subjective:    Patient ID: Susan Mcneil, female    DOB: 03/20/1946, 71 y.o.   MRN: [id number]  HPI Patient is here today to follow up on HTN. She is currently on Lisinopril 10 mg one daily. She does not eat healthy and she does walk some.She does see Dr Aline Brochure for her shoulder. Patient has history of B12 deficiency does get B12 shots but recently got away from doing so She denies any chest tightness pressure pain shortness of breath  Review of Systems  Constitutional: Negative for activity change, fatigue and fever.  HENT: Negative for congestion.   Respiratory: Negative for cough, chest tightness and shortness of breath.   Cardiovascular: Negative for chest pain and leg swelling.  Gastrointestinal: Negative for abdominal pain.  Skin: Negative for color change.  Neurological: Negative for headaches.  Psychiatric/Behavioral: Negative for behavioral problems.       Objective:   Physical Exam  Constitutional: She appears well-developed and well-nourished. No distress.  HENT:  Head: Normocephalic and atraumatic.  Eyes: Right eye exhibits no discharge. Left eye exhibits no discharge.  Neck: No tracheal deviation present.  Cardiovascular: Normal rate, regular rhythm and normal heart sounds.  No murmur heard. Pulmonary/Chest: Effort normal and breath sounds normal. No respiratory distress. She has no wheezes. She has no rales.  Musculoskeletal: She exhibits no edema.  Lymphadenopathy:    She has no cervical adenopathy.  Neurological: She is alert. She exhibits normal muscle tone.  Skin: Skin is warm and dry. No erythema.  Psychiatric: Her behavior is normal.  Vitals reviewed.         Assessment & Plan:  Blood pressure good control continue current measures  Hypothyroidism continue medication-check TSH  B12 deficiency B12 shot today-recommend that this be done monthly  Patient to follow-up office visit in several months  B12 shot monthly via nurses

## 2017-04-26 ENCOUNTER — Other Ambulatory Visit: Payer: Self-pay | Admitting: Urology

## 2017-04-26 DIAGNOSIS — S46002D Unspecified injury of muscle(s) and tendon(s) of the rotator cuff of left shoulder, subsequent encounter: Secondary | ICD-10-CM | POA: Diagnosis not present

## 2017-04-26 DIAGNOSIS — M25512 Pain in left shoulder: Secondary | ICD-10-CM | POA: Diagnosis not present

## 2017-04-26 DIAGNOSIS — M25612 Stiffness of left shoulder, not elsewhere classified: Secondary | ICD-10-CM | POA: Diagnosis not present

## 2017-04-26 DIAGNOSIS — E038 Other specified hypothyroidism: Secondary | ICD-10-CM | POA: Diagnosis not present

## 2017-04-26 DIAGNOSIS — D3002 Benign neoplasm of left kidney: Secondary | ICD-10-CM

## 2017-04-26 DIAGNOSIS — M6281 Muscle weakness (generalized): Secondary | ICD-10-CM | POA: Diagnosis not present

## 2017-04-27 LAB — TSH: TSH: 63.25 u[IU]/mL — AB (ref 0.450–4.500)

## 2017-05-03 DIAGNOSIS — M25512 Pain in left shoulder: Secondary | ICD-10-CM | POA: Diagnosis not present

## 2017-05-03 DIAGNOSIS — M25612 Stiffness of left shoulder, not elsewhere classified: Secondary | ICD-10-CM | POA: Diagnosis not present

## 2017-05-03 DIAGNOSIS — M6281 Muscle weakness (generalized): Secondary | ICD-10-CM | POA: Diagnosis not present

## 2017-05-03 DIAGNOSIS — S46002D Unspecified injury of muscle(s) and tendon(s) of the rotator cuff of left shoulder, subsequent encounter: Secondary | ICD-10-CM | POA: Diagnosis not present

## 2017-05-08 ENCOUNTER — Ambulatory Visit (HOSPITAL_COMMUNITY)
Admission: RE | Admit: 2017-05-08 | Discharge: 2017-05-08 | Disposition: A | Discharge status: Home / Self Care | Payer: Medicare Other | Source: Ambulatory Visit | Attending: Urology | Admitting: Urology

## 2017-05-08 DIAGNOSIS — M25512 Pain in left shoulder: Secondary | ICD-10-CM | POA: Diagnosis not present

## 2017-05-08 DIAGNOSIS — M25612 Stiffness of left shoulder, not elsewhere classified: Secondary | ICD-10-CM | POA: Diagnosis not present

## 2017-05-08 DIAGNOSIS — M6281 Muscle weakness (generalized): Secondary | ICD-10-CM | POA: Diagnosis not present

## 2017-05-08 DIAGNOSIS — D3002 Benign neoplasm of left kidney: Secondary | ICD-10-CM

## 2017-05-08 DIAGNOSIS — D1771 Benign lipomatous neoplasm of kidney: Secondary | ICD-10-CM | POA: Diagnosis not present

## 2017-05-08 DIAGNOSIS — S46002D Unspecified injury of muscle(s) and tendon(s) of the rotator cuff of left shoulder, subsequent encounter: Secondary | ICD-10-CM | POA: Diagnosis not present

## 2017-05-12 ENCOUNTER — Encounter: Payer: Self-pay | Admitting: Orthopedic Surgery

## 2017-05-12 ENCOUNTER — Telehealth: Payer: Self-pay | Admitting: Radiology

## 2017-05-12 ENCOUNTER — Ambulatory Visit (INDEPENDENT_AMBULATORY_CARE_PROVIDER_SITE_OTHER): Payer: Medicare Other | Admitting: Orthopedic Surgery

## 2017-05-12 VITALS — BP 158/102 | HR 76 | Ht 64.0 in | Wt 171.0 lb

## 2017-05-12 DIAGNOSIS — G8929 Other chronic pain: Secondary | ICD-10-CM

## 2017-05-12 DIAGNOSIS — M25512 Pain in left shoulder: Secondary | ICD-10-CM

## 2017-05-12 DIAGNOSIS — M75112 Incomplete rotator cuff tear or rupture of left shoulder, not specified as traumatic: Secondary | ICD-10-CM

## 2017-05-12 NOTE — Addendum Note (Signed)
Addended byCandice Camp on: 05/12/2017 10:28 AM   Modules accepted: Orders

## 2017-05-12 NOTE — Progress Notes (Signed)
Follow-up visit for left shoulder  Chief Complaint  Patient presents with  . Shoulder Pain    left    72 year old female had a several month history of left shoulder pain with no history of trauma.  She initially had night pain and pain reaching overhead and putting her arm behind her back.  She lost motion and noted the pain radiated to but not below the elbow.  After physical therapy and injections she has modest improvement in the shoulder but still cannot fully elevate the arm and has weakness with the arm away from her especially if she is holding any objects.  She has had an injection in her physical therapy has been completed she presents for reevaluation still complaining as stated  Review of Systems  Musculoskeletal: Positive for joint pain.  Neurological: Negative for tingling.   BP (!) 158/102   Pulse 76   Ht 5\' 4"  (1.626 m)   Wt 171 lb (77.6 kg)   BMI 29.35 kg/m   Physical Exam  Constitutional: She is oriented to person, place, and time. She appears well-developed and well-nourished. No distress.  Neurological: She is alert and oriented to person, place, and time.  Skin: Skin is warm and dry. Capillary refill takes less than 2 seconds. No rash noted. She is not diaphoretic. No erythema. No pallor.  Psychiatric: She has a normal mood and affect.   Provocative test  drop arm test normal Painful arc was between 60 and 90 degrees passive abduction Empty can weakness of the rotator cuff Jobe test normal Infraspinatus muscle strength test normal External rotation lag sign negative Liftoff normal Belly press test normal Supraspinatus motor exam grade 4 weakness of the supraspinatus Impingement at 120 degrees of flexion  Range of motion internal rotation sacrum could not reach above that level  Data Reviewed Imaging of the left shoulder are independently reviewed and I interpreted these as acromioclavicular arthritis normal glenohumeral joint sclerosis of the greater  tuberosity  Recommend MRI of left shoulder  Encounter Diagnosis  Name Primary?  . Incomplete tear of left rotator cuff Yes

## 2017-05-12 NOTE — Telephone Encounter (Signed)
Jan 8th at 1:45 for MRI scan at [facility] I have called patient to advise.

## 2017-05-16 ENCOUNTER — Ambulatory Visit (HOSPITAL_COMMUNITY)
Admission: RE | Admit: 2017-05-16 | Discharge: 2017-05-16 | Disposition: A | Discharge status: Home / Self Care | Payer: Medicare Other | Source: Ambulatory Visit | Attending: Orthopedic Surgery | Admitting: Orthopedic Surgery

## 2017-05-16 DIAGNOSIS — M25512 Pain in left shoulder: Secondary | ICD-10-CM

## 2017-05-16 DIAGNOSIS — M65812 Other synovitis and tenosynovitis, left shoulder: Secondary | ICD-10-CM | POA: Diagnosis not present

## 2017-05-16 DIAGNOSIS — M75102 Unspecified rotator cuff tear or rupture of left shoulder, not specified as traumatic: Secondary | ICD-10-CM | POA: Insufficient documentation

## 2017-05-16 DIAGNOSIS — M25412 Effusion, left shoulder: Secondary | ICD-10-CM | POA: Diagnosis not present

## 2017-05-16 DIAGNOSIS — G8929 Other chronic pain: Secondary | ICD-10-CM

## 2017-05-16 DIAGNOSIS — M7552 Bursitis of left shoulder: Secondary | ICD-10-CM | POA: Insufficient documentation

## 2017-05-22 ENCOUNTER — Telehealth: Payer: Self-pay | Admitting: Orthopedic Surgery

## 2017-05-22 DIAGNOSIS — M6281 Muscle weakness (generalized): Secondary | ICD-10-CM | POA: Diagnosis not present

## 2017-05-22 DIAGNOSIS — M25612 Stiffness of left shoulder, not elsewhere classified: Secondary | ICD-10-CM | POA: Diagnosis not present

## 2017-05-22 DIAGNOSIS — M25512 Pain in left shoulder: Secondary | ICD-10-CM | POA: Diagnosis not present

## 2017-05-22 DIAGNOSIS — S46002D Unspecified injury of muscle(s) and tendon(s) of the rotator cuff of left shoulder, subsequent encounter: Secondary | ICD-10-CM | POA: Diagnosis not present

## 2017-05-22 NOTE — Telephone Encounter (Signed)
I spoke to Susan Mcneil, told her you are concerned about decreased ROM< they will keep working with her [name] has appointment with you on 05/31/17 and I told them to continue therapy, at the visit you will let me know whether or not to continue therapy... The concern is pain, I told them your main concern is decreased ROM/   To you FYI

## 2017-05-22 NOTE — Telephone Encounter (Signed)
Kathlee Nations from [facility] called regarding patient "starting back up again" - has questions regarding plan of care.  [phone]

## 2017-05-24 ENCOUNTER — Other Ambulatory Visit: Payer: Self-pay | Admitting: Orthopedic Surgery

## 2017-05-24 ENCOUNTER — Telehealth: Payer: Self-pay | Admitting: *Deleted

## 2017-05-24 MED ORDER — NABUMETONE 500 MG PO TABS: 500.0000 mg | ORAL_TABLET | Freq: Two times a day (BID) | ORAL | 5 refills | Status: DC

## 2017-05-24 NOTE — Telephone Encounter (Signed)
YES

## 2017-05-24 NOTE — Telephone Encounter (Signed)
Call received from patient's husband wanting to know if you can call her with her MRI results left shoulder. [phone] They cancelled next appointment and will reschedule it after she finishes therapy.

## 2017-05-25 DIAGNOSIS — S46002D Unspecified injury of muscle(s) and tendon(s) of the rotator cuff of left shoulder, subsequent encounter: Secondary | ICD-10-CM | POA: Diagnosis not present

## 2017-05-25 DIAGNOSIS — M25612 Stiffness of left shoulder, not elsewhere classified: Secondary | ICD-10-CM | POA: Diagnosis not present

## 2017-05-25 DIAGNOSIS — M25512 Pain in left shoulder: Secondary | ICD-10-CM | POA: Diagnosis not present

## 2017-05-25 DIAGNOSIS — M6281 Muscle weakness (generalized): Secondary | ICD-10-CM | POA: Diagnosis not present

## 2017-05-26 ENCOUNTER — Ambulatory Visit: Payer: Medicare Other

## 2017-05-26 ENCOUNTER — Ambulatory Visit (INDEPENDENT_AMBULATORY_CARE_PROVIDER_SITE_OTHER): Payer: Medicare Other | Admitting: *Deleted

## 2017-05-26 DIAGNOSIS — D510 Vitamin B12 deficiency anemia due to intrinsic factor deficiency: Secondary | ICD-10-CM | POA: Diagnosis not present

## 2017-05-26 MED ORDER — CYANOCOBALAMIN 1000 MCG/ML IJ SOLN
1000.0000 ug | Freq: Once | INTRAMUSCULAR | Status: AC
  Administered 2017-05-26: 1000 ug via INTRAMUSCULAR

## 2017-05-29 ENCOUNTER — Ambulatory Visit: Payer: Self-pay | Admitting: Orthopedic Surgery

## 2017-05-30 ENCOUNTER — Ambulatory Visit: Payer: Self-pay | Admitting: Orthopedic Surgery

## 2017-05-30 ENCOUNTER — Telehealth: Payer: Self-pay | Admitting: Radiology

## 2017-05-30 NOTE — Telephone Encounter (Signed)
request received via fax for alternative to Nabumetone, it is not on preferred list with her insurance  Preferred meds are Meloxicam/ Diclofenac (Diclofenac on back order)   Ok to change to Meloxicam?

## 2017-05-30 NOTE — Telephone Encounter (Signed)
yes

## 2017-05-31 ENCOUNTER — Ambulatory Visit: Payer: Self-pay | Admitting: Orthopedic Surgery

## 2017-05-31 DIAGNOSIS — S46002D Unspecified injury of muscle(s) and tendon(s) of the rotator cuff of left shoulder, subsequent encounter: Secondary | ICD-10-CM | POA: Diagnosis not present

## 2017-05-31 DIAGNOSIS — M6281 Muscle weakness (generalized): Secondary | ICD-10-CM | POA: Diagnosis not present

## 2017-05-31 DIAGNOSIS — M25512 Pain in left shoulder: Secondary | ICD-10-CM | POA: Diagnosis not present

## 2017-05-31 DIAGNOSIS — M25612 Stiffness of left shoulder, not elsewhere classified: Secondary | ICD-10-CM | POA: Diagnosis not present

## 2017-05-31 MED ORDER — MELOXICAM 7.5 MG PO TABS: 7.5000 mg | ORAL_TABLET | Freq: Every day | ORAL | 5 refills | Status: DC

## 2017-06-01 DIAGNOSIS — M25612 Stiffness of left shoulder, not elsewhere classified: Secondary | ICD-10-CM | POA: Diagnosis not present

## 2017-06-01 DIAGNOSIS — M25512 Pain in left shoulder: Secondary | ICD-10-CM | POA: Diagnosis not present

## 2017-06-01 DIAGNOSIS — S46002D Unspecified injury of muscle(s) and tendon(s) of the rotator cuff of left shoulder, subsequent encounter: Secondary | ICD-10-CM | POA: Diagnosis not present

## 2017-06-01 DIAGNOSIS — M6281 Muscle weakness (generalized): Secondary | ICD-10-CM | POA: Diagnosis not present

## 2017-06-07 DIAGNOSIS — M25612 Stiffness of left shoulder, not elsewhere classified: Secondary | ICD-10-CM | POA: Diagnosis not present

## 2017-06-07 DIAGNOSIS — M25512 Pain in left shoulder: Secondary | ICD-10-CM | POA: Diagnosis not present

## 2017-06-07 DIAGNOSIS — M6281 Muscle weakness (generalized): Secondary | ICD-10-CM | POA: Diagnosis not present

## 2017-06-07 DIAGNOSIS — S46002D Unspecified injury of muscle(s) and tendon(s) of the rotator cuff of left shoulder, subsequent encounter: Secondary | ICD-10-CM | POA: Diagnosis not present

## 2017-06-09 DIAGNOSIS — M25512 Pain in left shoulder: Secondary | ICD-10-CM | POA: Diagnosis not present

## 2017-06-09 DIAGNOSIS — M25612 Stiffness of left shoulder, not elsewhere classified: Secondary | ICD-10-CM | POA: Diagnosis not present

## 2017-06-09 DIAGNOSIS — S46002D Unspecified injury of muscle(s) and tendon(s) of the rotator cuff of left shoulder, subsequent encounter: Secondary | ICD-10-CM | POA: Diagnosis not present

## 2017-06-09 DIAGNOSIS — M6281 Muscle weakness (generalized): Secondary | ICD-10-CM | POA: Diagnosis not present

## 2017-06-12 DIAGNOSIS — H353122 Nonexudative age-related macular degeneration, left eye, intermediate dry stage: Secondary | ICD-10-CM | POA: Diagnosis not present

## 2017-06-12 DIAGNOSIS — H26492 Other secondary cataract, left eye: Secondary | ICD-10-CM | POA: Diagnosis not present

## 2017-06-12 DIAGNOSIS — H43813 Vitreous degeneration, bilateral: Secondary | ICD-10-CM | POA: Diagnosis not present

## 2017-06-12 DIAGNOSIS — H353222 Exudative age-related macular degeneration, left eye, with inactive choroidal neovascularization: Secondary | ICD-10-CM | POA: Diagnosis not present

## 2017-06-12 DIAGNOSIS — H353212 Exudative age-related macular degeneration, right eye, with inactive choroidal neovascularization: Secondary | ICD-10-CM | POA: Diagnosis not present

## 2017-06-12 DIAGNOSIS — H353112 Nonexudative age-related macular degeneration, right eye, intermediate dry stage: Secondary | ICD-10-CM | POA: Diagnosis not present

## 2017-06-12 DIAGNOSIS — H26491 Other secondary cataract, right eye: Secondary | ICD-10-CM | POA: Diagnosis not present

## 2017-06-13 ENCOUNTER — Ambulatory Visit (INDEPENDENT_AMBULATORY_CARE_PROVIDER_SITE_OTHER): Payer: Medicare Other | Admitting: Urology

## 2017-06-13 DIAGNOSIS — D3002 Benign neoplasm of left kidney: Secondary | ICD-10-CM

## 2017-06-14 ENCOUNTER — Encounter: Payer: Self-pay | Admitting: Family Medicine

## 2017-06-14 DIAGNOSIS — M6281 Muscle weakness (generalized): Secondary | ICD-10-CM | POA: Diagnosis not present

## 2017-06-14 DIAGNOSIS — D1771 Benign lipomatous neoplasm of kidney: Secondary | ICD-10-CM

## 2017-06-14 DIAGNOSIS — M25512 Pain in left shoulder: Secondary | ICD-10-CM | POA: Diagnosis not present

## 2017-06-14 DIAGNOSIS — S46002D Unspecified injury of muscle(s) and tendon(s) of the rotator cuff of left shoulder, subsequent encounter: Secondary | ICD-10-CM | POA: Diagnosis not present

## 2017-06-14 DIAGNOSIS — M25612 Stiffness of left shoulder, not elsewhere classified: Secondary | ICD-10-CM | POA: Diagnosis not present

## 2017-06-14 HISTORY — DX: Benign lipomatous neoplasm of kidney: D17.71

## 2017-06-16 DIAGNOSIS — S46002D Unspecified injury of muscle(s) and tendon(s) of the rotator cuff of left shoulder, subsequent encounter: Secondary | ICD-10-CM | POA: Diagnosis not present

## 2017-06-16 DIAGNOSIS — M6281 Muscle weakness (generalized): Secondary | ICD-10-CM | POA: Diagnosis not present

## 2017-06-16 DIAGNOSIS — M25512 Pain in left shoulder: Secondary | ICD-10-CM | POA: Diagnosis not present

## 2017-06-16 DIAGNOSIS — M25612 Stiffness of left shoulder, not elsewhere classified: Secondary | ICD-10-CM | POA: Diagnosis not present

## 2017-06-21 DIAGNOSIS — H26491 Other secondary cataract, right eye: Secondary | ICD-10-CM | POA: Diagnosis not present

## 2017-06-27 ENCOUNTER — Ambulatory Visit (INDEPENDENT_AMBULATORY_CARE_PROVIDER_SITE_OTHER): Payer: Medicare Other | Admitting: *Deleted

## 2017-06-27 DIAGNOSIS — E538 Deficiency of other specified B group vitamins: Secondary | ICD-10-CM

## 2017-06-27 MED ORDER — CYANOCOBALAMIN 1000 MCG/ML IJ SOLN
1000.0000 ug | Freq: Once | INTRAMUSCULAR | Status: AC
  Administered 2017-06-27: 1000 ug via INTRAMUSCULAR

## 2017-06-28 ENCOUNTER — Ambulatory Visit: Payer: Medicare Other | Admitting: Family Medicine

## 2017-06-28 ENCOUNTER — Ambulatory Visit (INDEPENDENT_AMBULATORY_CARE_PROVIDER_SITE_OTHER): Payer: Medicare Other | Admitting: Orthopedic Surgery

## 2017-06-28 VITALS — BP 129/73 | HR 77 | Ht 64.0 in | Wt 171.0 lb

## 2017-06-28 DIAGNOSIS — M75112 Incomplete rotator cuff tear or rupture of left shoulder, not specified as traumatic: Secondary | ICD-10-CM | POA: Diagnosis not present

## 2017-06-28 NOTE — Progress Notes (Signed)
Progress Note   Patient ID: Susan Mcneil, female   DOB: 1945-07-12, 72 y.o.   MRN: [id number]  Chief Complaint  Patient presents with  . Follow-up    Recheck on left shoulder.    72 year old female being treated for rotator cuff problem with interstitial but not actual full-thickness tears treated with physical therapy now on a home program she has had injection and anti-inflammatory medication she is slightly improved.     Review of Systems  Musculoskeletal: Negative for neck pain.   No outpatient medications have been marked as taking for the 06/28/17 encounter (Office Visit) with Carole Civil, MD.    No Known Allergies   BP 129/73   Pulse 77   Ht 5\' 4"  (1.626 m)   Wt 171 lb (77.6 kg)   BMI 29.35 kg/m   Physical Exam  Musculoskeletal:       Arms:    Medical decision-making Encounter Diagnosis  Name Primary?  . Incomplete tear of left rotator cuff Yes   MRI report IMPRESSION: 1. Moderate rotator cuff tendinopathy/tendinosis with interstitial tears but no full-thickness retracted tear. 2. Small articular surface tear at the anterior footprint attachment of the supraspinatus tendon. 3. Intact long head biceps tendon and glenoid labrum. 4. Small glenohumeral joint effusion and mild synovitis versus adhesive capsulitis. 5. Mild subacromial/subdeltoid bursitis.     Electronically Signed   By: Marijo Sanes M.D.   On: 05/16/2017 15:19    Assessment and plan partial thickness rotator cuff tear left shoulder  Recommend continue home therapy follow-up in 6 weeks    Arther Abbott, MD 06/28/2017 11:42 AM

## 2017-07-25 ENCOUNTER — Ambulatory Visit: Payer: Medicare Other

## 2017-07-26 ENCOUNTER — Ambulatory Visit (INDEPENDENT_AMBULATORY_CARE_PROVIDER_SITE_OTHER): Payer: Medicare Other | Admitting: *Deleted

## 2017-07-26 DIAGNOSIS — E538 Deficiency of other specified B group vitamins: Secondary | ICD-10-CM | POA: Diagnosis not present

## 2017-07-26 MED ORDER — CYANOCOBALAMIN 1000 MCG/ML IJ SOLN
1000.0000 ug | Freq: Once | INTRAMUSCULAR | Status: AC
  Administered 2017-07-26: 1000 ug via INTRAMUSCULAR

## 2017-08-09 ENCOUNTER — Encounter: Payer: Self-pay | Admitting: Orthopedic Surgery

## 2017-08-09 ENCOUNTER — Ambulatory Visit (INDEPENDENT_AMBULATORY_CARE_PROVIDER_SITE_OTHER): Payer: Medicare Other | Admitting: Orthopedic Surgery

## 2017-08-09 VITALS — BP 144/80 | HR 80 | Ht 64.0 in | Wt 170.0 lb

## 2017-08-09 DIAGNOSIS — M75112 Incomplete rotator cuff tear or rupture of left shoulder, not specified as traumatic: Secondary | ICD-10-CM

## 2017-08-09 DIAGNOSIS — M75102 Unspecified rotator cuff tear or rupture of left shoulder, not specified as traumatic: Secondary | ICD-10-CM | POA: Diagnosis not present

## 2017-08-09 DIAGNOSIS — M25512 Pain in left shoulder: Secondary | ICD-10-CM

## 2017-08-09 DIAGNOSIS — G8929 Other chronic pain: Secondary | ICD-10-CM

## 2017-08-09 NOTE — Progress Notes (Signed)
Progress Note   Patient ID: Susan Mcneil, female   DOB: [birth year], 72 y.o.   MRN: [id number]  Chief Complaint  Patient presents with  . Shoulder Pain    left    72 years old partial articular surface tear tendinitis rotator cuff left shoulder currently on meloxicam and doing home exercises.  Mild discomfort still noted but improved    Review of Systems  Neurological: Negative for tingling and sensory change.   Current Meds  Medication Sig  . Calcium Citrate (CITRACAL PO) Take 1 tablet by mouth daily.   . famotidine (PEPCID) 40 MG tablet Take 1 tablet (40 mg total) by mouth at bedtime.  Marland Kitchen levothyroxine (SYNTHROID, LEVOTHROID) 150 MCG tablet Take 1 tablet (150 mcg total) by mouth daily.  Marland Kitchen lisinopril (PRINIVIL,ZESTRIL) 20 MG tablet Take 1 tablet (20 mg total) by mouth daily.  . meloxicam (MOBIC) 7.5 MG tablet Take 1 tablet (7.5 mg total) by mouth daily.  . Multiple Vitamins-Minerals (MH MACULAR HEALTH PO) Take 1 tablet by mouth every other day.   . rosuvastatin (CRESTOR) 10 MG tablet Take 1 tablet (10 mg total) by mouth at bedtime.  . Travoprost, BAK Free, (TRAVATAN) 0.004 % SOLN ophthalmic solution Place 1 drop into the left eye at bedtime.    No Known Allergies   BP (!) 144/80   Pulse 80   Ht 5\' 4"  (1.626 m)   Wt 170 lb (77.1 kg)   BMI 29.18 kg/m   Physical Exam  Musculoskeletal:       Arms:    Medical decision-making Encounter Diagnoses  Name Primary?  . Rotator cuff syndrome of left shoulder Yes  . Chronic left shoulder pain   . Nontraumatic incomplete tear of left rotator cuff    Plan advised to continue meloxicam and home exercises if her pain gets worse her shoulder becomes weak we could certainly reevaluate situation   No orders of the defined types were placed in this encounter.    Amy Littrell, RT 08/09/2017 1:24 PM

## 2017-08-25 ENCOUNTER — Ambulatory Visit (INDEPENDENT_AMBULATORY_CARE_PROVIDER_SITE_OTHER): Payer: Medicare Other

## 2017-08-25 DIAGNOSIS — E538 Deficiency of other specified B group vitamins: Secondary | ICD-10-CM | POA: Diagnosis not present

## 2017-08-25 MED ORDER — CYANOCOBALAMIN 1000 MCG/ML IJ SOLN
1000.0000 ug | Freq: Once | INTRAMUSCULAR | Status: AC
  Administered 2017-08-25: 1000 ug via INTRAMUSCULAR

## 2017-09-22 ENCOUNTER — Ambulatory Visit: Payer: Medicare Other | Admitting: Family Medicine

## 2017-09-22 ENCOUNTER — Ambulatory Visit: Payer: Medicare Other

## 2017-09-27 ENCOUNTER — Ambulatory Visit (INDEPENDENT_AMBULATORY_CARE_PROVIDER_SITE_OTHER): Payer: Medicare Other | Admitting: Family Medicine

## 2017-09-27 ENCOUNTER — Encounter: Payer: Self-pay | Admitting: Family Medicine

## 2017-09-27 VITALS — BP 138/80 | Ht 64.0 in | Wt 168.0 lb

## 2017-09-27 DIAGNOSIS — E538 Deficiency of other specified B group vitamins: Secondary | ICD-10-CM

## 2017-09-27 DIAGNOSIS — Z1159 Encounter for screening for other viral diseases: Secondary | ICD-10-CM | POA: Diagnosis not present

## 2017-09-27 DIAGNOSIS — E038 Other specified hypothyroidism: Secondary | ICD-10-CM

## 2017-09-27 DIAGNOSIS — Z79899 Other long term (current) drug therapy: Secondary | ICD-10-CM | POA: Diagnosis not present

## 2017-09-27 DIAGNOSIS — I10 Essential (primary) hypertension: Secondary | ICD-10-CM

## 2017-09-27 DIAGNOSIS — E7849 Other hyperlipidemia: Secondary | ICD-10-CM | POA: Diagnosis not present

## 2017-09-27 MED ORDER — MELOXICAM 7.5 MG PO TABS: 7.5000 mg | ORAL_TABLET | Freq: Every day | ORAL | 5 refills | Status: DC

## 2017-09-27 MED ORDER — LISINOPRIL 20 MG PO TABS: 20.0000 mg | ORAL_TABLET | Freq: Every day | ORAL | 5 refills | Status: DC

## 2017-09-27 MED ORDER — ROSUVASTATIN CALCIUM 10 MG PO TABS: 10.0000 mg | ORAL_TABLET | Freq: Every day | ORAL | 5 refills | Status: DC

## 2017-09-27 MED ORDER — CYANOCOBALAMIN 1000 MCG/ML IJ SOLN
1000.0000 ug | Freq: Once | INTRAMUSCULAR | Status: AC
  Administered 2017-09-27: 1000 ug via INTRAMUSCULAR

## 2017-09-27 MED ORDER — LEVOTHYROXINE SODIUM 150 MCG PO TABS: 150.0000 ug | ORAL_TABLET | Freq: Every day | ORAL | 5 refills | Status: DC

## 2017-09-27 NOTE — Patient Instructions (Signed)
All exercises should be done gently. Try to fit in 10 minutes twice a day over the next few weeks,if not better by then- call us and we will set up appointment with Dr Aline Brochure  Take your meds daily as directed Do your labs  Recheck here in 6 months   Shoulder Exercises Ask your health care provider which exercises are safe for you. Do exercises exactly as told by your health care provider and adjust them as directed. It is normal to feel mild stretching, pulling, tightness, or discomfort as you do these exercises, but you should stop right away if you feel sudden pain or your pain gets worse.Do not begin these exercises until told by your health care provider. RANGE OF MOTION EXERCISES These exercises warm up your muscles and joints and improve the movement and flexibility of your shoulder. These exercises also help to relieve pain, numbness, and tingling. These exercises involve stretching your injured shoulder directly. Exercise A: Pendulum  1. Stand near a wall or a surface that you can hold onto for balance. 2. Bend at the waist and let your left / right arm hang straight down. Use your other arm to support you. Keep your back straight and do not lock your knees. 3. Relax your left / right arm and shoulder muscles, and move your hips and your trunk so your left / right arm swings freely. Your arm should swing because of the motion of your body, not because you are using your arm or shoulder muscles. 4. Keep moving your body so your arm swings in the following directions, as told by your health care provider: ? Side to side. ? Forward and backward. ? In clockwise and counterclockwise circles. 5. Continue each motion for _____15_____ seconds, or for as long as told by your health care provider. 6. Slowly return to the starting position. Repeat ____3______ times. Complete this exercise _____2_____ times a day. Exercise B:Flexion, Standing  1. Stand and hold a broomstick, a cane, or a  similar object. Place your hands a little more than shoulder-width apart on the object. Your left / right hand should be palm-up, and your other hand should be palm-down. 2. Keep your elbow straight and keep your shoulder muscles relaxed. Push the stick down with your healthy arm to raise your left / right arm in front of your body, and then over your head until you feel a stretch in your shoulder. ? Avoid shrugging your shoulder while you raise your arm. Keep your shoulder blade tucked down toward the middle of your back. 3. Hold for _______5___ seconds. 4. Slowly return to the starting position. Repeat _____5_____ times. Complete this exercise __2________ times a day. Exercise C: Abduction, Standing 1. Stand and hold a broomstick, a cane, or a similar object. Place your hands a little more than shoulder-width apart on the object. Your left / right hand should be palm-up, and your other hand should be palm-down. 2. While keeping your elbow straight and your shoulder muscles relaxed, push the stick across your body toward your left / right side. Raise your left / right arm to the side of your body and then over your head until you feel a stretch in your shoulder. ? Do not raise your arm above shoulder height, unless your health care provider tells you to do that. ? Avoid shrugging your shoulder while you raise your arm. Keep your shoulder blade tucked down toward the middle of your back. 3. Hold for ____5______ seconds. 4. Slowly  return to the starting position. Repeat _____3_____ times. Complete this exercise ____2______ times a day. Exercise D:Internal Rotation  1. Place your left / right hand behind your back, palm-up. 2. Use your other hand to dangle an exercise band, a towel, or a similar object over your shoulder. Grasp the band with your left / right hand so you are holding onto both ends. 3. Gently pull up on the band until you feel a stretch in the front of your left / right  shoulder. ? Avoid shrugging your shoulder while you raise your arm. Keep your shoulder blade tucked down toward the middle of your back. 4. Hold for _____5_____ seconds. 5. Release the stretch by letting go of the band and lowering your hands. Repeat _____3_____ times. Complete this exercise ________2__ times a day. STRETCHING EXERCISES These exercises warm up your muscles and joints and improve the movement and flexibility of your shoulder. These exercises also help to relieve pain, numbness, and tingling. These exercises are done using your healthy shoulder to help stretch the muscles of your injured shoulder. Exercise E: Warehouse manager (External Rotation and Abduction)  1. Stand in a doorway with one of your feet slightly in front of the other. This is called a staggered stance. If you cannot reach your forearms to the door frame, stand facing a corner of a room. 2. Choose one of the following positions as told by your health care provider: ? Place your hands and forearms on the door frame above your head. ? Place your hands and forearms on the door frame at the height of your head. ? Place your hands on the door frame at the height of your elbows. 3. Slowly move your weight onto your front foot until you feel a stretch across your chest and in the front of your shoulders. Keep your head and chest upright and keep your abdominal muscles tight. 4. Hold for __________ seconds. 5. To release the stretch, shift your weight to your back foot. Repeat __________ times. Complete this stretch __________ times a day. Exercise F:Extension, Standing 1. Stand and hold a broomstick, a cane, or a similar object behind your back. ? Your hands should be a little wider than shoulder-width apart. ? Your palms should face away from your back. 2. Keeping your elbows straight and keeping your shoulder muscles relaxed, move the stick away from your body until you feel a stretch in your shoulder. ? Avoid  shrugging your shoulders while you move the stick. Keep your shoulder blade tucked down toward the middle of your back. 3. Hold for __________ seconds. 4. Slowly return to the starting position. Repeat __________ times. Complete this exercise __________ times a day. STRENGTHENING EXERCISES These exercises build strength and endurance in your shoulder. Endurance is the ability to use your muscles for a long time, even after they get tired. Exercise G:External Rotation  1. Sit in a stable chair without armrests. 2. Secure an exercise band at elbow height on your left / right side. 3. Place a soft object, such as a folded towel or a small pillow, between your left / right upper arm and your body to move your elbow a few inches away (about 10 cm) from your side. 4. Hold the end of the band so it is tight and there is no slack. 5. Keeping your elbow pressed against the soft object, move your left / right forearm out, away from your abdomen. Keep your body steady so only your forearm moves. 6. Hold  for __________ seconds. 7. Slowly return to the starting position. Repeat __________ times. Complete this exercise __________ times a day. Exercise H:Shoulder Abduction  1. Sit in a stable chair without armrests, or stand. 2. Hold a __________ weight in your left / right hand, or hold an exercise band with both hands. 3. Start with your arms straight down and your left / right palm facing in, toward your body. 4. Slowly lift your left / right hand out to your side. Do not lift your hand above shoulder height unless your health care provider tells you that this is safe. ? Keep your arms straight. ? Avoid shrugging your shoulder while you do this movement. Keep your shoulder blade tucked down toward the middle of your back. 5. Hold for __________ seconds. 6. Slowly lower your arm, and return to the starting position. Repeat __________ times. Complete this exercise __________ times a day. Exercise  I:Shoulder Extension 1. Sit in a stable chair without armrests, or stand. 2. Secure an exercise band to a stable object in front of you where it is at shoulder height. 3. Hold one end of the exercise band in each hand. Your palms should face each other. 4. Straighten your elbows and lift your hands up to shoulder height. 5. Step back, away from the secured end of the exercise band, until the band is tight and there is no slack. 6. Squeeze your shoulder blades together as you pull your hands down to the sides of your thighs. Stop when your hands are straight down by your sides. Do not let your hands go behind your body. 7. Hold for __________ seconds. 8. Slowly return to the starting position. Repeat __________ times. Complete this exercise __________ times a day. Exercise J:Standing Shoulder Row 1. Sit in a stable chair without armrests, or stand. 2. Secure an exercise band to a stable object in front of you so it is at waist height. 3. Hold one end of the exercise band in each hand. Your palms should be in a thumbs-up position. 4. Bend each of your elbows to an "L" shape (about 90 degrees) and keep your upper arms at your sides. 5. Step back until the band is tight and there is no slack. 6. Slowly pull your elbows back behind you. 7. Hold for __________ seconds. 8. Slowly return to the starting position. Repeat __________ times. Complete this exercise __________ times a day. Exercise K:Shoulder Press-Ups  1. Sit in a stable chair that has armrests. Sit upright, with your feet flat on the floor. 2. Put your hands on the armrests so your elbows are bent and your fingers are pointing forward. Your hands should be about even with the sides of your body. 3. Push down on the armrests and use your arms to lift yourself off of the chair. Straighten your elbows and lift yourself up as much as you comfortably can. ? Move your shoulder blades down, and avoid letting your shoulders move up toward  your ears. ? Keep your feet on the ground. As you get stronger, your feet should support less of your body weight as you lift yourself up. 4. Hold for __________ seconds. 5. Slowly lower yourself back into the chair. Repeat __________ times. Complete this exercise __________ times a day. Exercise L: Wall Push-Ups  1. Stand so you are facing a stable wall. Your feet should be about one arm-length away from the wall. 2. Lean forward and place your palms on the wall at shoulder height. 3.  Keep your feet flat on the floor as you bend your elbows and lean forward toward the wall. 4. Hold for __________ seconds. 5. Straighten your elbows to push yourself back to the starting position. Repeat __________ times. Complete this exercise __________ times a day. This information is not intended to replace advice given to you by your health care provider. Make sure you discuss any questions you have with your health care provider. Document Released: 03/09/2005 Document Revised: 01/18/2016 Document Reviewed: 01/04/2015 Elsevier Interactive Patient Education  2018 [employer].

## 2017-09-27 NOTE — Progress Notes (Signed)
   Subjective:    Patient ID: Susan Mcneil, female    DOB: [birth year], 72 y.o.   MRN: [id number]  Hypertension  This is a chronic problem. Pertinent negatives include no chest pain, headaches or shortness of breath. There are no compliance problems (takes meds every day, eats healthy, exercises).    Patient for blood pressure check up. Patient relates compliance with meds. Todays BP reviewed with the patient. Patient denies issues with medication. Patient relates reasonable diet. Patient tries to minimize salt. Patient aware of BP goals.  Hit left shoulder on corner of dresser about one month ago. Hurts to move it now. Swelling on arm and chest since then.  Relates intermittent pain discomfort.  Does have cognitive dysfunction.  States her memory seems to be hanging in there.  Denies getting lost.  Her husband does the driving.  Patient states she does help out around the house with basic housecleaning chores and other measures denies being depressed   b12 injection given today.  She does have B12 deficiency.  She does need follow-up regarding this.  Does get her injections on a monthly basis.   Review of Systems  Constitutional: Negative for activity change, appetite change, fatigue and fever.  HENT: Negative for congestion and rhinorrhea.   Respiratory: Negative for cough, shortness of breath and stridor.   Cardiovascular: Negative for chest pain and leg swelling.  Gastrointestinal: Negative for abdominal pain and constipation.  Endocrine: Negative for polydipsia and polyphagia.  Genitourinary: Negative for dysuria and flank pain.  Musculoskeletal: Negative for arthralgias and back pain.  Skin: Negative for color change.  Neurological: Negative for weakness and headaches.  Psychiatric/Behavioral: Negative for agitation and confusion.       Objective:   Physical Exam  Constitutional: She appears well-nourished. No distress.  HENT:  Head: Normocephalic and atraumatic.  Eyes:  Right eye exhibits no discharge. Left eye exhibits no discharge.  Neck: No tracheal deviation present.  Cardiovascular: Normal rate, regular rhythm and normal heart sounds.  No murmur heard. Pulmonary/Chest: Effort normal and breath sounds normal. No stridor. No respiratory distress. She has no wheezes.  Musculoskeletal: She exhibits no edema or deformity.  Lymphadenopathy:    She has no cervical adenopathy.  Neurological: She is alert. She exhibits normal muscle tone.  Skin: Skin is warm and dry.  Psychiatric: She has a normal mood and affect. Her behavior is normal.  Vitals reviewed.  Shoulder has good range of motion I think this is more contusion I do not find evidence of a rotator cuff tear       Assessment & Plan:  B12 deficiency-check B12 level continue B12 shots  Blood pressure good control continue current measures patient encouraged to be compliant with medicine watch diet stay active  Hypothyroidism she is encouraged to be compliant check her TSH await the findings of this test  Hyperlipidemia previous labs reviewed no need to repeat labs after today if all is well we will stick with current regimen  Shoulder contusion do stretching exercises if ongoing troubles notify us we can help set you up with orthopedics

## 2017-09-28 LAB — LIPID PANEL
CHOLESTEROL TOTAL: 171 mg/dL (ref 100–199)
Chol/HDL Ratio: 3 ratio (ref 0.0–4.4)
HDL: 57 mg/dL (ref >39)
LDL Calculated: 91 mg/dL (ref 0–99)
Triglycerides: 115 mg/dL (ref 0–149)
VLDL CHOLESTEROL CAL: 23 mg/dL (ref 5–40)

## 2017-09-28 LAB — HEPATITIS C ANTIBODY: Hep C Virus Ab: <0.1 s/co ratio (ref 0.0–0.9)

## 2017-09-28 LAB — HEPATIC FUNCTION PANEL
ALBUMIN: 4.6 g/dL (ref 3.5–4.8)
ALT: 13 IU/L (ref 0–32)
AST: 18 IU/L (ref 0–40)
Alkaline Phosphatase: 88 IU/L (ref 39–117)
Bilirubin Total: 0.3 mg/dL (ref 0.0–1.2)
Bilirubin, Direct: 0.09 mg/dL (ref 0.00–0.40)
TOTAL PROTEIN: 7.2 g/dL (ref 6.0–8.5)

## 2017-09-28 LAB — TSH: TSH: 0.087 u[IU]/mL — ABNORMAL LOW (ref 0.450–4.500)

## 2017-09-28 LAB — VITAMIN B12: Vitamin B-12: >2000 pg/mL — ABNORMAL HIGH (ref 232–1245)

## 2017-10-04 ENCOUNTER — Other Ambulatory Visit: Payer: Self-pay

## 2017-10-04 ENCOUNTER — Other Ambulatory Visit: Payer: Self-pay | Admitting: Family Medicine

## 2017-10-04 DIAGNOSIS — E039 Hypothyroidism, unspecified: Secondary | ICD-10-CM

## 2017-10-04 MED ORDER — LEVOTHYROXINE SODIUM 150 MCG PO TABS: ORAL_TABLET | ORAL | 5 refills | Status: DC

## 2017-10-04 NOTE — Progress Notes (Unsigned)
tsh

## 2017-10-16 DIAGNOSIS — H353211 Exudative age-related macular degeneration, right eye, with active choroidal neovascularization: Secondary | ICD-10-CM | POA: Diagnosis not present

## 2017-10-16 DIAGNOSIS — H353222 Exudative age-related macular degeneration, left eye, with inactive choroidal neovascularization: Secondary | ICD-10-CM | POA: Diagnosis not present

## 2017-10-16 DIAGNOSIS — H26492 Other secondary cataract, left eye: Secondary | ICD-10-CM | POA: Diagnosis not present

## 2017-10-16 DIAGNOSIS — H353112 Nonexudative age-related macular degeneration, right eye, intermediate dry stage: Secondary | ICD-10-CM | POA: Diagnosis not present

## 2017-10-30 DIAGNOSIS — H401131 Primary open-angle glaucoma, bilateral, mild stage: Secondary | ICD-10-CM | POA: Diagnosis not present

## 2017-10-31 ENCOUNTER — Ambulatory Visit (INDEPENDENT_AMBULATORY_CARE_PROVIDER_SITE_OTHER): Payer: Medicare Other | Admitting: Family Medicine

## 2017-10-31 ENCOUNTER — Encounter: Payer: Self-pay | Admitting: Family Medicine

## 2017-10-31 VITALS — Ht 64.0 in | Wt 168.0 lb

## 2017-10-31 DIAGNOSIS — E538 Deficiency of other specified B group vitamins: Secondary | ICD-10-CM

## 2017-10-31 MED ORDER — CYANOCOBALAMIN 1000 MCG/ML IJ SOLN
1000.0000 ug | Freq: Once | INTRAMUSCULAR | Status: AC
  Administered 2017-10-31: 1000 ug via INTRAMUSCULAR

## 2017-11-17 ENCOUNTER — Encounter: Payer: Self-pay | Admitting: Nurse Practitioner

## 2017-11-17 ENCOUNTER — Encounter (INDEPENDENT_AMBULATORY_CARE_PROVIDER_SITE_OTHER): Payer: Self-pay | Admitting: Internal Medicine

## 2017-11-17 ENCOUNTER — Ambulatory Visit (INDEPENDENT_AMBULATORY_CARE_PROVIDER_SITE_OTHER): Payer: Medicare Other | Admitting: Internal Medicine

## 2017-11-17 ENCOUNTER — Ambulatory Visit (INDEPENDENT_AMBULATORY_CARE_PROVIDER_SITE_OTHER): Payer: Medicare Other | Admitting: Nurse Practitioner

## 2017-11-17 VITALS — BP 142/86 | Temp 98.3°F | Ht 64.0 in | Wt 167.0 lb

## 2017-11-17 VITALS — BP 170/90 | HR 64 | Temp 98.0°F | Ht 64.0 in | Wt 166.6 lb

## 2017-11-17 DIAGNOSIS — R222 Localized swelling, mass and lump, trunk: Secondary | ICD-10-CM | POA: Diagnosis not present

## 2017-11-17 DIAGNOSIS — K219 Gastro-esophageal reflux disease without esophagitis: Secondary | ICD-10-CM | POA: Diagnosis not present

## 2017-11-17 DIAGNOSIS — E039 Hypothyroidism, unspecified: Secondary | ICD-10-CM | POA: Diagnosis not present

## 2017-11-17 DIAGNOSIS — Z1211 Encounter for screening for malignant neoplasm of colon: Secondary | ICD-10-CM

## 2017-11-17 DIAGNOSIS — E038 Other specified hypothyroidism: Secondary | ICD-10-CM

## 2017-11-17 DIAGNOSIS — K2270 Barrett's esophagus without dysplasia: Secondary | ICD-10-CM | POA: Diagnosis not present

## 2017-11-17 DIAGNOSIS — R5383 Other fatigue: Secondary | ICD-10-CM

## 2017-11-17 NOTE — Patient Instructions (Addendum)
OV in 1 year. Continue Pepcid at night

## 2017-11-17 NOTE — Progress Notes (Signed)
Subjective:    Patient ID: Susan Mcneil, female    DOB: Oct 20, 1945, 72 y.o.   MRN: [id number]  Patient did not bring medications.  HPI Here today for f/u. Last seen in March of 2018. Underwent an EGD in June of 2018 (anemia). No further work up.  Biopsy reveals short segment Barrett's.  Hiatal hernia. Normal exam. Last colonoscopy in 2009 (screening). Left sided diverticulosis with one diverticulum in ascending colon, most diverticula at the sigmoid colon and they were small in diameter. No evidence of colonic polyps or neoplasm.   Hemoglobin in March of this year 12.0 (normal). She tells me she is doing. She denies any GERD. No trouble swallowing.  GERD controled wit PEPCID. No abdominal pain.  She has a BM every day. No melena or BRRB.   Retired Merchandiser, retail. Married 3 children. Good health.   CBC    Component Value Date/Time   WBC 5.3 08/04/2016 0932   RBC 4.72 08/04/2016 0932   HGB 12.0 08/04/2016 0932   HGB 12.2 06/28/2016 0857   HCT 38.1 08/04/2016 0932   HCT 39.6 06/28/2016 0857   PLT 288 08/04/2016 0932   PLT 282 06/28/2016 0857   MCV 80.7 08/04/2016 0932   MCV 80 06/28/2016 0857   MCH 25.4 (L) 08/04/2016 0932   MCHC 31.5 (L) 08/04/2016 0932   RDW 16.5 (H) 08/04/2016 0932   RDW 16.4 (H) 06/28/2016 0857   LYMPHSABS 1.9 06/28/2016 0857   MONOABS 0.4 07/13/2015 2328   EOSABS 0.1 06/28/2016 0857   BASOSABS 0.0 06/28/2016 0857     Review of Systems  Past Medical History:  Diagnosis Date  . 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 Fatty liver   . HTN (hypertension)    cholesterol & thyroid  . Hypothyroidism   . Kidney stone   . Macular degeneration   . Macular degeneration, bilateral   . Memory disturbance   . Osteopenia   . Osteoporosis   . Reflux gastritis   . Thyroid disorder     Past Surgical  History:  Procedure Laterality Date  . ABDOMINAL HYSTERECTOMY   patial, pre-uterine cancer, 1974  . BIOPSY  10/27/2016   Procedure: BIOPSY;  Surgeon: Rogene Houston, MD;  Location: [facility];  Service: Endoscopy;;  esophagus  . COLONOSCOPY N/A 04/01/2015   Procedure: COLONOSCOPY;  Surgeon: Rogene Houston, MD;  Location: [facility];  Service: Endoscopy;  Laterality: N/A;  1030  . DIAGNOSTIC LAPAROSCOPY    . ESOPHAGOGASTRODUODENOSCOPY N/A 10/27/2016   Procedure: ESOPHAGOGASTRODUODENOSCOPY (EGD);  Surgeon: Rogene Houston, MD;  Location: [facility];  Service: Endoscopy;  Laterality: N/A;  100  . TOTAL ABDOMINAL HYSTERECTOMY W/ BILATERAL SALPINGOOPHORECTOMY      No Known Allergies  Current Outpatient Medications on File Prior to Visit  Medication Sig Dispense Refill  . Calcium Citrate (CITRACAL PO) Take 1 tablet by mouth daily.     . famotidine (PEPCID) 40 MG tablet Take 1 tablet (40 mg total) by mouth at bedtime. 90 tablet 3  . levothyroxine (SYNTHROID, LEVOTHROID) 150 MCG tablet Take one half (1/2) tab on Mondays; take one tab on all other days 30 tablet 5  . lisinopril (PRINIVIL,ZESTRIL) 20 MG tablet Take 1 tablet (20 mg total) by mouth daily. 30 tablet 5  . Multiple Vitamins-Minerals ([place] PO) Take  1 tablet by mouth as needed.     . rosuvastatin (CRESTOR) 10 MG tablet Take 1 tablet (10 mg total) by mouth at bedtime. 30 tablet 5  . Travoprost, BAK Free, (TRAVATAN) 0.004 % SOLN ophthalmic solution Place 1 drop into the left eye at bedtime.     No current facility-administered medications on file prior to visit.         Objective:   Physical Exam Blood pressure (!) 170/90, pulse 64, temperature 98 F (36.7 C), height 5\' 4"  (1.626 m), weight 166 lb 9.6 oz (75.6 kg). Alert and oriented. Skin warm and dry. Oral mucosa is moist.   . Sclera anicteric, conjunctivae is pink. Thyroid not enlarged. No cervical lymphadenopathy. Lungs clear. Heart regular rate and rhythm.   Abdomen is soft. Bowel sounds are positive. No hepatomegaly. No abdominal masses felt. No tenderness.  No edema to lower extremities.           Assessment & Plan:  GERD. Continue the Pepcid at hs.  Anemia. H and H normal. Will see back in 1 year.  Screening colonoscopy. Last colonoscopy in 2009.

## 2017-11-18 ENCOUNTER — Encounter: Payer: Self-pay | Admitting: Nurse Practitioner

## 2017-11-18 LAB — TSH: TSH: 24.23 u[IU]/mL — ABNORMAL HIGH (ref 0.450–4.500)

## 2017-11-18 NOTE — Progress Notes (Signed)
Subjective: Presents with her husband for complaints of localized swelling just above the left clavicle just adjacent to the left neck area.  Has been going on for months.  No fevers.  No cough or shortness of breath.  No specific triggers.  Nontender.  Notices it more in the morning, gradually improved some during the day.  Her husband is also noted a slight amount on the right side.  No change in energy.  Quit smoking in 2002.  Upon further questioning patient mentions that she has discussed this with Dr. Nicki Reaper in the past.  Last seen on 03/13/2017 for this issue.  Patient also has problems with her left shoulder which is being followed by Dr. Aline Brochure.  Thyroid medication was decreased at the end of May.  Objective:   BP (!) 142/86   Temp 98.3 F (36.8 C) (Oral)   Ht 5\' 4"  (1.626 m)   Wt 167 lb (75.8 kg)   BMI 28.67 kg/m  NAD.  Alert, oriented.  TMs minimal clear effusion, no erythema.  Pharynx clear.  Neck supple with minimal adenopathy.  Area of soft fullness noted just above the left clavicle adjacent to the neck area.  No mass or nodule is noted.  Nontender.  A similar smaller area is noted on the right side.  Thyroid nontender, no mass or goiter noted.  Lungs clear.  Heart regular rate and rhythm. Chest x-ray dated 03/13/2017 showed no nodules or masses.  She also had an MRI of her left shoulder on 05/16/2017 which showed no changes to explain the symptoms.  Assessment:   Problem List Items Addressed This Visit      Endocrine   Hypothyroidism    Other Visit Diagnoses    Fatigue, unspecified type    -  Primary   Supraclavicular fossa fullness           Plan: No further intervention needed for neck symptoms at this point.  TSH pending.  Recommend further follow-up with Dr. Nicki Reaper if any problems.  Otherwise routine follow-up.

## 2017-11-20 DIAGNOSIS — Z1211 Encounter for screening for malignant neoplasm of colon: Secondary | ICD-10-CM | POA: Insufficient documentation

## 2017-11-27 DIAGNOSIS — H353211 Exudative age-related macular degeneration, right eye, with active choroidal neovascularization: Secondary | ICD-10-CM | POA: Diagnosis not present

## 2017-11-27 DIAGNOSIS — H353122 Nonexudative age-related macular degeneration, left eye, intermediate dry stage: Secondary | ICD-10-CM | POA: Diagnosis not present

## 2017-11-27 DIAGNOSIS — H35351 Cystoid macular degeneration, right eye: Secondary | ICD-10-CM | POA: Diagnosis not present

## 2017-11-27 DIAGNOSIS — H353112 Nonexudative age-related macular degeneration, right eye, intermediate dry stage: Secondary | ICD-10-CM | POA: Diagnosis not present

## 2017-12-05 ENCOUNTER — Other Ambulatory Visit: Payer: Self-pay | Admitting: Family Medicine

## 2017-12-05 DIAGNOSIS — E038 Other specified hypothyroidism: Secondary | ICD-10-CM

## 2017-12-05 MED ORDER — LEVOTHYROXINE SODIUM 175 MCG PO TABS: ORAL_TABLET | ORAL | 4 refills | Status: DC

## 2017-12-12 ENCOUNTER — Encounter (INDEPENDENT_AMBULATORY_CARE_PROVIDER_SITE_OTHER): Payer: Self-pay | Admitting: *Deleted

## 2017-12-12 ENCOUNTER — Telehealth (INDEPENDENT_AMBULATORY_CARE_PROVIDER_SITE_OTHER): Payer: Self-pay | Admitting: *Deleted

## 2017-12-12 NOTE — Telephone Encounter (Signed)
Patient needs suprep 

## 2017-12-13 MED ORDER — SUPREP BOWEL PREP KIT 17.5-3.13-1.6 GM/177ML PO SOLN: 1.0000 | Freq: Once | ORAL | 0 refills | Status: AC

## 2017-12-15 ENCOUNTER — Ambulatory Visit (INDEPENDENT_AMBULATORY_CARE_PROVIDER_SITE_OTHER): Payer: Medicare Other | Admitting: Family Medicine

## 2017-12-15 ENCOUNTER — Encounter: Payer: Self-pay | Admitting: Family Medicine

## 2017-12-15 VITALS — BP 132/84 | Ht 64.0 in | Wt 168.0 lb

## 2017-12-15 DIAGNOSIS — E538 Deficiency of other specified B group vitamins: Secondary | ICD-10-CM

## 2017-12-15 DIAGNOSIS — F09 Unspecified mental disorder due to known physiological condition: Secondary | ICD-10-CM | POA: Diagnosis not present

## 2017-12-15 DIAGNOSIS — R413 Other amnesia: Secondary | ICD-10-CM | POA: Diagnosis not present

## 2017-12-15 DIAGNOSIS — I10 Essential (primary) hypertension: Secondary | ICD-10-CM | POA: Diagnosis not present

## 2017-12-15 DIAGNOSIS — E038 Other specified hypothyroidism: Secondary | ICD-10-CM

## 2017-12-15 DIAGNOSIS — E7849 Other hyperlipidemia: Secondary | ICD-10-CM

## 2017-12-15 MED ORDER — CYANOCOBALAMIN 1000 MCG/ML IJ SOLN
1000.0000 ug | Freq: Once | INTRAMUSCULAR | Status: AC
  Administered 2017-12-15: 1000 ug via INTRAMUSCULAR

## 2017-12-15 MED ORDER — DONEPEZIL HCL 5 MG PO TABS: ORAL_TABLET | ORAL | 4 refills | Status: DC

## 2017-12-15 NOTE — Progress Notes (Signed)
Subjective:    Patient ID: Susan Mcneil, female    DOB: Aug 29, 1945, 72 y.o.   MRN: [id number]  HPI Pt here for one month follow up. Recently changed thyroid medication. Pt does need refills; stopped taking Pepcid due to she did not think she needed it.   Patient has B12 deficiency see if she usually uses her medication injected 1 mL monthly but has not done this in the past month  Patient has hypothyroidism her husband states that sometimes she takes her medicine sometimes she does not  Patient also has lack of interest in doing things not depressed just does not seem interested in having difficult time remembering things been starting to get lost when she drives Her husband states that he helps her by her helping fix food and they go out to keep she can do basic chores around the house and get dressed and bathing  Patient has had some memory dysfunction problems in the past but it appears to be getting worse  Review of Systems  Constitutional: Negative for activity change, appetite change and fatigue.  HENT: Negative for congestion and rhinorrhea.   Eyes: Negative for discharge.  Respiratory: Negative for cough, chest tightness and wheezing.   Cardiovascular: Negative for chest pain.  Gastrointestinal: Negative for abdominal pain, blood in stool and vomiting.  Endocrine: Negative for polyphagia.  Genitourinary: Negative for difficulty urinating and frequency.  Musculoskeletal: Negative for neck pain.  Skin: Negative for color change.  Allergic/Immunologic: Negative for environmental allergies and food allergies.  Neurological: Negative for weakness and headaches.  Psychiatric/Behavioral: Negative for agitation and behavioral problems.       Objective:   Physical Exam  Constitutional: She is oriented to person, place, and time. She appears well-developed and well-nourished.  HENT:  Head: Normocephalic.  Right Ear: External ear normal.  Left Ear: External ear normal.  Eyes:  Pupils are equal, round, and reactive to light.  Neck: Normal range of motion. No thyromegaly present.  Cardiovascular: Normal rate, regular rhythm, normal heart sounds and intact distal pulses.  No murmur heard. Pulmonary/Chest: Effort normal and breath sounds normal. No respiratory distress. She has no wheezes.  Abdominal: Soft. Bowel sounds are normal. She exhibits no distension and no mass. There is no tenderness.  Musculoskeletal: Normal range of motion. She exhibits no edema or tenderness.  Lymphadenopathy:    She has no cervical adenopathy.  Neurological: She is alert and oriented to person, place, and time. She exhibits normal muscle tone.  Skin: Skin is warm and dry.  Psychiatric: She has a normal mood and affect. Her behavior is normal.          Assessment & Plan:  25 minutes was spent with the patient.  This statement verifies that 25 minutes was indeed spent with the patient.  More than 50% of this visit-total duration of the visit-was spent in counseling and coordination of care. The issues that the patient came in for today as reflected in the diagnosis (s) please refer to documentation for further details.  Progressive cognitive dysfunction with memory loss severe short-term memory loss issues start to cause functional issues I have advised the patient no further driving.  In addition to this her husband does a good job watching after her medicines we will do MRI of the brain go ahead and start Aricept 5 mg follow-up in September follow-up sooner if problems with  Mild dementia could well have Alzheimer's issues needs an MRI of the brain because of cognitive  changes I have offered neurology consultation family defers currently  Hypothyroidism recheck TSH toward the end of September part of the issue is patient not reliably taking medicine husband will take a more active role  B12 deficiency 1 mL monthly  The patient was seen today as part of an evaluation regarding  hyperlipidemia.  Recent lab work has been reviewed with the patient as well as the goals for good cholesterol care.  In addition to this medications have been discussed the importance of compliance with diet and medications discussed as well.  Finally the patient is aware that poor control of cholesterol, noncompliance can dramatically increase the risk of complications. The patient will keep regular office visits and the patient does agreed to periodic lab work.  HTN- Patient was seen today as part of a visit regarding hypertension. The importance of healthy diet and regular physical activity was discussed. The importance of compliance with medications discussed.  Ideal goal is to keep blood pressure low elevated levels certainly below 161/09 when possible.  The patient was counseled that keeping blood pressure under control lessen his risk of complications.  The importance of regular follow-ups was discussed with the patient.  Low-salt diet such as DASH recommended.  Regular physical activity was recommended as well.  Patient was advised to keep regular follow-ups.

## 2017-12-26 ENCOUNTER — Ambulatory Visit (HOSPITAL_COMMUNITY): Payer: Medicare Other

## 2017-12-27 ENCOUNTER — Ambulatory Visit (HOSPITAL_COMMUNITY)
Admission: RE | Admit: 2017-12-27 | Discharge: 2017-12-27 | Disposition: A | Discharge status: Home / Self Care | Payer: Medicare Other | Source: Ambulatory Visit | Attending: Family Medicine | Admitting: Family Medicine

## 2017-12-27 DIAGNOSIS — R531 Weakness: Secondary | ICD-10-CM | POA: Diagnosis not present

## 2017-12-27 DIAGNOSIS — R9082 White matter disease, unspecified: Secondary | ICD-10-CM | POA: Diagnosis not present

## 2017-12-27 DIAGNOSIS — R413 Other amnesia: Secondary | ICD-10-CM | POA: Diagnosis not present

## 2018-01-02 DIAGNOSIS — H353211 Exudative age-related macular degeneration, right eye, with active choroidal neovascularization: Secondary | ICD-10-CM | POA: Diagnosis not present

## 2018-01-02 DIAGNOSIS — H35351 Cystoid macular degeneration, right eye: Secondary | ICD-10-CM | POA: Diagnosis not present

## 2018-01-16 ENCOUNTER — Ambulatory Visit (INDEPENDENT_AMBULATORY_CARE_PROVIDER_SITE_OTHER): Payer: Medicare Other

## 2018-01-16 DIAGNOSIS — E538 Deficiency of other specified B group vitamins: Secondary | ICD-10-CM | POA: Diagnosis not present

## 2018-01-16 MED ORDER — CYANOCOBALAMIN 1000 MCG/ML IJ SOLN
1000.0000 ug | Freq: Once | INTRAMUSCULAR | Status: AC
  Administered 2018-01-16: 1000 ug via INTRAMUSCULAR

## 2018-01-17 ENCOUNTER — Ambulatory Visit (HOSPITAL_COMMUNITY)
Admission: RE | Admit: 2018-01-17 | Discharge: 2018-01-17 | Disposition: A | Discharge status: Home / Self Care | Payer: Medicare Other | Source: Ambulatory Visit | Attending: Internal Medicine | Admitting: Internal Medicine

## 2018-01-17 ENCOUNTER — Other Ambulatory Visit: Payer: Self-pay

## 2018-01-17 ENCOUNTER — Encounter (HOSPITAL_COMMUNITY)
Admission: RE | Disposition: A | Discharge status: Home / Self Care | Payer: Self-pay | Source: Ambulatory Visit | Attending: Internal Medicine

## 2018-01-17 ENCOUNTER — Encounter (HOSPITAL_COMMUNITY): Payer: Self-pay | Admitting: *Deleted

## 2018-01-17 DIAGNOSIS — Z87891 Personal history of nicotine dependence: Secondary | ICD-10-CM | POA: Insufficient documentation

## 2018-01-17 DIAGNOSIS — Z79899 Other long term (current) drug therapy: Secondary | ICD-10-CM | POA: Diagnosis not present

## 2018-01-17 DIAGNOSIS — E039 Hypothyroidism, unspecified: Secondary | ICD-10-CM | POA: Insufficient documentation

## 2018-01-17 DIAGNOSIS — Z1211 Encounter for screening for malignant neoplasm of colon: Secondary | ICD-10-CM | POA: Diagnosis not present

## 2018-01-17 DIAGNOSIS — Z87442 Personal history of urinary calculi: Secondary | ICD-10-CM | POA: Diagnosis not present

## 2018-01-17 DIAGNOSIS — K633 Ulcer of intestine: Secondary | ICD-10-CM | POA: Diagnosis not present

## 2018-01-17 DIAGNOSIS — K5730 Diverticulosis of large intestine without perforation or abscess without bleeding: Secondary | ICD-10-CM | POA: Insufficient documentation

## 2018-01-17 DIAGNOSIS — I10 Essential (primary) hypertension: Secondary | ICD-10-CM | POA: Insufficient documentation

## 2018-01-17 DIAGNOSIS — K6389 Other specified diseases of intestine: Secondary | ICD-10-CM | POA: Diagnosis not present

## 2018-01-17 HISTORY — PX: COLONOSCOPY: SHX5424

## 2018-01-17 HISTORY — PX: BIOPSY: SHX5522

## 2018-01-17 SURGERY — COLONOSCOPY
Anesthesia: Moderate Sedation

## 2018-01-17 MED ORDER — MIDAZOLAM HCL 5 MG/5ML IJ SOLN
INTRAMUSCULAR | Status: DC | PRN
  Administered 2018-01-17 (×2): 2 mg via INTRAVENOUS

## 2018-01-17 MED ORDER — MIDAZOLAM HCL 5 MG/5ML IJ SOLN
INTRAMUSCULAR | Status: AC
  Filled 2018-01-17: qty 10

## 2018-01-17 MED ORDER — STERILE WATER FOR IRRIGATION IR SOLN
Status: DC | PRN
  Administered 2018-01-17: 10:00:00

## 2018-01-17 MED ORDER — MEPERIDINE HCL 50 MG/ML IJ SOLN
INTRAMUSCULAR | Status: AC
  Filled 2018-01-17: qty 1

## 2018-01-17 MED ORDER — MEPERIDINE HCL 50 MG/ML IJ SOLN
INTRAMUSCULAR | Status: DC | PRN
  Administered 2018-01-17 (×2): 25 mg via INTRAVENOUS

## 2018-01-17 MED ORDER — SODIUM CHLORIDE 0.9 % IV SOLN
INTRAVENOUS | Status: DC
  Administered 2018-01-17: 1000 mL via INTRAVENOUS

## 2018-01-17 NOTE — Op Note (Signed)
[facility] Patient Name: Susan Mcneil Procedure Date: 01/17/2018 9:30 AM MRN: [id number] Date of Birth: 05/26/1945 Attending MD: [name] , MD CSN: [id number] Age: 72 Admit Type: Outpatient Procedure:                Colonoscopy Indications:              Screening for colorectal malignant neoplasm Providers:                [name], MD, Otis Peak B. Sharon Seller, RN, Hinton Rao, RN Referring MD:             Elayne Snare. Wolfgang Phoenix, MD Medicines:                Meperidine 50 mg IV, Midazolam 4 mg IV Complications:            No immediate complications. Estimated Blood Loss:     Estimated blood loss was minimal. Procedure:                Pre-Anesthesia Assessment:                           - Prior to the procedure, a History and Physical                            was performed, and patient medications and                            allergies were reviewed. The patient's tolerance of                            previous anesthesia was also reviewed. The risks                            and benefits of the procedure and the sedation                            options and risks were discussed with the patient.                            All questions were answered, and informed consent                            was obtained. Prior Anticoagulants: The patient                            last took naproxen 1 day prior to the procedure.                            ASA Grade Assessment: II - A patient with mild                            systemic disease. After reviewing the risks and  benefits, the patient was deemed in satisfactory                            condition to undergo the procedure.                           After obtaining informed consent, the colonoscope                            was passed under direct vision. Throughout the                            procedure, the patient's blood pressure, pulse, and          oxygen saturations were monitored continuously. The                            [id number]-H190DL ([id number]) scope was introduced through                            the anus and advanced to the the terminal ileum,                            with identification of the appendiceal orifice and                            IC valve. The colonoscopy was performed without                            difficulty. The patient tolerated the procedure                            well. The quality of the bowel preparation was                            excellent. The terminal ileum, ileocecal valve,                            appendiceal orifice, and rectum were photographed. Scope In: 9:47:12 AM Scope Out: 10:03:20 AM Scope Withdrawal Time: 0 hours 11 minutes 34 seconds  Total Procedure Duration: 0 hours 16 minutes 8 seconds  Findings:      The perianal and digital rectal examinations were normal.      The terminal ileum appeared normal.      Three erosions were found at the ileocecal valve. Biopsies were taken       with a cold forceps for histology.      A single small-mouthed diverticulum was found in the cecum.      Scattered medium-mouthed diverticula were found in the sigmoid colon.      A localized area of mildly erythematous mucosa was found in the distal       sigmoid colon.      The exam was otherwise normal throughout the examined colon.      The retroflexed view of the distal rectum and anal verge was normal       except thickened anoderm.. Impression:               -  The examined portion of the ileum was normal.                           - Three erosions at the ileocecal valve. Biopsied.                           - Diverticulosis in the cecum.                           - Diverticulosis in the sigmoid colon.                           - Erythematous mucosa in the distal sigmoid colon. Moderate Sedation:      Moderate (conscious) sedation was administered by the endoscopy nurse       and  supervised by the endoscopist. The following parameters were       monitored: oxygen saturation, heart rate, blood pressure, CO2       capnography and response to care. Total physician intraservice time was       21 minutes. Recommendation:           - Patient has a contact number available for                            emergencies. The signs and symptoms of potential                            delayed complications were discussed with the                            patient. Return to normal activities tomorrow.                            Written discharge instructions were provided to the                            patient.                           - High fiber diet today.                           - Continue present medications.                           - No aspirin, ibuprofen, naproxen, or other                            non-steroidal anti-inflammatory drugs for 3 days.                           - Await pathology results.                           - No repeat colonoscopy due to age and the absence  of advanced adenomas. Procedure Code(s):        --- Professional ---                           [phone], Colonoscopy, flexible; with biopsy, single                            or multiple                           G0500, Moderate sedation services provided by the                            same physician or other qualified health care                            professional performing a gastrointestinal                            endoscopic service that sedation supports,                            requiring the presence of an independent trained                            observer to assist in the monitoring of the                            patient's level of consciousness and physiological                            status; initial 15 minutes of intra-service time;                            patient age 73 years or older (additional time may                             be reported with [phone], as appropriate) Diagnosis Code(s):        --- Professional ---                           Z12.11, Encounter for screening for malignant                            neoplasm of colon                           K63.3, Ulcer of intestine                           K63.89, Other specified diseases of intestine                           K57.30, Diverticulosis of large intestine without  perforation or abscess without bleeding CPT copyright 2017 American Medical Association. All rights reserved. The codes documented in this report are preliminary and upon coder review may  be revised to meet current compliance requirements. [name], MD [name], MD 01/17/2018 10:13:21 AM This report has been signed electronically. Number of Addenda: 0

## 2018-01-17 NOTE — Discharge Instructions (Signed)
Diverticulosis Diverticulosis is a condition that develops when small pouches (diverticula) form in the wall of the large intestine (colon). The colon is where water is absorbed and stool is formed. The pouches form when the inside layer of the colon pushes through weak spots in the outer layers of the colon. You may have a few pouches or many of them. What are the causes? The cause of this condition is not known. What increases the risk? The following factors may make you more likely to develop this condition:  Being older than age 27. Your risk for this condition increases with age. Diverticulosis is rare among people younger than age 36. By age 28, many people have it.  Eating a low-fiber diet.  Having frequent constipation.  Being overweight.  Not getting enough exercise.  Smoking.  Taking over-the-counter pain medicines, like aspirin and ibuprofen.  Having a family history of diverticulosis.  What are the signs or symptoms? In most people, there are no symptoms of this condition. If you do have symptoms, they may include:  Bloating.  Cramps in the abdomen.  Constipation or diarrhea.  Pain in the lower left side of the abdomen.  How is this diagnosed? This condition is most often diagnosed during an exam for other colon problems. Because diverticulosis usually has no symptoms, it often cannot be diagnosed independently. This condition may be diagnosed by:  Using a flexible scope to examine the colon (colonoscopy).  Taking an X-ray of the colon after dye has been put into the colon (barium enema).  Doing a CT scan.  How is this treated? You may not need treatment for this condition if you have never developed an infection related to diverticulosis. If you have had an infection before, treatment may include:  Eating a high-fiber diet. This may include eating more fruits, vegetables, and grains.  Taking a fiber supplement.  Taking a live bacteria supplement  (probiotic).  Taking medicine to relax your colon.  Taking antibiotic medicines.  Follow these instructions at home:  Drink 6-8 glasses of water or more each day to prevent constipation.  Try not to strain when you have a bowel movement.  If you have had an infection before: ? Eat more fiber as directed by your health care provider or your diet and nutrition specialist (dietitian). ? Take a fiber supplement or probiotic, if your health care provider approves.  Take over-the-counter and prescription medicines only as told by your health care provider.  If you were prescribed an antibiotic, take it as told by your health care provider. Do not stop taking the antibiotic even if you start to feel better.  Keep all follow-up visits as told by your health care provider. This is important. Contact a health care provider if:  You have pain in your abdomen.  You have bloating.  You have cramps.  You have not had a bowel movement in 3 days. Get help right away if:  Your pain gets worse.  Your bloating becomes very bad.  You have a fever or chills, and your symptoms suddenly get worse.  You vomit.  You have bowel movements that are bloody or black.  You have bleeding from your rectum. Summary  Diverticulosis is a condition that develops when small pouches (diverticula) form in the wall of the large intestine (colon).  You may have a few pouches or many of them.  This condition is most often diagnosed during an exam for other colon problems.  If you have had an  infection related to diverticulosis, treatment may include increasing the fiber in your diet, taking supplements, or taking medicines. This information is not intended to replace advice given to you by your health care provider. Make sure you discuss any questions you have with your health care provider. Document Released: 01/21/2004 Document Revised: 03/14/2016 Document Reviewed: 03/14/2016 Elsevier Interactive  Patient Education  2017 [place]. Colonoscopy, Adult, Care After This sheet gives you information about how to care for yourself after your procedure. Your health care provider may also give you more specific instructions. If you have problems or questions, contact your health care provider. What can I expect after the procedure? After the procedure, it is common to have:  A small amount of blood in your stool for 24 hours after the procedure.  Some gas.  Mild abdominal cramping or bloating.  Follow these instructions at home: General instructions   For the first 24 hours after the procedure: ? Do not drive or use machinery. ? Do not sign important documents. ? Do not drink alcohol. ? Do your regular daily activities at a slower pace than normal. ? Eat soft, easy-to-digest foods. ? Rest often.  Take over-the-counter or prescription medicines only as told by your health care provider.  It is up to you to get the results of your procedure. Ask your health care provider, or the department performing the procedure, when your results will be ready. Relieving cramping and bloating  Try walking around when you have cramps or feel bloated.  Apply heat to your abdomen as told by your health care provider. Use a heat source that your health care provider recommends, such as a moist heat pack or a heating pad. ? Place a towel between your skin and the heat source. ? Leave the heat on for 20-30 minutes. ? Remove the heat if your skin turns bright red. This is especially important if you are unable to feel pain, heat, or cold. You may have a greater risk of getting burned. Eating and drinking  Drink enough fluid to keep your urine clear or pale yellow.  Resume your normal diet as instructed by your health care provider. Avoid heavy or fried foods that are hard to digest.  Avoid drinking alcohol for as long as instructed by your health care provider. Contact a health care provider  if:  You have blood in your stool 2-3 days after the procedure. Get help right away if:  You have more than a small spotting of blood in your stool.  You pass large blood clots in your stool.  Your abdomen is swollen.  You have nausea or vomiting.  You have a fever.  You have increasing abdominal pain that is not relieved with medicine. This information is not intended to replace advice given to you by your health care provider. Make sure you discuss any questions you have with your health care provider. Document Released: 12/08/2003 Document Revised: 01/18/2016 Document Reviewed: 07/07/2015 Elsevier Interactive Patient Education  2018 [employer]. No aspirin for 24 hours. Keep NSAID use to minimum.  Can take Tylenol up to 2 g a day on as-needed basis. Resume usual medications. High-fiber diet. No driving for 24 hours. Physician will call with biopsy results.

## 2018-01-17 NOTE — H&P (Addendum)
Susan Mcneil is an 72 y.o. female.   Chief Complaint: Patient is here for colonoscopy. HPI: Patient is 72 year old Caucasian female who is here for screening colonoscopy.  She denies abdominal pain change in bowel habits or rectal bleeding.  Last colonoscopy was normal in 2009. Family history is negative for CRC.  Past Medical History:  Diagnosis Date  . 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 Fatty liver   . HTN (hypertension)    cholesterol & thyroid  . Hypothyroidism   . Kidney stone   . Macular degeneration   . Macular degeneration, bilateral   . Memory disturbance   . Osteopenia   . Osteoporosis   . Reflux gastritis   . Thyroid disorder     Past Surgical History:  Procedure Laterality Date  . ABDOMINAL HYSTERECTOMY   patial, pre-uterine cancer, 1974  . BIOPSY  10/27/2016   Procedure: BIOPSY;  Surgeon: Rogene Houston, MD;  Location: [facility];  Service: Endoscopy;;  esophagus  . COLONOSCOPY N/A 04/01/2015   Procedure: COLONOSCOPY;  Surgeon: Rogene Houston, MD;  Location: [facility];  Service: Endoscopy;  Laterality: N/A;  1030  . DIAGNOSTIC LAPAROSCOPY    . ESOPHAGOGASTRODUODENOSCOPY N/A 10/27/2016   Procedure: ESOPHAGOGASTRODUODENOSCOPY (EGD);  Surgeon: Rogene Houston, MD;  Location: [facility];  Service: Endoscopy;  Laterality: N/A;  100  . TOTAL ABDOMINAL HYSTERECTOMY W/ BILATERAL SALPINGOOPHORECTOMY      Family History  Problem Relation Age of Onset  . Cancer Mother   . Stroke Father    Social History:  reports that she quit smoking about 17 years ago. She has never used smokeless tobacco. She reports that she does not drink alcohol or use drugs.  Allergies: No Known Allergies  Medications Prior to Admission  Medication Sig Dispense Refill  . Calcium Citrate (CITRACAL PO) Take 1 tablet by mouth  daily.     . Coenzyme Q10 (COQ10 PO) Take 1 capsule by mouth daily.    Marland Kitchen donepezil (ARICEPT) 5 MG tablet Take one tab daily (Patient taking differently: Take 5 mg by mouth daily. ) 30 tablet 4  . famotidine (PEPCID) 40 MG tablet Take 1 tablet (40 mg total) by mouth at bedtime. 90 tablet 3  . levothyroxine (SYNTHROID, LEVOTHROID) 150 MCG tablet Take 150 mcg by mouth daily before breakfast.    . lisinopril (PRINIVIL,ZESTRIL) 20 MG tablet Take 1 tablet (20 mg total) by mouth daily. 30 tablet 5  . rosuvastatin (CRESTOR) 10 MG tablet Take 1 tablet (10 mg total) by mouth at bedtime. 30 tablet 5  . Travoprost, BAK Free, (TRAVATAN) 0.004 % SOLN ophthalmic solution Place 1 drop into the right eye at bedtime.       No results found for this or any previous visit (from the past 48 hour(s)). No results found.  ROS  Blood pressure (!) 124/57, pulse 89, temperature (!) 96.3 F (35.7 C), temperature source Axillary, resp. rate 16, height 5\' 4"  (1.626 m), weight 66.2 kg, SpO2 96 %. Physical Exam  Constitutional: She appears well-developed and well-nourished.  HENT:  Mouth/Throat: Oropharynx is clear and moist.  Eyes: Conjunctivae are normal. No scleral icterus.  Neck: No thyromegaly present.  Cardiovascular: Normal rate, regular rhythm and normal heart sounds.  No murmur heard. Respiratory: Effort normal and breath sounds normal.  GI: Soft. She exhibits  no distension and no mass. There is no tenderness.  Musculoskeletal: She exhibits no edema.  Lymphadenopathy:    She has no cervical adenopathy.  Neurological: She is alert.  Skin: Skin is warm and dry.     Assessment/Plan Average risk screening colonoscopy.  [name], MD 01/17/2018, 9:38 AM

## 2018-01-20 ENCOUNTER — Other Ambulatory Visit (INDEPENDENT_AMBULATORY_CARE_PROVIDER_SITE_OTHER): Payer: Self-pay | Admitting: Internal Medicine

## 2018-01-20 ENCOUNTER — Other Ambulatory Visit: Payer: Self-pay | Admitting: Family Medicine

## 2018-01-23 ENCOUNTER — Encounter (HOSPITAL_COMMUNITY): Payer: Self-pay | Admitting: Internal Medicine

## 2018-02-05 ENCOUNTER — Ambulatory Visit: Payer: Medicare Other | Admitting: Family Medicine

## 2018-02-05 DIAGNOSIS — H35351 Cystoid macular degeneration, right eye: Secondary | ICD-10-CM | POA: Diagnosis not present

## 2018-02-05 DIAGNOSIS — H353211 Exudative age-related macular degeneration, right eye, with active choroidal neovascularization: Secondary | ICD-10-CM | POA: Diagnosis not present

## 2018-02-05 DIAGNOSIS — H4051X3 Glaucoma secondary to other eye disorders, right eye, severe stage: Secondary | ICD-10-CM | POA: Diagnosis not present

## 2018-02-15 ENCOUNTER — Ambulatory Visit: Payer: Medicare Other

## 2018-02-27 ENCOUNTER — Ambulatory Visit: Payer: Medicare Other

## 2018-03-12 DIAGNOSIS — H353211 Exudative age-related macular degeneration, right eye, with active choroidal neovascularization: Secondary | ICD-10-CM | POA: Diagnosis not present

## 2018-03-12 DIAGNOSIS — H353112 Nonexudative age-related macular degeneration, right eye, intermediate dry stage: Secondary | ICD-10-CM | POA: Diagnosis not present

## 2018-03-12 DIAGNOSIS — H35351 Cystoid macular degeneration, right eye: Secondary | ICD-10-CM | POA: Diagnosis not present

## 2018-03-12 DIAGNOSIS — H353122 Nonexudative age-related macular degeneration, left eye, intermediate dry stage: Secondary | ICD-10-CM | POA: Diagnosis not present

## 2018-03-14 ENCOUNTER — Ambulatory Visit: Payer: Medicare Other

## 2018-03-16 DIAGNOSIS — E038 Other specified hypothyroidism: Secondary | ICD-10-CM | POA: Diagnosis not present

## 2018-03-17 LAB — TSH: TSH: <0.006 u[IU]/mL — ABNORMAL LOW (ref 0.450–4.500)

## 2018-03-19 ENCOUNTER — Other Ambulatory Visit: Payer: Self-pay

## 2018-03-20 ENCOUNTER — Ambulatory Visit (INDEPENDENT_AMBULATORY_CARE_PROVIDER_SITE_OTHER): Payer: Medicare Other | Admitting: *Deleted

## 2018-03-20 DIAGNOSIS — E538 Deficiency of other specified B group vitamins: Secondary | ICD-10-CM | POA: Diagnosis not present

## 2018-03-20 MED ORDER — CYANOCOBALAMIN 1000 MCG/ML IJ SOLN
1000.0000 ug | Freq: Once | INTRAMUSCULAR | Status: AC
  Administered 2018-03-20: 1000 ug via INTRAMUSCULAR

## 2018-03-22 MED ORDER — LEVOTHYROXINE SODIUM 150 MCG PO TABS: ORAL_TABLET | ORAL | 0 refills | Status: DC

## 2018-03-22 NOTE — Addendum Note (Signed)
Addended by: Dairl Ponder on: 03/22/2018 10:49 AM   Modules accepted: Orders

## 2018-04-16 DIAGNOSIS — H26492 Other secondary cataract, left eye: Secondary | ICD-10-CM | POA: Diagnosis not present

## 2018-04-16 DIAGNOSIS — H35351 Cystoid macular degeneration, right eye: Secondary | ICD-10-CM | POA: Diagnosis not present

## 2018-04-16 DIAGNOSIS — H353211 Exudative age-related macular degeneration, right eye, with active choroidal neovascularization: Secondary | ICD-10-CM | POA: Diagnosis not present

## 2018-04-16 DIAGNOSIS — H353122 Nonexudative age-related macular degeneration, left eye, intermediate dry stage: Secondary | ICD-10-CM | POA: Diagnosis not present

## 2018-04-16 DIAGNOSIS — H353112 Nonexudative age-related macular degeneration, right eye, intermediate dry stage: Secondary | ICD-10-CM | POA: Diagnosis not present

## 2018-04-19 ENCOUNTER — Ambulatory Visit (INDEPENDENT_AMBULATORY_CARE_PROVIDER_SITE_OTHER): Payer: Medicare Other | Admitting: *Deleted

## 2018-04-19 DIAGNOSIS — E538 Deficiency of other specified B group vitamins: Secondary | ICD-10-CM

## 2018-04-19 MED ORDER — CYANOCOBALAMIN 1000 MCG/ML IJ SOLN
1000.0000 ug | Freq: Once | INTRAMUSCULAR | Status: AC
  Administered 2018-04-19: 1000 ug via INTRAMUSCULAR

## 2018-05-04 ENCOUNTER — Other Ambulatory Visit: Payer: Self-pay | Admitting: Family Medicine

## 2018-05-04 NOTE — Telephone Encounter (Signed)
May have 3 refills will need follow-up office visit

## 2018-05-14 DIAGNOSIS — H401131 Primary open-angle glaucoma, bilateral, mild stage: Secondary | ICD-10-CM | POA: Diagnosis not present

## 2018-05-14 DIAGNOSIS — H353210 Exudative age-related macular degeneration, right eye, stage unspecified: Secondary | ICD-10-CM | POA: Diagnosis not present

## 2018-05-20 ENCOUNTER — Other Ambulatory Visit: Payer: Self-pay | Admitting: Family Medicine

## 2018-05-21 ENCOUNTER — Other Ambulatory Visit: Payer: Self-pay | Admitting: Family Medicine

## 2018-05-21 DIAGNOSIS — H35351 Cystoid macular degeneration, right eye: Secondary | ICD-10-CM | POA: Diagnosis not present

## 2018-05-21 DIAGNOSIS — H353211 Exudative age-related macular degeneration, right eye, with active choroidal neovascularization: Secondary | ICD-10-CM | POA: Diagnosis not present

## 2018-05-21 DIAGNOSIS — H353112 Nonexudative age-related macular degeneration, right eye, intermediate dry stage: Secondary | ICD-10-CM | POA: Diagnosis not present

## 2018-05-21 DIAGNOSIS — H353122 Nonexudative age-related macular degeneration, left eye, intermediate dry stage: Secondary | ICD-10-CM | POA: Diagnosis not present

## 2018-05-24 ENCOUNTER — Ambulatory Visit: Payer: Medicare Other

## 2018-05-31 ENCOUNTER — Ambulatory Visit: Payer: Medicare Other

## 2018-06-22 ENCOUNTER — Ambulatory Visit (INDEPENDENT_AMBULATORY_CARE_PROVIDER_SITE_OTHER): Payer: Medicare Other

## 2018-06-22 DIAGNOSIS — E538 Deficiency of other specified B group vitamins: Secondary | ICD-10-CM | POA: Diagnosis not present

## 2018-06-22 DIAGNOSIS — E038 Other specified hypothyroidism: Secondary | ICD-10-CM | POA: Diagnosis not present

## 2018-06-22 MED ORDER — CYANOCOBALAMIN 1000 MCG/ML IJ SOLN: 1000.0000 ug | Freq: Once | INTRAMUSCULAR | 0 refills | Status: DC

## 2018-06-22 MED ORDER — CYANOCOBALAMIN 1000 MCG/ML IJ SOLN
1000.0000 ug | Freq: Once | INTRAMUSCULAR | Status: AC
  Administered 2018-06-22: 1000 ug via INTRAMUSCULAR

## 2018-06-23 LAB — TSH: TSH: <0.006 u[IU]/mL — ABNORMAL LOW (ref 0.450–4.500)

## 2018-06-25 ENCOUNTER — Other Ambulatory Visit: Payer: Self-pay | Admitting: Family Medicine

## 2018-06-25 DIAGNOSIS — E038 Other specified hypothyroidism: Secondary | ICD-10-CM

## 2018-06-25 MED ORDER — LEVOTHYROXINE SODIUM 125 MCG PO TABS: 125.0000 ug | ORAL_TABLET | Freq: Every day | ORAL | 1 refills | Status: DC

## 2018-07-04 DIAGNOSIS — H35351 Cystoid macular degeneration, right eye: Secondary | ICD-10-CM | POA: Diagnosis not present

## 2018-07-04 DIAGNOSIS — H43811 Vitreous degeneration, right eye: Secondary | ICD-10-CM | POA: Diagnosis not present

## 2018-07-04 DIAGNOSIS — H353211 Exudative age-related macular degeneration, right eye, with active choroidal neovascularization: Secondary | ICD-10-CM | POA: Diagnosis not present

## 2018-07-26 ENCOUNTER — Ambulatory Visit (INDEPENDENT_AMBULATORY_CARE_PROVIDER_SITE_OTHER): Payer: Medicare Other | Admitting: *Deleted

## 2018-07-26 ENCOUNTER — Other Ambulatory Visit: Payer: Self-pay

## 2018-07-26 DIAGNOSIS — E538 Deficiency of other specified B group vitamins: Secondary | ICD-10-CM

## 2018-07-26 DIAGNOSIS — Z23 Encounter for immunization: Secondary | ICD-10-CM | POA: Diagnosis not present

## 2018-07-26 MED ORDER — CYANOCOBALAMIN 1000 MCG/ML IJ SOLN
1000.0000 ug | Freq: Once | INTRAMUSCULAR | Status: AC
  Administered 2018-07-26: 1000 ug via INTRAMUSCULAR

## 2018-08-02 DIAGNOSIS — H353112 Nonexudative age-related macular degeneration, right eye, intermediate dry stage: Secondary | ICD-10-CM | POA: Diagnosis not present

## 2018-08-02 DIAGNOSIS — H353211 Exudative age-related macular degeneration, right eye, with active choroidal neovascularization: Secondary | ICD-10-CM | POA: Diagnosis not present

## 2018-08-27 ENCOUNTER — Other Ambulatory Visit: Payer: Self-pay | Admitting: Family Medicine

## 2018-08-30 ENCOUNTER — Other Ambulatory Visit: Payer: Self-pay | Admitting: *Deleted

## 2018-08-30 ENCOUNTER — Other Ambulatory Visit: Payer: Self-pay

## 2018-08-30 ENCOUNTER — Ambulatory Visit (INDEPENDENT_AMBULATORY_CARE_PROVIDER_SITE_OTHER): Payer: Medicare Other | Admitting: Family Medicine

## 2018-08-30 VITALS — BP 124/74

## 2018-08-30 DIAGNOSIS — R413 Other amnesia: Secondary | ICD-10-CM

## 2018-08-30 DIAGNOSIS — E785 Hyperlipidemia, unspecified: Secondary | ICD-10-CM

## 2018-08-30 DIAGNOSIS — E538 Deficiency of other specified B group vitamins: Secondary | ICD-10-CM

## 2018-08-30 DIAGNOSIS — I10 Essential (primary) hypertension: Secondary | ICD-10-CM

## 2018-08-30 DIAGNOSIS — E039 Hypothyroidism, unspecified: Secondary | ICD-10-CM | POA: Diagnosis not present

## 2018-08-30 DIAGNOSIS — Z79899 Other long term (current) drug therapy: Secondary | ICD-10-CM | POA: Diagnosis not present

## 2018-08-30 MED ORDER — CYANOCOBALAMIN 1000 MCG/ML IJ SOLN
1000.0000 ug | Freq: Once | INTRAMUSCULAR | Status: AC
  Administered 2018-08-30: 1000 ug via INTRAMUSCULAR

## 2018-08-30 MED ORDER — LEVOTHYROXINE SODIUM 125 MCG PO TABS: 125.0000 ug | ORAL_TABLET | Freq: Every day | ORAL | 1 refills | Status: DC

## 2018-08-30 MED ORDER — DONEPEZIL HCL 5 MG PO TABS: 5.0000 mg | ORAL_TABLET | Freq: Every day | ORAL | 5 refills | Status: DC

## 2018-08-30 MED ORDER — ROSUVASTATIN CALCIUM 10 MG PO TABS: ORAL_TABLET | ORAL | 5 refills | Status: DC

## 2018-08-30 NOTE — Progress Notes (Signed)
  Subjective:     Patient ID: Susan Mcneil, female   DOB: October 20, 1945, 73 y.o.   MRN: [id number]  HPI Patient presents today for follow-up of multiple health issues  Patient here for follow-up regarding cholesterol.  The patient does have hyperlipidemia.  Patient does try to maintain a reasonable diet.  Patient does take the medication on a regular basis.  Denies missing a dose.  The patient denies any obvious side effects.  Prior blood work results reviewed with the patient.  The patient is aware of his cholesterol goals and the need to keep it under good control to lessen the risk of disease.  Patient has thyroid condition.  Takes thyroid medication on a regular basis.  States that the proper way.  Relates compliance.  States no negative side effects.  States condition seems to be under good control.  B12 deficiency she takes her shot on a regular basis here today for her shot  Mild cognitive impairment takes her Aricept on a regular basis denies any problems husband states she is overall been doing well  Review of Systems     Objective:   Physical Exam Head and neck no abnormal masses are detected no tracheal deviation eyes appear normal lungs are clear no crackles respiratory rate normal heart regular no murmurs extremities no edema skin warm dry    Assessment:     Hypothyroidism Hyperlipidemia Cognitive dysfunction   B12 deficiency due to B12 shot monthly Plan:     Patient does take her Aricept on a regular basis stable does well with her husband's help I have recommended for the patient not to drive husband does the driving Thyroid takes her medicine every single day she will check lab work by early June B12 deficiency continue monthly shots Hyperlipidemia continue current medication Healthy diet regular physical activity recommended Previous labs reviewed new labs ordered Hold off on mammogram currently.

## 2018-08-30 NOTE — Progress Notes (Signed)
Pharm states she was on 158mcg at one time but it was decreased by dr and the last refill they picked up was 152mcg and I called husband to let him know and he states yes he looked at her bottle and it was 18mcg . Refills sent to pharm.

## 2018-09-12 DIAGNOSIS — H40003 Preglaucoma, unspecified, bilateral: Secondary | ICD-10-CM | POA: Diagnosis not present

## 2018-09-12 DIAGNOSIS — H353211 Exudative age-related macular degeneration, right eye, with active choroidal neovascularization: Secondary | ICD-10-CM | POA: Diagnosis not present

## 2018-09-12 DIAGNOSIS — H43813 Vitreous degeneration, bilateral: Secondary | ICD-10-CM | POA: Diagnosis not present

## 2018-09-12 DIAGNOSIS — H35351 Cystoid macular degeneration, right eye: Secondary | ICD-10-CM | POA: Diagnosis not present

## 2018-09-12 DIAGNOSIS — H43811 Vitreous degeneration, right eye: Secondary | ICD-10-CM | POA: Diagnosis not present

## 2018-10-04 ENCOUNTER — Ambulatory Visit (INDEPENDENT_AMBULATORY_CARE_PROVIDER_SITE_OTHER): Payer: Medicare Other

## 2018-10-04 ENCOUNTER — Other Ambulatory Visit: Payer: Self-pay

## 2018-10-04 DIAGNOSIS — E538 Deficiency of other specified B group vitamins: Secondary | ICD-10-CM

## 2018-10-04 MED ORDER — CYANOCOBALAMIN 1000 MCG/ML IJ SOLN
1000.0000 ug | Freq: Once | INTRAMUSCULAR | Status: AC
  Administered 2018-10-04: 1000 ug via INTRAMUSCULAR

## 2018-10-24 DIAGNOSIS — H43811 Vitreous degeneration, right eye: Secondary | ICD-10-CM | POA: Diagnosis not present

## 2018-10-24 DIAGNOSIS — H4051X3 Glaucoma secondary to other eye disorders, right eye, severe stage: Secondary | ICD-10-CM | POA: Diagnosis not present

## 2018-10-24 DIAGNOSIS — H353211 Exudative age-related macular degeneration, right eye, with active choroidal neovascularization: Secondary | ICD-10-CM | POA: Diagnosis not present

## 2018-10-24 DIAGNOSIS — H35351 Cystoid macular degeneration, right eye: Secondary | ICD-10-CM | POA: Diagnosis not present

## 2018-11-01 ENCOUNTER — Ambulatory Visit (INDEPENDENT_AMBULATORY_CARE_PROVIDER_SITE_OTHER): Payer: Medicare Other | Admitting: *Deleted

## 2018-11-01 ENCOUNTER — Other Ambulatory Visit: Payer: Self-pay

## 2018-11-01 DIAGNOSIS — E538 Deficiency of other specified B group vitamins: Secondary | ICD-10-CM | POA: Diagnosis not present

## 2018-11-01 MED ORDER — CYANOCOBALAMIN 1000 MCG/ML IJ SOLN
1000.0000 ug | Freq: Once | INTRAMUSCULAR | Status: AC
  Administered 2018-11-01: 1000 ug via INTRAMUSCULAR

## 2018-11-12 DIAGNOSIS — H401131 Primary open-angle glaucoma, bilateral, mild stage: Secondary | ICD-10-CM | POA: Diagnosis not present

## 2018-11-12 DIAGNOSIS — H353210 Exudative age-related macular degeneration, right eye, stage unspecified: Secondary | ICD-10-CM | POA: Diagnosis not present

## 2018-11-12 DIAGNOSIS — H353130 Nonexudative age-related macular degeneration, bilateral, stage unspecified: Secondary | ICD-10-CM | POA: Diagnosis not present

## 2018-11-29 ENCOUNTER — Ambulatory Visit (INDEPENDENT_AMBULATORY_CARE_PROVIDER_SITE_OTHER): Payer: Medicare Other

## 2018-11-29 ENCOUNTER — Other Ambulatory Visit: Payer: Self-pay

## 2018-11-29 DIAGNOSIS — E538 Deficiency of other specified B group vitamins: Secondary | ICD-10-CM

## 2018-11-29 MED ORDER — CYANOCOBALAMIN 1000 MCG/ML IJ SOLN
1000.0000 ug | Freq: Once | INTRAMUSCULAR | Status: AC
  Administered 2018-11-29: 1000 ug via INTRAMUSCULAR

## 2018-12-05 DIAGNOSIS — H43813 Vitreous degeneration, bilateral: Secondary | ICD-10-CM | POA: Diagnosis not present

## 2018-12-05 DIAGNOSIS — H353112 Nonexudative age-related macular degeneration, right eye, intermediate dry stage: Secondary | ICD-10-CM | POA: Diagnosis not present

## 2018-12-05 DIAGNOSIS — H35351 Cystoid macular degeneration, right eye: Secondary | ICD-10-CM | POA: Diagnosis not present

## 2018-12-05 DIAGNOSIS — H353211 Exudative age-related macular degeneration, right eye, with active choroidal neovascularization: Secondary | ICD-10-CM | POA: Diagnosis not present

## 2018-12-06 ENCOUNTER — Other Ambulatory Visit: Payer: Self-pay

## 2018-12-27 ENCOUNTER — Other Ambulatory Visit: Payer: Self-pay | Admitting: Family Medicine

## 2019-01-02 ENCOUNTER — Ambulatory Visit: Payer: Medicare Other

## 2019-01-03 ENCOUNTER — Encounter (INDEPENDENT_AMBULATORY_CARE_PROVIDER_SITE_OTHER): Payer: Self-pay

## 2019-01-03 ENCOUNTER — Other Ambulatory Visit: Payer: Self-pay

## 2019-01-03 ENCOUNTER — Ambulatory Visit (INDEPENDENT_AMBULATORY_CARE_PROVIDER_SITE_OTHER): Payer: Medicare Other | Admitting: *Deleted

## 2019-01-03 DIAGNOSIS — E538 Deficiency of other specified B group vitamins: Secondary | ICD-10-CM | POA: Diagnosis not present

## 2019-01-03 MED ORDER — CYANOCOBALAMIN 1000 MCG/ML IJ SOLN
1000.0000 ug | Freq: Once | INTRAMUSCULAR | Status: AC
  Administered 2019-01-03: 1000 ug via INTRAMUSCULAR

## 2019-01-17 DIAGNOSIS — H353122 Nonexudative age-related macular degeneration, left eye, intermediate dry stage: Secondary | ICD-10-CM | POA: Diagnosis not present

## 2019-01-17 DIAGNOSIS — H43811 Vitreous degeneration, right eye: Secondary | ICD-10-CM | POA: Diagnosis not present

## 2019-01-17 DIAGNOSIS — H35351 Cystoid macular degeneration, right eye: Secondary | ICD-10-CM | POA: Diagnosis not present

## 2019-01-17 DIAGNOSIS — H353211 Exudative age-related macular degeneration, right eye, with active choroidal neovascularization: Secondary | ICD-10-CM | POA: Diagnosis not present

## 2019-02-05 ENCOUNTER — Other Ambulatory Visit: Payer: Medicare Other

## 2019-02-06 ENCOUNTER — Other Ambulatory Visit: Payer: Medicare Other

## 2019-03-05 ENCOUNTER — Other Ambulatory Visit: Payer: Self-pay | Admitting: Family Medicine

## 2019-03-06 NOTE — Telephone Encounter (Signed)
Needs to do a follow-up visit this can be virtual for this fall May have this +1 refill

## 2019-03-06 NOTE — Telephone Encounter (Signed)
[facility]-need to schedule virtual visit this fall (then route message back to the clinical pool)

## 2019-03-11 NOTE — Telephone Encounter (Signed)
Medication has been sent in 

## 2019-03-11 NOTE — Telephone Encounter (Signed)
Appointment schedule 11/17

## 2019-03-11 NOTE — Telephone Encounter (Signed)
Patient made an appt for Nov. 17th but is out of meds and wanted to get a refill today.

## 2019-03-25 DIAGNOSIS — H43811 Vitreous degeneration, right eye: Secondary | ICD-10-CM | POA: Diagnosis not present

## 2019-03-25 DIAGNOSIS — H35351 Cystoid macular degeneration, right eye: Secondary | ICD-10-CM | POA: Diagnosis not present

## 2019-03-25 DIAGNOSIS — H353211 Exudative age-related macular degeneration, right eye, with active choroidal neovascularization: Secondary | ICD-10-CM | POA: Diagnosis not present

## 2019-03-26 ENCOUNTER — Encounter: Payer: Self-pay | Admitting: Family Medicine

## 2019-03-26 ENCOUNTER — Ambulatory Visit (INDEPENDENT_AMBULATORY_CARE_PROVIDER_SITE_OTHER): Payer: Medicare Other | Admitting: Family Medicine

## 2019-03-26 ENCOUNTER — Other Ambulatory Visit: Payer: Self-pay

## 2019-03-26 DIAGNOSIS — I10 Essential (primary) hypertension: Secondary | ICD-10-CM

## 2019-03-26 DIAGNOSIS — E785 Hyperlipidemia, unspecified: Secondary | ICD-10-CM | POA: Diagnosis not present

## 2019-03-26 DIAGNOSIS — E039 Hypothyroidism, unspecified: Secondary | ICD-10-CM

## 2019-03-26 MED ORDER — DONEPEZIL HCL 5 MG PO TABS: ORAL_TABLET | ORAL | 1 refills | Status: DC

## 2019-03-26 MED ORDER — FAMOTIDINE 40 MG PO TABS: ORAL_TABLET | ORAL | 1 refills | Status: DC

## 2019-03-26 MED ORDER — ROSUVASTATIN CALCIUM 10 MG PO TABS: ORAL_TABLET | ORAL | 1 refills | Status: DC

## 2019-03-26 MED ORDER — LEVOTHYROXINE SODIUM 125 MCG PO TABS: 125.0000 ug | ORAL_TABLET | Freq: Every day | ORAL | 1 refills | Status: DC

## 2019-03-26 NOTE — Addendum Note (Signed)
Addended by: Carmelina Noun on: 03/26/2019 01:53 PM   Modules accepted: Orders

## 2019-03-26 NOTE — Progress Notes (Signed)
   Subjective:    Patient ID: Susan Mcneil, female    DOB: [birth year], 73 y.o.   MRN: [id number]  Hyperlipidemia This is a chronic problem. Pertinent negatives include no chest pain or shortness of breath. Treatments tried: rosuvastatin 10mg .  pt states she is doing well and no concerns or problems today.  Patient states that she does take her Aricept regular basis.  States her memory is staying about where it was.  Her husband helps her out a lot She does take her cholesterol medicine regular basis tries to be healthy in her eating does take her thyroid medicine on a regular basis. Virtual Visit via Telephone Note  I connected with Susan Mcneil on 03/26/19 at  1:40 PM EST by telephone and verified that I am speaking with the correct person using two identifiers.  Location: Patient: home Provider: office   I discussed the limitations, risks, security and privacy concerns of performing an evaluation and management service by telephone and the availability of in person appointments. I also discussed with the patient that there may be a patient responsible charge related to this service. The patient expressed understanding and agreed to proceed.   History of Present Illness:    Observations/Objective:   Assessment and Plan:   Follow Up Instructions:    I discussed the assessment and treatment plan with the patient. The patient was provided an opportunity to ask questions and all were answered. The patient agreed with the plan and demonstrated an understanding of the instructions.   The patient was advised to call back or seek an in-person evaluation if the symptoms worsen or if the condition fails to improve as anticipated.  I provided 16 minutes of non-face-to-face time during this encounter.       Review of Systems  Constitutional: Negative for activity change, appetite change and fatigue.  HENT: Negative for congestion and rhinorrhea.   Respiratory: Negative for cough  and shortness of breath.   Cardiovascular: Negative for chest pain and leg swelling.  Gastrointestinal: Negative for abdominal pain and diarrhea.  Endocrine: Negative for polydipsia and polyphagia.  Skin: Negative for color change.  Neurological: Negative for dizziness and weakness.  Psychiatric/Behavioral: Negative for behavioral problems and confusion.       Objective:   Physical Exam Today's visit was via telephone Physical exam was not possible for this visit   Lab work this fall Follow-up for B12 shot as well as flu shot Follow-up in the spring     Assessment & Plan:  Cognitive dysfunction/probable early Alzheimer's-Aricept 5 mg daily follow-up in the spring  Hyperlipidemia continue medication watch diet closely check lab work  Hypothyroidism continue medication check lab work

## 2019-03-27 ENCOUNTER — Other Ambulatory Visit: Payer: Self-pay | Admitting: Family Medicine

## 2019-03-29 ENCOUNTER — Other Ambulatory Visit: Payer: Medicare Other

## 2019-04-02 ENCOUNTER — Other Ambulatory Visit: Payer: Self-pay

## 2019-04-02 ENCOUNTER — Other Ambulatory Visit (INDEPENDENT_AMBULATORY_CARE_PROVIDER_SITE_OTHER): Payer: Medicare Other

## 2019-04-02 DIAGNOSIS — E785 Hyperlipidemia, unspecified: Secondary | ICD-10-CM | POA: Diagnosis not present

## 2019-04-02 DIAGNOSIS — I10 Essential (primary) hypertension: Secondary | ICD-10-CM | POA: Diagnosis not present

## 2019-04-02 DIAGNOSIS — E039 Hypothyroidism, unspecified: Secondary | ICD-10-CM | POA: Diagnosis not present

## 2019-04-02 DIAGNOSIS — E538 Deficiency of other specified B group vitamins: Secondary | ICD-10-CM

## 2019-04-02 MED ORDER — CYANOCOBALAMIN 1000 MCG/ML IJ SOLN
1000.0000 ug | Freq: Once | INTRAMUSCULAR | Status: AC
  Administered 2019-04-02: 1000 ug via INTRAMUSCULAR

## 2019-04-03 LAB — LIPID PANEL
Chol/HDL Ratio: 2.8 ratio (ref 0.0–4.4)
Cholesterol, Total: 168 mg/dL (ref 100–199)
HDL: 61 mg/dL (ref >39)
LDL Chol Calc (NIH): 70 mg/dL (ref 0–99)
Triglycerides: 230 mg/dL — ABNORMAL HIGH (ref 0–149)
VLDL Cholesterol Cal: 37 mg/dL (ref 5–40)

## 2019-04-03 LAB — COMPREHENSIVE METABOLIC PANEL
ALT: 8 IU/L (ref 0–32)
AST: 16 IU/L (ref 0–40)
Albumin/Globulin Ratio: 1.5 (ref 1.2–2.2)
Albumin: 4.1 g/dL (ref 3.7–4.7)
Alkaline Phosphatase: 107 IU/L (ref 39–117)
BUN/Creatinine Ratio: 26 (ref 12–28)
BUN: 18 mg/dL (ref 8–27)
Bilirubin Total: <0.2 mg/dL (ref 0.0–1.2)
CO2: 26 mmol/L (ref 20–29)
Calcium: 9.2 mg/dL (ref 8.7–10.3)
Chloride: 105 mmol/L (ref 96–106)
Creatinine, Ser: 0.68 mg/dL (ref 0.57–1.00)
GFR calc Af Amer: 100 mL/min/{1.73_m2} (ref >59)
GFR calc non Af Amer: 86 mL/min/{1.73_m2} (ref >59)
Globulin, Total: 2.7 g/dL (ref 1.5–4.5)
Glucose: 119 mg/dL — ABNORMAL HIGH (ref 65–99)
Potassium: 4 mmol/L (ref 3.5–5.2)
Sodium: 143 mmol/L (ref 134–144)
Total Protein: 6.8 g/dL (ref 6.0–8.5)

## 2019-04-03 LAB — TSH: TSH: 0.009 u[IU]/mL — ABNORMAL LOW (ref 0.450–4.500)

## 2019-04-08 ENCOUNTER — Other Ambulatory Visit: Payer: Self-pay | Admitting: Family Medicine

## 2019-04-12 ENCOUNTER — Other Ambulatory Visit: Payer: Self-pay | Admitting: Family Medicine

## 2019-04-18 MED ORDER — LEVOTHYROXINE SODIUM 112 MCG PO TABS: ORAL_TABLET | ORAL | 1 refills | Status: DC

## 2019-04-18 NOTE — Addendum Note (Signed)
Addended by: Vicente Males on: 04/18/2019 04:31 PM   Modules accepted: Orders

## 2019-04-30 ENCOUNTER — Other Ambulatory Visit (INDEPENDENT_AMBULATORY_CARE_PROVIDER_SITE_OTHER): Payer: Medicare Other | Admitting: *Deleted

## 2019-04-30 ENCOUNTER — Other Ambulatory Visit: Payer: Self-pay

## 2019-04-30 DIAGNOSIS — E538 Deficiency of other specified B group vitamins: Secondary | ICD-10-CM | POA: Diagnosis not present

## 2019-04-30 MED ORDER — CYANOCOBALAMIN 1000 MCG/ML IJ SOLN
1000.0000 ug | Freq: Once | INTRAMUSCULAR | Status: AC
  Administered 2019-04-30: 1000 ug via INTRAMUSCULAR

## 2019-05-20 DIAGNOSIS — H43811 Vitreous degeneration, right eye: Secondary | ICD-10-CM | POA: Diagnosis not present

## 2019-05-20 DIAGNOSIS — H35351 Cystoid macular degeneration, right eye: Secondary | ICD-10-CM | POA: Diagnosis not present

## 2019-05-20 DIAGNOSIS — H353112 Nonexudative age-related macular degeneration, right eye, intermediate dry stage: Secondary | ICD-10-CM | POA: Diagnosis not present

## 2019-05-20 DIAGNOSIS — H353211 Exudative age-related macular degeneration, right eye, with active choroidal neovascularization: Secondary | ICD-10-CM | POA: Diagnosis not present

## 2019-05-28 ENCOUNTER — Encounter: Payer: Self-pay | Admitting: Urology

## 2019-05-28 ENCOUNTER — Other Ambulatory Visit (INDEPENDENT_AMBULATORY_CARE_PROVIDER_SITE_OTHER): Payer: Medicare Other | Admitting: *Deleted

## 2019-05-28 ENCOUNTER — Other Ambulatory Visit: Payer: Self-pay

## 2019-05-28 DIAGNOSIS — E538 Deficiency of other specified B group vitamins: Secondary | ICD-10-CM | POA: Diagnosis not present

## 2019-05-28 MED ORDER — CYANOCOBALAMIN 1000 MCG/ML IJ SOLN
1000.0000 ug | Freq: Once | INTRAMUSCULAR | Status: AC
  Administered 2019-05-28: 1000 ug via INTRAMUSCULAR

## 2019-06-13 DIAGNOSIS — Z23 Encounter for immunization: Secondary | ICD-10-CM | POA: Diagnosis not present

## 2019-06-17 DIAGNOSIS — E039 Hypothyroidism, unspecified: Secondary | ICD-10-CM | POA: Diagnosis not present

## 2019-06-18 LAB — T4, FREE: Free T4: 1.88 ng/dL — ABNORMAL HIGH (ref 0.82–1.77)

## 2019-06-18 LAB — TSH: TSH: 0.018 u[IU]/mL — ABNORMAL LOW (ref 0.450–4.500)

## 2019-06-20 MED ORDER — LEVOTHYROXINE SODIUM 100 MCG PO TABS: 100.0000 ug | ORAL_TABLET | Freq: Every day | ORAL | 1 refills | Status: DC

## 2019-06-20 NOTE — Addendum Note (Signed)
Addended by: Dairl Ponder on: 06/20/2019 11:22 AM   Modules accepted: Orders

## 2019-06-21 ENCOUNTER — Other Ambulatory Visit: Payer: Self-pay | Admitting: *Deleted

## 2019-06-21 DIAGNOSIS — E039 Hypothyroidism, unspecified: Secondary | ICD-10-CM

## 2019-06-21 DIAGNOSIS — Z23 Encounter for immunization: Secondary | ICD-10-CM | POA: Diagnosis not present

## 2019-06-21 MED ORDER — LEVOTHYROXINE SODIUM 100 MCG PO TABS: 100.0000 ug | ORAL_TABLET | Freq: Every day | ORAL | 2 refills | Status: DC

## 2019-06-28 ENCOUNTER — Other Ambulatory Visit: Payer: Self-pay

## 2019-06-28 ENCOUNTER — Other Ambulatory Visit: Payer: Medicare Other

## 2019-07-15 DIAGNOSIS — H353211 Exudative age-related macular degeneration, right eye, with active choroidal neovascularization: Secondary | ICD-10-CM | POA: Diagnosis not present

## 2019-07-15 DIAGNOSIS — H35351 Cystoid macular degeneration, right eye: Secondary | ICD-10-CM | POA: Diagnosis not present

## 2019-07-15 DIAGNOSIS — H43811 Vitreous degeneration, right eye: Secondary | ICD-10-CM | POA: Diagnosis not present

## 2019-07-20 DIAGNOSIS — Z23 Encounter for immunization: Secondary | ICD-10-CM | POA: Diagnosis not present

## 2019-09-04 DIAGNOSIS — H35351 Cystoid macular degeneration, right eye: Secondary | ICD-10-CM | POA: Insufficient documentation

## 2019-09-04 DIAGNOSIS — H43811 Vitreous degeneration, right eye: Secondary | ICD-10-CM | POA: Insufficient documentation

## 2019-09-04 DIAGNOSIS — H40003 Preglaucoma, unspecified, bilateral: Secondary | ICD-10-CM | POA: Insufficient documentation

## 2019-09-04 DIAGNOSIS — H353112 Nonexudative age-related macular degeneration, right eye, intermediate dry stage: Secondary | ICD-10-CM | POA: Insufficient documentation

## 2019-09-04 DIAGNOSIS — H4051X3 Glaucoma secondary to other eye disorders, right eye, severe stage: Secondary | ICD-10-CM

## 2019-09-04 DIAGNOSIS — H26492 Other secondary cataract, left eye: Secondary | ICD-10-CM | POA: Insufficient documentation

## 2019-09-04 DIAGNOSIS — H353211 Exudative age-related macular degeneration, right eye, with active choroidal neovascularization: Secondary | ICD-10-CM | POA: Insufficient documentation

## 2019-09-04 HISTORY — DX: Glaucoma secondary to other eye disorders, right eye, severe stage: H40.51X3

## 2019-09-05 ENCOUNTER — Encounter (INDEPENDENT_AMBULATORY_CARE_PROVIDER_SITE_OTHER): Payer: Self-pay | Admitting: Ophthalmology

## 2019-09-05 ENCOUNTER — Ambulatory Visit (INDEPENDENT_AMBULATORY_CARE_PROVIDER_SITE_OTHER): Payer: Medicare Other | Admitting: Ophthalmology

## 2019-09-05 ENCOUNTER — Other Ambulatory Visit: Payer: Self-pay

## 2019-09-05 DIAGNOSIS — H40003 Preglaucoma, unspecified, bilateral: Secondary | ICD-10-CM

## 2019-09-05 DIAGNOSIS — H35351 Cystoid macular degeneration, right eye: Secondary | ICD-10-CM | POA: Diagnosis not present

## 2019-09-05 DIAGNOSIS — H26492 Other secondary cataract, left eye: Secondary | ICD-10-CM | POA: Diagnosis not present

## 2019-09-05 DIAGNOSIS — H43811 Vitreous degeneration, right eye: Secondary | ICD-10-CM

## 2019-09-05 DIAGNOSIS — H4051X3 Glaucoma secondary to other eye disorders, right eye, severe stage: Secondary | ICD-10-CM

## 2019-09-05 DIAGNOSIS — H353211 Exudative age-related macular degeneration, right eye, with active choroidal neovascularization: Secondary | ICD-10-CM

## 2019-09-05 DIAGNOSIS — H353112 Nonexudative age-related macular degeneration, right eye, intermediate dry stage: Secondary | ICD-10-CM | POA: Diagnosis not present

## 2019-09-05 MED ORDER — BEVACIZUMAB CHEMO INJECTION 1.25MG/0.05ML SYRINGE FOR KALEIDOSCOPE
1.2500 mg | INTRAVITREAL | Status: AC | PRN
  Administered 2019-09-05: 11:00:00 1.25 mg via INTRAVITREAL

## 2019-09-05 NOTE — Assessment & Plan Note (Signed)
The nature of wet macular degeneration was discussed with the patient.  Forms of therapy reviewed include the use of Anti-VEGF medications injected painlessly into the eye, as well as other possible treatment modalities, including thermal laser therapy. Fellow eye involvement and risks were discussed with the patient. Upon the finding of wet age related macular degeneration, treatment will be offered. The treatment regimen is on a treat as needed basis with the intent to treat if necessary and extend interval of exams when possible. On average 1 out of 6 patients do not need lifetime therapy. However, the risk of recurrent disease is high for a lifetime.  Initially monthly, then periodic, examinations and evaluations will determine whether the next treatment is required on the day of the examination.  OD with history of multiple recurrences with longer interval of examination.  Old disciform scar with overlying retinal pigment epithelial rip, chronic CME, with good visual acuity stable with repeat Avastin today

## 2019-10-04 ENCOUNTER — Other Ambulatory Visit: Payer: Self-pay | Admitting: Family Medicine

## 2019-10-04 ENCOUNTER — Other Ambulatory Visit: Payer: Self-pay | Admitting: *Deleted

## 2019-10-04 MED ORDER — DONEPEZIL HCL 5 MG PO TABS: ORAL_TABLET | ORAL | 0 refills | Status: DC

## 2019-10-04 NOTE — Telephone Encounter (Signed)
May have this +2 refill needs follow-up visit this summer

## 2019-10-15 ENCOUNTER — Ambulatory Visit (INDEPENDENT_AMBULATORY_CARE_PROVIDER_SITE_OTHER): Payer: Medicare Other | Admitting: Family Medicine

## 2019-10-15 ENCOUNTER — Encounter: Payer: Self-pay | Admitting: Family Medicine

## 2019-10-15 ENCOUNTER — Other Ambulatory Visit: Payer: Self-pay

## 2019-10-15 VITALS — BP 130/84 | Temp 98.0°F | Wt 169.0 lb

## 2019-10-15 DIAGNOSIS — E538 Deficiency of other specified B group vitamins: Secondary | ICD-10-CM | POA: Diagnosis not present

## 2019-10-15 DIAGNOSIS — E782 Mixed hyperlipidemia: Secondary | ICD-10-CM | POA: Diagnosis not present

## 2019-10-15 DIAGNOSIS — G300 Alzheimer's disease with early onset: Secondary | ICD-10-CM | POA: Diagnosis not present

## 2019-10-15 DIAGNOSIS — F0280 Dementia in other diseases classified elsewhere without behavioral disturbance: Secondary | ICD-10-CM

## 2019-10-15 DIAGNOSIS — E038 Other specified hypothyroidism: Secondary | ICD-10-CM | POA: Diagnosis not present

## 2019-10-15 DIAGNOSIS — I10 Essential (primary) hypertension: Secondary | ICD-10-CM

## 2019-10-15 MED ORDER — DONEPEZIL HCL 5 MG PO TABS: ORAL_TABLET | ORAL | 2 refills | Status: DC

## 2019-10-15 MED ORDER — LEVOTHYROXINE SODIUM 100 MCG PO TABS: 100.0000 ug | ORAL_TABLET | Freq: Every day | ORAL | 2 refills | Status: DC

## 2019-10-15 MED ORDER — CYANOCOBALAMIN 1000 MCG/ML IJ SOLN
1000.0000 ug | Freq: Once | INTRAMUSCULAR | Status: AC
Start: 2019-10-15 — End: 2019-10-15
  Administered 2019-10-15: 1000 ug via INTRAMUSCULAR

## 2019-10-15 MED ORDER — ROSUVASTATIN CALCIUM 10 MG PO TABS: ORAL_TABLET | ORAL | 1 refills | Status: DC

## 2019-10-15 MED ORDER — MEMANTINE HCL 5 MG PO TABS
5.0000 mg | ORAL_TABLET | Freq: Two times a day (BID) | ORAL | 3 refills | Status: DC
Start: 2019-10-15 — End: 2019-11-21

## 2019-10-15 NOTE — Progress Notes (Signed)
   Subjective:    Patient ID: Susan Mcneil, female    DOB: 1945/07/04, 74 y.o.   MRN: [id number]  Hyperlipidemia This is a chronic problem. Treatments tried: crestor.  Hypothyroidism: currently taking synthroid.  Patient husband states he thinks her memory has gotten worse.  Typically they go out to eat The husband helps her with her medicines The patient repeats herself a lot Sometimes gets frustrated when things do not go her way No hallucinations no behavior issues otherwise Montreal cognitive assessment shows significant decline Montreal Cognitive Assessment  10/15/2019 12/15/2017  Visuospatial/ Executive (0/5) 0 3  Naming (0/3) 3 3  Attention: Read list of digits (0/2) 0 1  Attention: Read list of letters (0/1) 1 1  Attention: Serial 7 subtraction starting at 100 (0/3) 0 2  Language: Repeat phrase (0/2) 2 2  Language : Fluency (0/1) 0 0  Abstraction (0/2) 1 2  Delayed Recall (0/5) 0 0  Orientation (0/6) 5 3  Total 12 17  Adjusted Score (based on education) 12 18  Patient has been on Aricept since 2019 [place] cognitive assessment back 12/15/2017 was 18  Back in June 2018 she could name 3 items and draw clock and to have 1 out of 3 recall after drawing a clock Review of Systems     Objective:   Physical Exam  Lungs clear respiratory rate normal heart regular no murmurs extremities no edema skin warm dry      Assessment & Plan:  Hypothyroidism repeat thyroid lab work this summer B12 shot today Follow-up in 1 month Worsening dementia Diagnosis of Alzheimer's Initiate Namenda 5 mg twice daily Follow-up in 1 month At that time we will bump up the dose of the medicine to 10 mg twice daily   Offered family referral to neurology (unlikely neurology would do anything different) family defers at this time  I did let the patient and the husband know that she should not do any driving at this time

## 2019-10-15 NOTE — Patient Instructions (Signed)
No driving Starting Namenda twice a day Continue doepezil  Recheck here in 4 weeks

## 2019-10-31 ENCOUNTER — Other Ambulatory Visit: Payer: Self-pay

## 2019-10-31 ENCOUNTER — Ambulatory Visit (INDEPENDENT_AMBULATORY_CARE_PROVIDER_SITE_OTHER): Payer: Medicare Other | Admitting: Ophthalmology

## 2019-10-31 ENCOUNTER — Encounter (INDEPENDENT_AMBULATORY_CARE_PROVIDER_SITE_OTHER): Payer: Self-pay | Admitting: Ophthalmology

## 2019-10-31 DIAGNOSIS — H353211 Exudative age-related macular degeneration, right eye, with active choroidal neovascularization: Secondary | ICD-10-CM | POA: Diagnosis not present

## 2019-10-31 MED ORDER — BEVACIZUMAB CHEMO INJECTION 1.25MG/0.05ML SYRINGE FOR KALEIDOSCOPE
1.2500 mg | INTRAVITREAL | Status: AC | PRN
  Administered 2019-10-31: 1.25 mg via INTRAVITREAL

## 2019-10-31 NOTE — Assessment & Plan Note (Signed)
The nature of wet macular degeneration was discussed with the patient.  Forms of therapy reviewed include the use of Anti-VEGF medications injected painlessly into the eye, as well as other possible treatment modalities, including thermal laser therapy. Fellow eye involvement and risks were discussed with the patient. Upon the finding of wet age related macular degeneration, treatment will be offered. The treatment regimen is on a treat as needed basis with the intent to treat if necessary and extend interval of exams when possible. On average 1 out of 6 patients do not need lifetime therapy. However, the risk of recurrent disease is high for a lifetime.  Initially monthly, then periodic, examinations and evaluations will determine whether the next treatment is required on the day of the examination.  OD, chronically active with subfoveal disciform scar, fibrotic PED with intraretinal fluid, CME.  Improved yet stable at 8-week examination.  Will repeat treat today and exam in 8 weeks 

## 2019-10-31 NOTE — Progress Notes (Addendum)
10/31/2019     CHIEF COMPLAINT Patient presents for Retina Follow Up   HISTORY OF PRESENT ILLNESS: Susan Mcneil is a 74 y.o. female who presents to the clinic today for:   HPI    Retina Follow Up    Patient presents with  Wet AMD.  In right eye.  Duration of 8 weeks.  Since onset it is stable.          Comments    8 week f.u - OCT OU, Poss Avastin OD Patient denies change in vision and overall has no complaints.        Last edited by Gerda Diss on 10/31/2019 10:34 AM. (History)      Referring physician: Kathyrn Drown, MD [address] [place],  [place] [address]  HISTORICAL INFORMATION:   Selected notes from the MEDICAL RECORD NUMBER       CURRENT MEDICATIONS: Current Outpatient Medications (Ophthalmic Drugs)  Medication Sig  . Travoprost, BAK Free, (TRAVATAN) 0.004 % SOLN ophthalmic solution Place 1 drop into the right eye at bedtime.    No current facility-administered medications for this visit. (Ophthalmic Drugs)   Current Outpatient Medications (Other)  Medication Sig  . Calcium Citrate (CITRACAL PO) Take 1 tablet by mouth daily.   . Coenzyme Q10 (COQ10 PO) Take 1 capsule by mouth daily.  Marland Kitchen donepezil (ARICEPT) 5 MG tablet Take one tablet by mouth daily  . famotidine (PEPCID) 40 MG tablet TAKE 1 TABLET(40 MG) BY MOUTH AT BEDTIME (Patient not taking: Reported on 10/15/2019)  . levothyroxine (SYNTHROID) 100 MCG tablet Take 1 tablet (100 mcg total) by mouth daily.  . memantine (NAMENDA) 5 MG tablet Take 1 tablet (5 mg total) by mouth 2 (two) times daily.  . rosuvastatin (CRESTOR) 10 MG tablet TAKE 1 TABLET(10 MG) BY MOUTH AT BEDTIME   No current facility-administered medications for this visit. (Other)      REVIEW OF SYSTEMS:    ALLERGIES No Known Allergies  PAST MEDICAL HISTORY Past Medical History:  Diagnosis Date  . Angiomyolipoma of left kidney 06/14/2017   Followed by [facility], most recent testing shows it is stable, they  recommending follow-up in 2 years, June 14, 2017-follow-up will be in 2021  . Fatty liver   . HTN (hypertension)    cholesterol & thyroid  . Hypothyroidism   . Kidney stone   . Macular degeneration   . Macular degeneration, bilateral   . Memory disturbance   . Osteopenia   . Osteoporosis   . Reflux gastritis   . Thyroid disorder    Past Surgical History:  Procedure Laterality Date  . ABDOMINAL HYSTERECTOMY   patial, pre-uterine cancer, 1974  . BIOPSY  10/27/2016   Procedure: BIOPSY;  Surgeon: Rogene Houston, MD;  [facility];  Service: Endoscopy;;  esophagus  . BIOPSY  01/17/2018   Procedure: BIOPSY;  Surgeon: Rogene Houston, MD;  [facility];  Service: Endoscopy;;  colon  . COLONOSCOPY N/A 04/01/2015   Procedure: COLONOSCOPY;  Surgeon: Rogene Houston, MD;  [facility];  Service: Endoscopy;  Laterality: N/A;  1030  . COLONOSCOPY N/A 01/17/2018   Procedure: COLONOSCOPY;  Surgeon: Rogene Houston, MD;  [facility];  Service: Endoscopy;  Laterality: N/A;  9:30  . DIAGNOSTIC LAPAROSCOPY    . ESOPHAGOGASTRODUODENOSCOPY N/A 10/27/2016   Procedure: ESOPHAGOGASTRODUODENOSCOPY (EGD);  Surgeon: Rogene Houston, MD;  [facility];  Service: Endoscopy;  Laterality:  N/A;  100  . TOTAL ABDOMINAL HYSTERECTOMY W/ BILATERAL SALPINGOOPHORECTOMY      FAMILY HISTORY Family History  Problem Relation Age of Onset  . Cancer Mother   . Stroke Father     SOCIAL HISTORY Social History   Tobacco Use  . Smoking status: Former Smoker    Quit date: 12/07/2000    Years since quitting: 18.9  . Smokeless tobacco: Never Used  Substance Use Topics  . Alcohol use: No  . Drug use: No         OPHTHALMIC EXAM:  Base Eye Exam    Visual Acuity (Snellen - Linear)      Right Left   Dist [place] 20/25-2 20/20       Tonometry (Tonopen, 10:40 AM)      Right Left   Pressure 15 15       Pupils      Pupils Dark Light Shape React APD    Right PERRL 3 2 Round Brisk None   Left PERRL 3 2 Round Brisk None       Visual Fields (Counting fingers)      Left Right    Full Full       Extraocular Movement      Right Left    Full Full       Neuro/Psych    Oriented x3: Yes   Mood/Affect: Normal       Dilation    Right eye: 1.0% Mydriacyl, 2.5% Phenylephrine @ 10:40 AM        Slit Lamp and Fundus Exam    External Exam      Right Left   External Normal Normal       Slit Lamp Exam      Right Left   Lids/Lashes Normal Normal   Conjunctiva/Sclera White and quiet White and quiet   Cornea Clear Clear   Anterior Chamber Deep and quiet Deep and quiet   Iris Round and reactive Round and reactive   Lens Posterior chamber intraocular lens Posterior chamber intraocular lens   Anterior Vitreous Normal Normal       Fundus Exam      Right Left   Posterior Vitreous Posterior vitreous detachment    Disc Normal    C/D Ratio 0.7    Macula Atrophy, Geographic atrophy, Disciform scar, Cystoid macular edema    Vessels Normal    Periphery Normal           IMAGING AND PROCEDURES  Imaging and Procedures for 11/01/19  OCT, Retina - OU - Both Eyes       Right Eye Quality was good. Scan locations included subfoveal. Central Foveal Thickness: 341. Progression has improved. Findings include abnormal foveal contour, cystoid macular edema, choroidal neovascular membrane, intraretinal fluid, disciform scar.   Left Eye Quality was good. Scan locations included subfoveal. Central Foveal Thickness: 275. Findings include abnormal foveal contour, retinal drusen , no SRF, no IRF.   Notes OD, with much less CME over the dome of the pigment epithelial detachment, disciform scar OS no wet ARMD       Intravitreal Injection, Pharmacologic Agent - OS - Left Eye       Time Out 10/31/2019. 11:35 AM. Confirmed correct patient, procedure, site, and patient consented.   Anesthesia Topical anesthesia was used. Anesthetic medications  included Akten 3.5%.   Procedure Preparation included Ofloxacin , 10% betadine to eyelids. A 30 gauge needle was used.   Injection:  1.25 mg Bevacizumab (AVASTIN) SOLN  NDC: [phone], Lot: [id number]   Route: Intravitreal, Site: Left Eye, Waste: 0 mg  Post-op Post injection exam found visual acuity of at least counting fingers. The patient tolerated the procedure well. There were no complications. The patient received written and verbal post procedure care education. Post injection medications were not given.        Intravitreal Injection, Pharmacologic Agent - OD - Right Eye       Time Out 10/31/2019. 2:13 PM. Confirmed correct patient, procedure, site, and patient consented.   Anesthesia Topical anesthesia was used. Anesthetic medications included Akten 3.5%.   Procedure Preparation included Ofloxacin , Tobramycin 0.3%, 10% betadine to eyelids. A 30 gauge needle was used.   Injection:  1.25 mg Bevacizumab (AVASTIN) SOLN   NDC: [address]-6   Route: Intravitreal, Site: Right Eye, Waste: 0 mg  Post-op Post injection exam found visual acuity of at least counting fingers. The patient tolerated the procedure well. There were no complications. The patient received written and verbal post procedure care education. Post injection medications were not given.                 ASSESSMENT/PLAN:  Exudative age-related macular degeneration of right eye with active choroidal neovascularization (HCC) The nature of wet macular degeneration was discussed with the patient.  Forms of therapy reviewed include the use of Anti-VEGF medications injected painlessly into the eye, as well as other possible treatment modalities, including thermal laser therapy. Fellow eye involvement and risks were discussed with the patient. Upon the finding of wet age related macular degeneration, treatment will be offered. The treatment regimen is on a treat as needed basis with the intent to treat if necessary  and extend interval of exams when possible. On average 1 out of 6 patients do not need lifetime therapy. However, the risk of recurrent disease is high for a lifetime.  Initially monthly, then periodic, examinations and evaluations will determine whether the next treatment is required on the day of the examination.  OD, chronically active with subfoveal disciform scar, fibrotic PED with intraretinal fluid, CME.  Improved yet stable at 8-week examination.  Will repeat treat today and exam in 8 weeks      ICD-10-CM   1. Exudative age-related macular degeneration of right eye with active choroidal neovascularization (HCC)  H35.3211 OCT, Retina - OU - Both Eyes    Intravitreal Injection, Pharmacologic Agent - OS - Left Eye    Bevacizumab (AVASTIN) SOLN 1.25 mg    Intravitreal Injection, Pharmacologic Agent - OD - Right Eye    Bevacizumab (AVASTIN) SOLN 1.25 mg    1.OD, chronically active with subfoveal disciform scar, fibrotic PED with intraretinal fluid, CME.  Improved yet stable at 8-week examination.  Will repeat treat today and exam in 8 weeks  2.  The note performed on 10/31/2019 stipulates the left eye was treated.  This was the incorrect documentation.  The left eye was not treated.  The charges but more importantly the documentation the left eye received of intravitreal Avastin is incorrect.  3.  Intravitreal Avastin OD only delivered today  Ophthalmic Meds Ordered this visit:  Meds ordered this encounter  Medications  . Bevacizumab (AVASTIN) SOLN 1.25 mg  . Bevacizumab (AVASTIN) SOLN 1.25 mg       Return in about 8 weeks (around 12/26/2019) for AVASTIN OCT, OD.  There are no Patient Instructions on file for this visit.   Explained the diagnoses, plan, and follow up with the patient and they expressed understanding.  Patient expressed understanding of the importance of proper follow up care.   Clent Demark Greta Yung M.D. Diseases & Surgery of the Retina and Vitreous Retina & Diabetic  [place] 11/01/19     Abbreviations: M myopia (nearsighted); A astigmatism; H hyperopia (farsighted); P presbyopia; Mrx spectacle prescription;  CTL contact lenses; OD right eye; OS left eye; OU both eyes  XT exotropia; ET esotropia; PEK punctate epithelial keratitis; PEE punctate epithelial erosions; DES dry eye syndrome; MGD meibomian gland dysfunction; ATs artificial tears; PFAT's preservative free artificial tears; [place] nuclear sclerotic cataract; PSC posterior subcapsular cataract; ERM epi-retinal membrane; PVD posterior vitreous detachment; RD retinal detachment; DM diabetes mellitus; DR diabetic retinopathy; NPDR non-proliferative diabetic retinopathy; PDR proliferative diabetic retinopathy; CSME clinically significant macular edema; DME diabetic macular edema; dbh dot blot hemorrhages; CWS cotton wool spot; POAG primary open angle glaucoma; C/D cup-to-disc ratio; HVF humphrey visual field; GVF goldmann visual field; OCT optical coherence tomography; IOP intraocular pressure; BRVO Branch retinal vein occlusion; CRVO central retinal vein occlusion; CRAO central retinal artery occlusion; BRAO branch retinal artery occlusion; RT retinal tear; SB scleral buckle; PPV pars plana vitrectomy; VH Vitreous hemorrhage; PRP panretinal laser photocoagulation; IVK intravitreal kenalog; VMT vitreomacular traction; MH Macular hole;  NVD neovascularization of the disc; NVE neovascularization elsewhere; AREDS age related eye disease study; ARMD age related macular degeneration; POAG primary open angle glaucoma; EBMD epithelial/anterior basement membrane dystrophy; ACIOL anterior chamber intraocular lens; IOL intraocular lens; PCIOL posterior chamber intraocular lens; Phaco/IOL phacoemulsification with intraocular lens placement; [place] photorefractive keratectomy; LASIK laser assisted in situ keratomileusis; HTN hypertension; DM diabetes mellitus; COPD chronic obstructive pulmonary disease

## 2019-11-01 DIAGNOSIS — H353211 Exudative age-related macular degeneration, right eye, with active choroidal neovascularization: Secondary | ICD-10-CM | POA: Diagnosis not present

## 2019-11-01 MED ORDER — BEVACIZUMAB CHEMO INJECTION 1.25MG/0.05ML SYRINGE FOR KALEIDOSCOPE
1.2500 mg | INTRAVITREAL | Status: AC | PRN
  Administered 2019-11-01: 1.25 mg via INTRAVITREAL

## 2019-11-19 LAB — TSH: TSH: 0.219 u[IU]/mL — ABNORMAL LOW (ref 0.450–4.500)

## 2019-11-19 LAB — T4, FREE: Free T4: 1.59 ng/dL (ref 0.82–1.77)

## 2019-11-21 ENCOUNTER — Ambulatory Visit (INDEPENDENT_AMBULATORY_CARE_PROVIDER_SITE_OTHER): Payer: Medicare Other | Admitting: Family Medicine

## 2019-11-21 ENCOUNTER — Encounter: Payer: Self-pay | Admitting: Family Medicine

## 2019-11-21 ENCOUNTER — Other Ambulatory Visit: Payer: Self-pay

## 2019-11-21 VITALS — BP 140/82 | Temp 97.8°F | Ht 64.0 in | Wt 169.8 lb

## 2019-11-21 DIAGNOSIS — E038 Other specified hypothyroidism: Secondary | ICD-10-CM | POA: Diagnosis not present

## 2019-11-21 DIAGNOSIS — G309 Alzheimer's disease, unspecified: Secondary | ICD-10-CM | POA: Insufficient documentation

## 2019-11-21 DIAGNOSIS — F0280 Dementia in other diseases classified elsewhere without behavioral disturbance: Secondary | ICD-10-CM | POA: Diagnosis not present

## 2019-11-21 DIAGNOSIS — G308 Other Alzheimer's disease: Secondary | ICD-10-CM | POA: Diagnosis not present

## 2019-11-21 DIAGNOSIS — E782 Mixed hyperlipidemia: Secondary | ICD-10-CM | POA: Diagnosis not present

## 2019-11-21 DIAGNOSIS — E538 Deficiency of other specified B group vitamins: Secondary | ICD-10-CM | POA: Diagnosis not present

## 2019-11-21 HISTORY — DX: Dementia in other diseases classified elsewhere, unspecified severity, without behavioral disturbance, psychotic disturbance, mood disturbance, and anxiety: F02.80

## 2019-11-21 MED ORDER — CYANOCOBALAMIN 1000 MCG/ML IJ SOLN
1000.0000 ug | Freq: Once | INTRAMUSCULAR | Status: AC
  Administered 2019-11-21: 1000 ug via INTRAMUSCULAR

## 2019-11-21 MED ORDER — MEMANTINE HCL 10 MG PO TABS
10.0000 mg | ORAL_TABLET | Freq: Two times a day (BID) | ORAL | 5 refills | Status: DC
Start: 2019-11-21 — End: 2020-08-04

## 2019-11-21 NOTE — Progress Notes (Signed)
   Subjective:    Patient ID: Susan Mcneil, female    DOB: May 22, 1945, 74 y.o.   MRN: [id number]  Hypertension This is a chronic problem. The current episode started more than 1 year ago. Risk factors for coronary artery disease include post-menopausal state and dyslipidemia. There are no compliance problems.    B12 deficiency - Plan: cyanocobalamin ((VITAMIN B-12)) injection 1,000 mcg  Mixed hyperlipidemia  Alzheimer's disease of other onset without behavioral disturbance ([place])  Other specified hypothyroidism     Review of Systems Denies any chest tightness pressure pain shortness of breath vomiting diarrhea fever chills    Objective:   Physical Exam Lungs clear respiratory normal heart regular no murmurs extremities no edema skin warm dry .thois Results for orders placed or performed in visit on 06/21/19  TSH  Result Value Ref Range   TSH 0.219 (L) 0.450 - 4.500 uIU/mL  T4, free  Result Value Ref Range   Free T4 1.59 0.82 - 1.77 ng/dL    Fall Risk  12/06/2018 10/20/2016 10/06/2015 11/20/2013  Falls in the past year? 0 No No No  Comment Emmi Telephone Survey: data to providers prior to load - - -         Assessment & Plan:  1. B12 deficiency Continue B12 monthly - cyanocobalamin ((VITAMIN B-12)) injection 1,000 mcg  2. Mixed hyperlipidemia Watch diet closely  3. Alzheimer's disease of other onset without behavioral disturbance (HCC) Increase Namenda 10 mg twice daily continue Aricept daily  4. Other specified hypothyroidism Adjust thyroid medicine half tablet on Monday one on all other days  Follow-up in approximately 3 months sooner if problems

## 2019-11-22 DIAGNOSIS — H353210 Exudative age-related macular degeneration, right eye, stage unspecified: Secondary | ICD-10-CM | POA: Diagnosis not present

## 2019-11-22 DIAGNOSIS — H401131 Primary open-angle glaucoma, bilateral, mild stage: Secondary | ICD-10-CM | POA: Diagnosis not present

## 2019-11-22 MED ORDER — LEVOTHYROXINE SODIUM 100 MCG PO TABS: ORAL_TABLET | ORAL | 2 refills | Status: DC

## 2019-11-22 NOTE — Addendum Note (Signed)
Addended by: Vicente Males on: 11/22/2019 08:16 AM   Modules accepted: Orders

## 2019-11-22 NOTE — Progress Notes (Signed)
Lab orders placed and medication updated. Lab orders mailed to patient

## 2019-12-12 IMAGING — MR MR SHOULDER*L* W/O CM
5 series · 40 of 40 positions shown · non-contrast
Comparison: Radiographs 03/24/2017

CLINICAL DATA: Left shoulder pain for 2 months.

EXAM:
MRI OF THE LEFT SHOULDER WITHOUT CONTRAST
TECHNIQUE: Multiplanar, multisequence MR imaging of the shoulder was performed.
No intravenous contrast was administered.

[Series 3: t2fs axial blade · axial · 3.0mm · 0.49mm/px · z∈[-39,+33]mm · 7 of 22 slices shown]
[im 1/22]
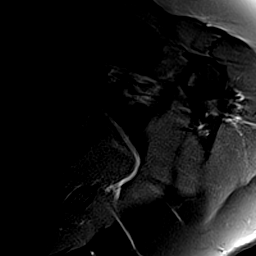
[im 4/22]
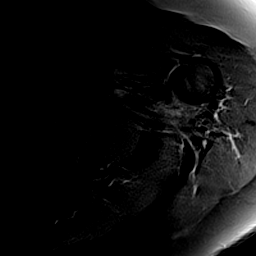
[im 8/22]
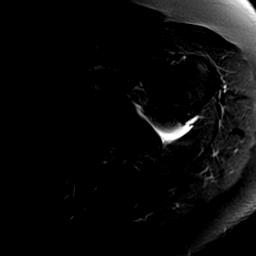
[im 11/22]
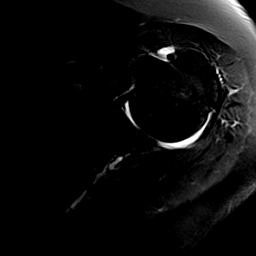
[im 15/22]
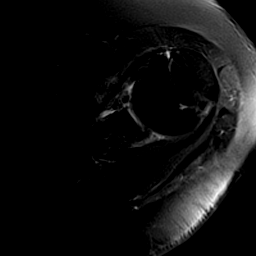
[im 18/22]
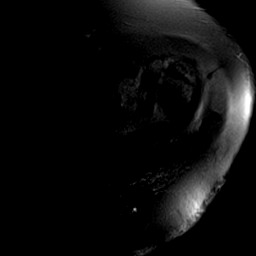
[im 22/22]
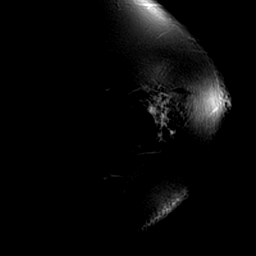

[Series 4: t2fs_blade_cor · oblique · 3.0mm · 0.54mm/px · 8 of 22 slices shown]
[im 1/22]
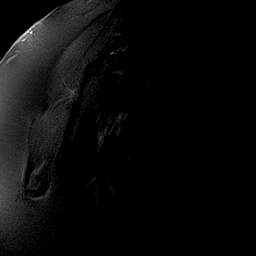
[im 4/22]
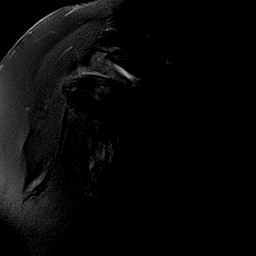
[im 7/22]
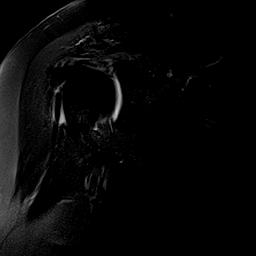
[im 10/22]
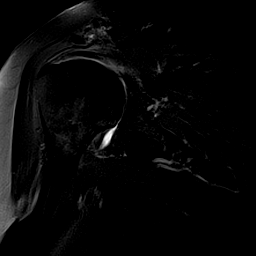
[im 13/22]
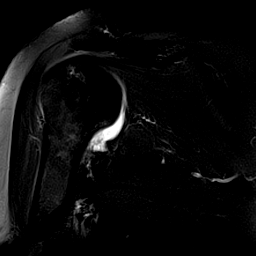
[im 16/22]
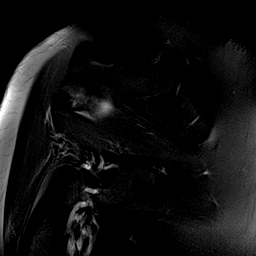
[im 19/22]
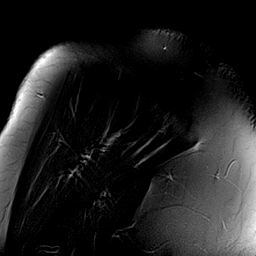
[im 22/22]
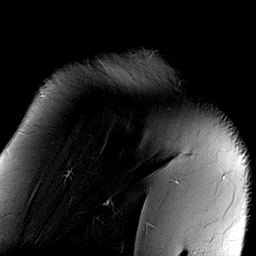

[Series 6: T1 · oblique · 3.0mm · 0.23mm/px · 9 of 24 slices shown]
[im 1/24]
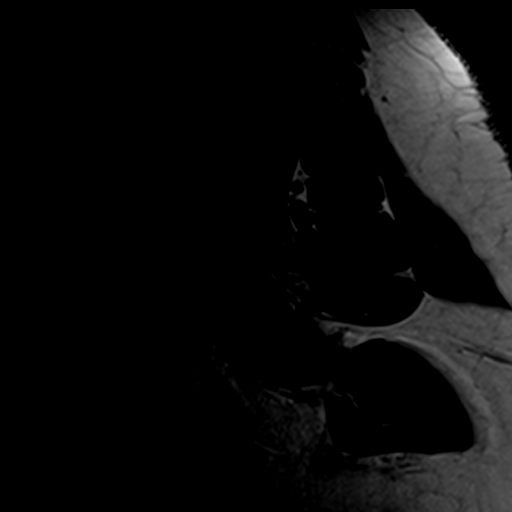
[im 3/24]
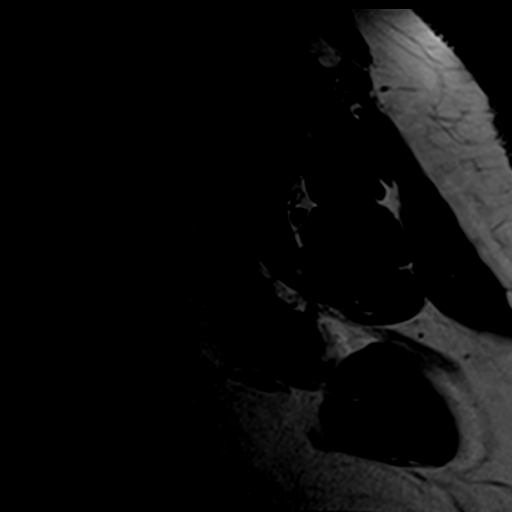
[im 6/24]
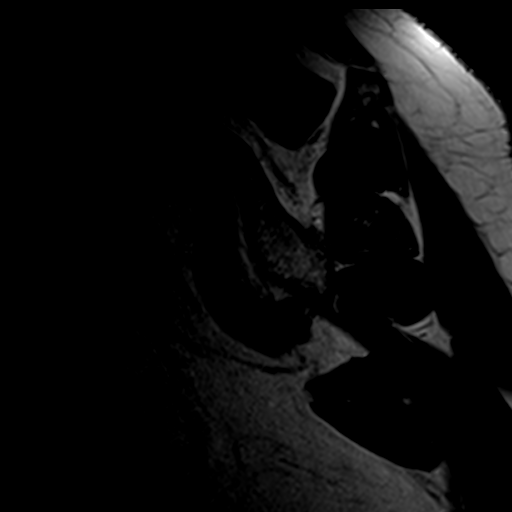
[im 9/24]
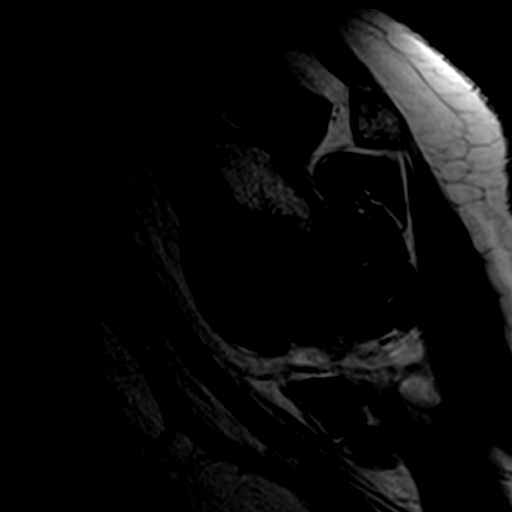
[im 12/24]
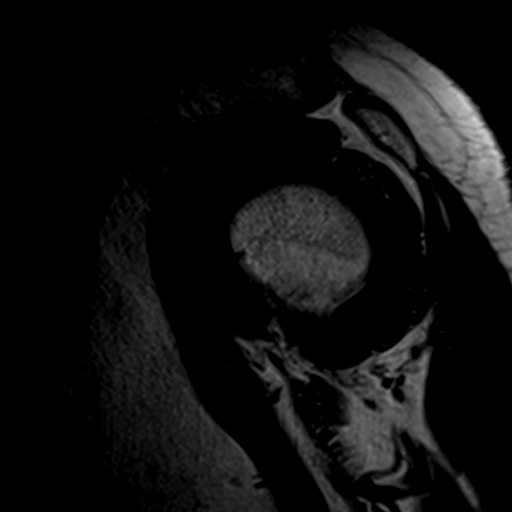
[im 15/24]
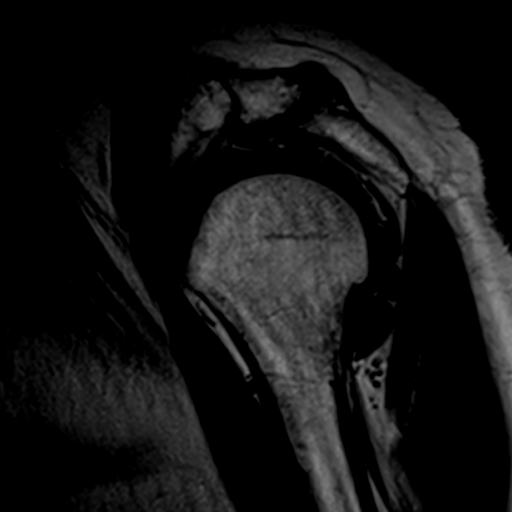
[im 18/24]
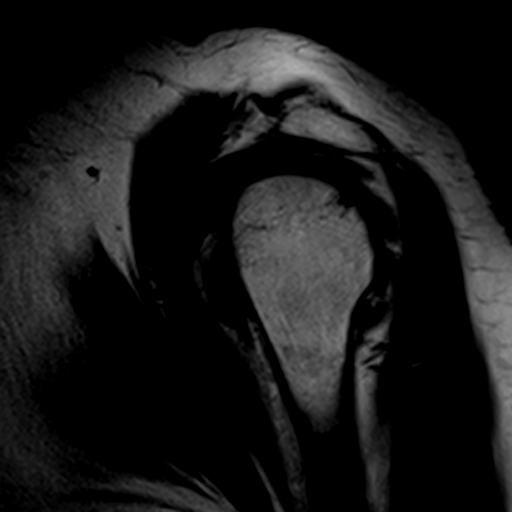
[im 21/24]
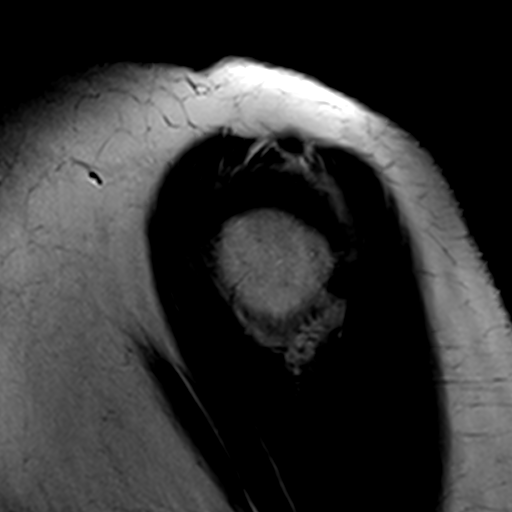
[im 24/24]
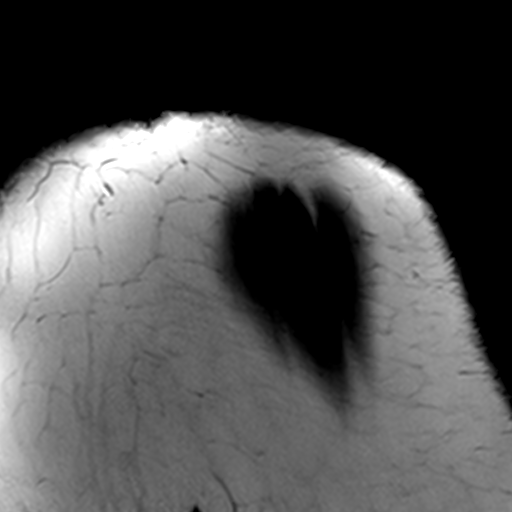

[Series 7: t2fs_blade_sag · oblique · 3.0mm · 0.50mm/px · 9 of 24 slices shown]
[im 1/24]
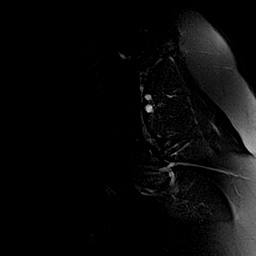
[im 3/24]
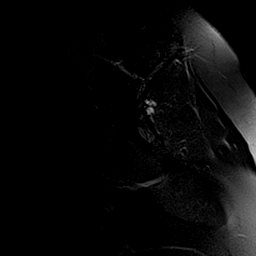
[im 6/24]
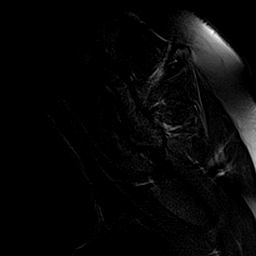
[im 9/24]
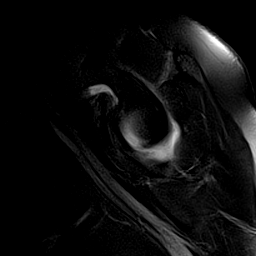
[im 12/24]
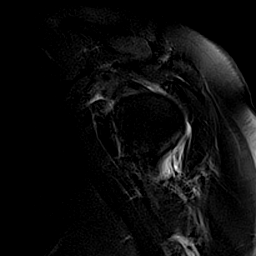
[im 15/24]
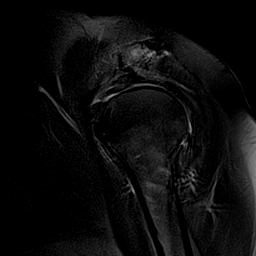
[im 18/24]
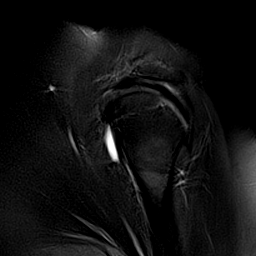
[im 21/24]
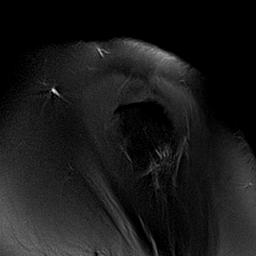
[im 24/24]
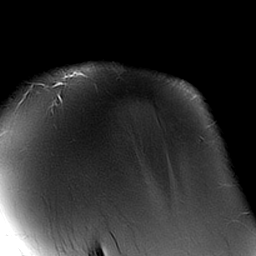

[Series 8: pd_blade_cor · oblique · 3.0mm · 0.48mm/px · 7 of 20 slices shown]
[im 1/20]
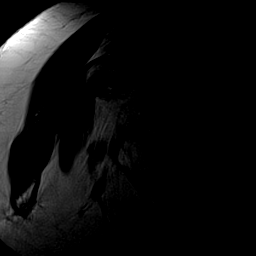
[im 4/20]
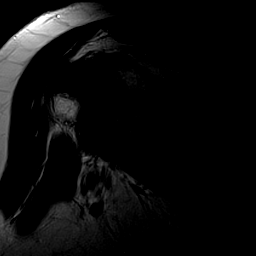
[im 7/20]
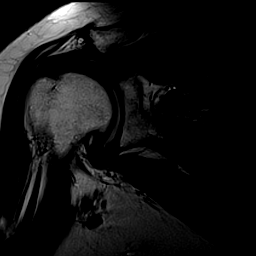
[im 10/20]
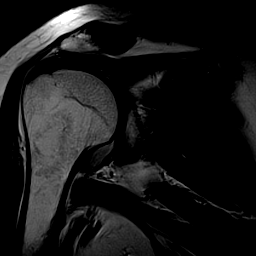
[im 13/20]
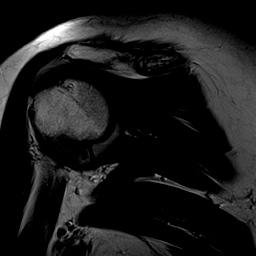
[im 16/20]
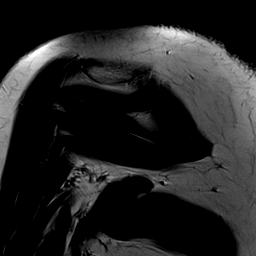
[im 20/20]
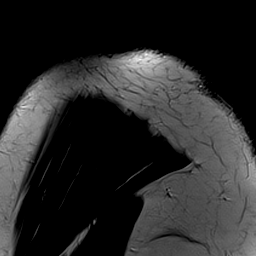

[40 of 40 positions shown; findings below may reference images not displayed]

FINDINGS: Mild motion degraded examination.

Rotator cuff: Moderate rotator cuff tendinopathy/tendinosis with
interstitial tears. No full-thickness retracted rotator cuff tear.
Small articular surface tears at the anterior footprint attachment
region of the supraspinatus tendon.

Muscles:  Normal

Biceps long head:  Intact

Acromioclavicular Joint: Moderate AC joint degenerative changes with
inferior spurring from the distal clavicle. Type 1-2 acromion. No
lateral downsloping or undersurface spurring.

Glenohumeral Joint: No degenerative changes. Small joint effusion
and mild synovitis versus adhesive capsulitis.

Labrum: No obvious labral tears. Degenerative type fraying changes
involving the superior labrum.

Bones:  No acute bony findings.

Other: Mild subacromial/subdeltoid bursitis.
IMPRESSION: 1. Moderate rotator cuff tendinopathy/tendinosis with interstitial
tears but no full-thickness retracted tear.
2. Small articular surface tear at the anterior footprint attachment
of the supraspinatus tendon.
3. Intact long head biceps tendon and glenoid labrum.
4. Small glenohumeral joint effusion and mild synovitis versus
adhesive capsulitis.
5. Mild subacromial/subdeltoid bursitis.

## 2019-12-22 ENCOUNTER — Other Ambulatory Visit: Payer: Self-pay | Admitting: Family Medicine

## 2019-12-26 ENCOUNTER — Other Ambulatory Visit: Payer: Self-pay

## 2019-12-26 ENCOUNTER — Ambulatory Visit (INDEPENDENT_AMBULATORY_CARE_PROVIDER_SITE_OTHER): Payer: Medicare Other | Admitting: Ophthalmology

## 2019-12-26 ENCOUNTER — Other Ambulatory Visit: Payer: Medicare Other

## 2019-12-26 ENCOUNTER — Encounter (INDEPENDENT_AMBULATORY_CARE_PROVIDER_SITE_OTHER): Payer: Self-pay | Admitting: Ophthalmology

## 2019-12-26 DIAGNOSIS — H353211 Exudative age-related macular degeneration, right eye, with active choroidal neovascularization: Secondary | ICD-10-CM

## 2019-12-26 MED ORDER — BEVACIZUMAB CHEMO INJECTION 1.25MG/0.05ML SYRINGE FOR KALEIDOSCOPE
1.2500 mg | INTRAVITREAL | Status: AC | PRN
  Administered 2019-12-26: 1.25 mg via INTRAVITREAL

## 2019-12-26 NOTE — Assessment & Plan Note (Signed)
The nature of wet macular degeneration was discussed with the patient.  Forms of therapy reviewed include the use of Anti-VEGF medications injected painlessly into the eye, as well as other possible treatment modalities, including thermal laser therapy. Fellow eye involvement and risks were discussed with the patient. Upon the finding of wet age related macular degeneration, treatment will be offered. The treatment regimen is on a treat as needed basis with the intent to treat if necessary and extend interval of exams when possible. On average 1 out of 6 patients do not need lifetime therapy. However, the risk of recurrent disease is high for a lifetime.  Initially monthly, then periodic, examinations and evaluations will determine whether the next treatment is required on the day of the examination.  OD, chronically active with subfoveal disciform scar, fibrotic PED with intraretinal fluid, CME.  Improved yet stable at 8-week examination.  Will repeat treat today and exam in 8 weeks

## 2019-12-26 NOTE — Progress Notes (Signed)
12/26/2019     CHIEF COMPLAINT Patient presents for Retina Follow Up   HISTORY OF PRESENT ILLNESS: Susan Mcneil is a 74 y.o. female who presents to the clinic today for:   HPI    Retina Follow Up    Patient presents with  Wet AMD.  In right eye.  Severity is moderate.  Duration of 8 weeks.  Since onset it is stable.  I, the attending physician,  performed the HPI with the patient and updated documentation appropriately.          Comments    8 Week Wet AMD f\u OD. Possible Avastin OD. OCT  Pt states OD vision has decreased. Denies any other complaints.       Last edited by Tilda Franco on 12/26/2019 10:52 AM. (History)      Referring physician: Kathyrn Drown, MD [address] [place],  [place] [address]  HISTORICAL INFORMATION:   Selected notes from the MEDICAL RECORD NUMBER       CURRENT MEDICATIONS: Current Outpatient Medications (Ophthalmic Drugs)  Medication Sig  . Travoprost, BAK Free, (TRAVATAN) 0.004 % SOLN ophthalmic solution Place 1 drop into the right eye at bedtime.    No current facility-administered medications for this visit. (Ophthalmic Drugs)   Current Outpatient Medications (Other)  Medication Sig  . Calcium Citrate (CITRACAL PO) Take 1 tablet by mouth daily.   . Coenzyme Q10 (COQ10 PO) Take 1 capsule by mouth daily.  Marland Kitchen donepezil (ARICEPT) 5 MG tablet Take one tablet by mouth daily  . famotidine (PEPCID) 40 MG tablet TAKE 1 TABLET(40 MG) BY MOUTH AT BEDTIME (Patient not taking: Reported on 10/15/2019)  . levothyroxine (SYNTHROID) 100 MCG tablet Take one half tablet po on Mondays and one tablet po all other days  . memantine (NAMENDA) 10 MG tablet Take 1 tablet (10 mg total) by mouth 2 (two) times daily.  . rosuvastatin (CRESTOR) 10 MG tablet TAKE 1 TABLET(10 MG) BY MOUTH AT BEDTIME   No current facility-administered medications for this visit. (Other)      REVIEW OF SYSTEMS:    ALLERGIES No Known Allergies  PAST MEDICAL  HISTORY Past Medical History:  Diagnosis Date  . Alzheimer's dementia ([place]) 11/21/2019  . 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 Fatty liver   . HTN (hypertension)    cholesterol & thyroid  . Hypothyroidism   . Kidney stone   . Macular degeneration   . Macular degeneration, bilateral   . Memory disturbance   . Osteopenia   . Osteoporosis   . Reflux gastritis   . Thyroid disorder    Past Surgical History:  Procedure Laterality Date  . ABDOMINAL HYSTERECTOMY   patial, pre-uterine cancer, 1974  . BIOPSY  10/27/2016   Procedure: BIOPSY;  Surgeon: Rogene Houston, MD;  [facility]: [facility];  Service: Endoscopy;;  esophagus  . BIOPSY  01/17/2018   Procedure: BIOPSY;  Surgeon: Rogene Houston, MD;  [facility]: [facility];  Service: Endoscopy;;  colon  . COLONOSCOPY N/A 04/01/2015   Procedure: COLONOSCOPY;  Surgeon: Rogene Houston, MD;  [facility]: [facility];  Service: Endoscopy;  Laterality: N/A;  1030  . COLONOSCOPY N/A 01/17/2018   Procedure: COLONOSCOPY;  Surgeon: Rogene Houston, MD;  [facility]: [facility];  Service: Endoscopy;  Laterality: N/A;  9:30  . DIAGNOSTIC LAPAROSCOPY    . ESOPHAGOGASTRODUODENOSCOPY N/A 10/27/2016   Procedure: ESOPHAGOGASTRODUODENOSCOPY (EGD);  Surgeon: Rogene Houston, MD;  [facility]: [facility];  Service: Endoscopy;  Laterality: N/A;  100  . TOTAL ABDOMINAL HYSTERECTOMY W/ BILATERAL SALPINGOOPHORECTOMY      FAMILY HISTORY Family History  Problem Relation Age of Onset  . Cancer Mother   . Stroke Father     SOCIAL HISTORY Social History   Tobacco Use  . Smoking status: Former Smoker    Quit date: 12/07/2000    Years since quitting: 19.0  . Smokeless tobacco: Never Used  Substance Use Topics  . Alcohol use: No  . Drug use: No         OPHTHALMIC EXAM:  Base Eye Exam     Visual Acuity (Snellen - Linear)      Right Left   Dist Ruth 20/40 -1 20/20   Dist ph  NI        Tonometry (Tonopen, 10:57 AM)      Right Left   Pressure 12 13       Pupils      Pupils Dark Light Shape React APD   Right PERRL 3 2 Round Brisk None   Left PERRL 3 2 Round Brisk None       Visual Fields (Counting fingers)      Left Right    Full Full       Neuro/Psych    Oriented x3: Yes   Mood/Affect: Normal       Dilation    Right eye: 1.0% Mydriacyl, 2.5% Phenylephrine @ 10:57 AM        Slit Lamp and Fundus Exam    External Exam      Right Left   External Normal Normal       Slit Lamp Exam      Right Left   Lids/Lashes Normal Normal   Conjunctiva/Sclera White and quiet White and quiet   Cornea Clear Clear   Anterior Chamber Deep and quiet Deep and quiet   Iris Round and reactive Round and reactive   Lens Posterior chamber intraocular lens Posterior chamber intraocular lens   Anterior Vitreous Normal Normal       Fundus Exam      Right Left   Posterior Vitreous Posterior vitreous detachment    Disc Normal    C/D Ratio 0.7    Macula Atrophy, Geographic atrophy, Disciform scar, Cystoid macular edema, Macular thickening, no exudates, no hemorrhage    Vessels Normal    Periphery Normal           IMAGING AND PROCEDURES  Imaging and Procedures for 12/26/19  OCT, Retina - OU - Both Eyes       Right Eye Quality was good. Scan locations included subfoveal. Central Foveal Thickness: 322. Findings include disciform scar, cystoid macular edema, no IRF, no SRF, retinal drusen .   Left Eye Quality was good. Scan locations included subfoveal. Central Foveal Thickness: 263. Progression has been stable. Findings include central retinal atrophy, outer retinal atrophy, no SRF, no IRF, retinal drusen .   Notes OD, anatomy is unchanged over an 8-week period now for many years.  Ongoing CME needs to be controlled to protect and preserve current visual functioning,  will repeat intravitreal Avastin today       Intravitreal Injection, Pharmacologic Agent - OD - Right Eye       Time Out 12/26/2019. 11:47 AM. Confirmed correct patient, procedure, site,  and patient consented.   Anesthesia Topical anesthesia was used. Anesthetic medications included Akten 3.5%.   Procedure Preparation included Tobramycin 0.3%, 10% betadine to eyelids, 5% betadine to ocular surface. A supplied needle was used.   Injection:  1.25 mg Bevacizumab (AVASTIN) SOLN   NDC: [address]-1, Lot: [id number]   Route: Intravitreal, Site: Right Eye, Waste: 0 mg  Post-op Post injection exam found visual acuity of at least counting fingers. The patient tolerated the procedure well. There were no complications. The patient received written and verbal post procedure care education. Post injection medications were not given.                 ASSESSMENT/PLAN:  Exudative age-related macular degeneration of right eye with active choroidal neovascularization (HCC) The nature of wet macular degeneration was discussed with the patient.  Forms of therapy reviewed include the use of Anti-VEGF medications injected painlessly into the eye, as well as other possible treatment modalities, including thermal laser therapy. Fellow eye involvement and risks were discussed with the patient. Upon the finding of wet age related macular degeneration, treatment will be offered. The treatment regimen is on a treat as needed basis with the intent to treat if necessary and extend interval of exams when possible. On average 1 out of 6 patients do not need lifetime therapy. However, the risk of recurrent disease is high for a lifetime.  Initially monthly, then periodic, examinations and evaluations will determine whether the next treatment is required on the day of the examination.  OD, chronically active with subfoveal disciform scar, fibrotic PED with intraretinal fluid, CME.  Improved yet stable at 8-week  examination.  Will repeat treat today and exam in 8 weeks      ICD-10-CM   1. Exudative age-related macular degeneration of right eye with active choroidal neovascularization (HCC)  H35.3211 OCT, Retina - OU - Both Eyes    Intravitreal Injection, Pharmacologic Agent - OD - Right Eye    Bevacizumab (AVASTIN) SOLN 1.25 mg    1.  Repeat injection intravitreal Avastin OD today to maintain and protect of current visual functioning for disciform scar juxta foveal  2.  3.  Ophthalmic Meds Ordered this visit:  Meds ordered this encounter  Medications  . Bevacizumab (AVASTIN) SOLN 1.25 mg       Return in about 8 weeks (around 02/20/2020) for dilate, OD, AVASTIN OCT.  Patient Instructions  Patient to notify us promptly should new onset distortion or acuity decline develop    Explained the diagnoses, plan, and follow up with the patient and they expressed understanding.  Patient expressed understanding of the importance of proper follow up care.   Clent Demark Donicia Druck M.D. Diseases & Surgery of the Retina and Vitreous [facility] 12/26/19     Abbreviations: M myopia (nearsighted); A astigmatism; H hyperopia (farsighted); P presbyopia; Mrx spectacle prescription;  CTL contact lenses; OD right eye; OS left eye; OU both eyes  XT exotropia; ET esotropia; PEK punctate epithelial keratitis; PEE punctate epithelial erosions; DES dry eye syndrome; MGD meibomian gland dysfunction; ATs artificial tears; PFAT's preservative free artificial tears; [place] nuclear sclerotic cataract; PSC posterior subcapsular cataract; ERM epi-retinal membrane; PVD posterior vitreous detachment; RD retinal detachment; DM diabetes mellitus; DR diabetic retinopathy; NPDR non-proliferative diabetic retinopathy; PDR proliferative diabetic retinopathy; CSME clinically significant macular edema; DME diabetic macular edema; dbh dot blot hemorrhages; CWS cotton wool spot; POAG primary open angle glaucoma; C/D  cup-to-disc ratio; HVF humphrey visual field; GVF goldmann visual field;  OCT optical coherence tomography; IOP intraocular pressure; BRVO Branch retinal vein occlusion; CRVO central retinal vein occlusion; CRAO central retinal artery occlusion; BRAO branch retinal artery occlusion; RT retinal tear; SB scleral buckle; PPV pars plana vitrectomy; VH Vitreous hemorrhage; PRP panretinal laser photocoagulation; IVK intravitreal kenalog; VMT vitreomacular traction; MH Macular hole;  NVD neovascularization of the disc; NVE neovascularization elsewhere; AREDS age related eye disease study; ARMD age related macular degeneration; POAG primary open angle glaucoma; EBMD epithelial/anterior basement membrane dystrophy; ACIOL anterior chamber intraocular lens; IOL intraocular lens; PCIOL posterior chamber intraocular lens; Phaco/IOL phacoemulsification with intraocular lens placement; [place] photorefractive keratectomy; LASIK laser assisted in situ keratomileusis; HTN hypertension; DM diabetes mellitus; COPD chronic obstructive pulmonary disease

## 2019-12-26 NOTE — Patient Instructions (Signed)
Patient to notify us promptly should new onset distortion or acuity decline develop

## 2019-12-28 ENCOUNTER — Other Ambulatory Visit: Payer: Self-pay | Admitting: Family Medicine

## 2020-01-01 ENCOUNTER — Telehealth: Payer: Self-pay | Admitting: Family Medicine

## 2020-01-01 NOTE — Telephone Encounter (Signed)
Spoke to husband --according to last note patient was to increase namenda 10mg  2 x per day

## 2020-01-01 NOTE — Telephone Encounter (Signed)
Pt husband called wanting to know if Pt was to stop taking memantine (NAMENDA) 10 MG tablet. He was thinking the Dr was telling her to stop taking it.   Pt [phone] Hendricks Milo) husband

## 2020-01-31 ENCOUNTER — Other Ambulatory Visit: Payer: Self-pay | Admitting: Family Medicine

## 2020-02-04 ENCOUNTER — Other Ambulatory Visit: Payer: Self-pay | Admitting: Family Medicine

## 2020-02-25 ENCOUNTER — Encounter (INDEPENDENT_AMBULATORY_CARE_PROVIDER_SITE_OTHER): Payer: Self-pay | Admitting: Ophthalmology

## 2020-02-25 ENCOUNTER — Other Ambulatory Visit: Payer: Self-pay

## 2020-02-25 ENCOUNTER — Ambulatory Visit (INDEPENDENT_AMBULATORY_CARE_PROVIDER_SITE_OTHER): Payer: Medicare Other | Admitting: Ophthalmology

## 2020-02-25 DIAGNOSIS — H353112 Nonexudative age-related macular degeneration, right eye, intermediate dry stage: Secondary | ICD-10-CM | POA: Diagnosis not present

## 2020-02-25 DIAGNOSIS — H353211 Exudative age-related macular degeneration, right eye, with active choroidal neovascularization: Secondary | ICD-10-CM

## 2020-02-25 DIAGNOSIS — H43811 Vitreous degeneration, right eye: Secondary | ICD-10-CM | POA: Diagnosis not present

## 2020-02-25 MED ORDER — BEVACIZUMAB CHEMO INJECTION 1.25MG/0.05ML SYRINGE FOR KALEIDOSCOPE
1.2500 mg | INTRAVITREAL | Status: AC | PRN
  Administered 2020-02-25: 1.25 mg via INTRAVITREAL

## 2020-02-25 NOTE — Patient Instructions (Signed)
Patient instructed to promptly notify the office of new onset visual acuity decline or distortions

## 2020-02-25 NOTE — Assessment & Plan Note (Signed)
Accounts for acuity 

## 2020-02-25 NOTE — Assessment & Plan Note (Signed)
Disciform scar, with large mature CNVM as a new vascular blood supply to the outer retina with preserved visual functioning and persistent CME.  Stabilize and maintain an 8-week interval of examination and delivery of therapy

## 2020-02-25 NOTE — Progress Notes (Signed)
02/25/2020     CHIEF COMPLAINT Patient presents for Retina Follow Up   HISTORY OF PRESENT ILLNESS: Susan Mcneil is a 74 y.o. female who presents to the clinic today for:   HPI    Retina Follow Up    Patient presents with  Wet AMD.  In right eye.  Severity is moderate.  Duration of 8 weeks.  Since onset it is stable.  I, the attending physician,  performed the HPI with the patient and updated documentation appropriately.          Comments    8 Week Wet AMD f\u OD, Possible Avastin OD, OCT  Pt states no changes in vision. Denies any new complaints.       Last edited by Tilda Franco on 02/25/2020 10:54 AM. (History)      Referring physician: Kathyrn Drown, MD [address] [place],  [place] [address]  HISTORICAL INFORMATION:   Selected notes from the MEDICAL RECORD NUMBER       CURRENT MEDICATIONS: Current Outpatient Medications (Ophthalmic Drugs)  Medication Sig  . Travoprost, BAK Free, (TRAVATAN) 0.004 % SOLN ophthalmic solution Place 1 drop into the right eye at bedtime.    No current facility-administered medications for this visit. (Ophthalmic Drugs)   Current Outpatient Medications (Other)  Medication Sig  . Calcium Citrate (CITRACAL PO) Take 1 tablet by mouth daily.   . Coenzyme Q10 (COQ10 PO) Take 1 capsule by mouth daily.  Marland Kitchen donepezil (ARICEPT) 5 MG tablet Take one tablet by mouth daily  . famotidine (PEPCID) 40 MG tablet TAKE 1 TABLET(40 MG) BY MOUTH AT BEDTIME (Patient not taking: Reported on 10/15/2019)  . levothyroxine (SYNTHROID) 100 MCG tablet TAKE 1 TABLET(100 MCG) BY MOUTH DAILY  . memantine (NAMENDA) 10 MG tablet Take 1 tablet (10 mg total) by mouth 2 (two) times daily.  . rosuvastatin (CRESTOR) 10 MG tablet TAKE 1 TABLET(10 MG) BY MOUTH AT BEDTIME   No current facility-administered medications for this visit. (Other)      REVIEW OF SYSTEMS:    ALLERGIES No Known Allergies  PAST MEDICAL HISTORY Past Medical History:    Diagnosis Date  . Alzheimer's dementia ([place]) 11/21/2019  . 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 Fatty liver   . HTN (hypertension)    cholesterol & thyroid  . Hypothyroidism   . Kidney stone   . Macular degeneration   . Macular degeneration, bilateral   . Memory disturbance   . 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 Osteopenia   . Osteoporosis   . Reflux gastritis   . Thyroid disorder    Past Surgical History:  Procedure Laterality Date  . ABDOMINAL HYSTERECTOMY   patial, pre-uterine cancer, 1974  . BIOPSY  10/27/2016   Procedure: BIOPSY;  Surgeon: Rogene Houston, MD;  [facility];  Service: Endoscopy;;  esophagus  . BIOPSY  01/17/2018   Procedure: BIOPSY;  Surgeon: Rogene Houston, MD;  [facility];  Service: Endoscopy;;  colon  . COLONOSCOPY N/A 04/01/2015   Procedure: COLONOSCOPY;  Surgeon: Rogene Houston, MD;  [facility];  Service: Endoscopy;  Laterality: N/A;  1030  . COLONOSCOPY N/A 01/17/2018   Procedure: COLONOSCOPY;  Surgeon: Rogene Houston, MD;  [facility];  Service: Endoscopy;  Laterality: N/A;  9:30  . DIAGNOSTIC LAPAROSCOPY    . ESOPHAGOGASTRODUODENOSCOPY N/A 10/27/2016   Procedure: ESOPHAGOGASTRODUODENOSCOPY (EGD);  Surgeon: Rogene Houston, MD;  [facility];  Service: Endoscopy;  Laterality: N/A;  100  . TOTAL ABDOMINAL HYSTERECTOMY W/ BILATERAL SALPINGOOPHORECTOMY      FAMILY HISTORY Family History  Problem Relation Age of Onset  . Cancer Mother   . Stroke Father     SOCIAL HISTORY Social History   Tobacco Use  . Smoking status: Former Smoker    Quit date: 12/07/2000    Years since quitting: 19.2  . Smokeless tobacco: Never Used  Substance Use Topics  . Alcohol use: No  . Drug use: No         OPHTHALMIC  EXAM: Base Eye Exam    Visual Acuity (Snellen - Linear)      Right Left   Dist [place] 20/40 20/25 -1   Dist ph [place] 20/30 -2        Tonometry (Tonopen, 10:58 AM)      Right Left   Pressure 17 18       Pupils      Pupils Dark Light Shape React APD   Right PERRL 3 3 Round Minimal None   Left PERRL 3 3 Round Minimal None       Visual Fields (Counting fingers)      Left Right    Full Full       Neuro/Psych    Oriented x3: Yes   Mood/Affect: Normal       Dilation    Right eye: 1.0% Mydriacyl, 2.5% Phenylephrine @ 10:58 AM        Slit Lamp and Fundus Exam    External Exam      Right Left   External Normal Normal       Slit Lamp Exam      Right Left   Lids/Lashes Normal Normal   Conjunctiva/Sclera White and quiet White and quiet   Cornea Clear Clear   Anterior Chamber Deep and quiet Deep and quiet   Iris Round and reactive Round and reactive   Lens Posterior chamber intraocular lens Posterior chamber intraocular lens   Anterior Vitreous Normal Normal       Fundus Exam      Right Left   Posterior Vitreous Posterior vitreous detachment    Disc Normal    C/D Ratio 0.7    Macula Atrophy, Geographic atrophy, Disciform scar with dark red large neovascular trunk which is mature new blood supply to the fovea,, Cystoid macular edema, Macular thickening, no exudates, no hemorrhage    Vessels Normal    Periphery Normal           IMAGING AND PROCEDURES  Imaging and Procedures for 02/25/20  OCT, Retina - OU - Both Eyes       Right Eye Quality was good. Scan locations included subfoveal. Central Foveal Thickness: 338. Findings include disciform scar, cystoid macular edema, no SRF, retinal drusen .   Left Eye Quality was good. Scan locations included subfoveal. Central Foveal Thickness: 271. Progression has been stable. Findings include central retinal atrophy, outer retinal atrophy, no SRF, no IRF, retinal drusen .   Notes OD, anatomy is unchanged over an 8-week  period now for many years.  Ongoing CME needs to be controlled to protect and preserve current visual functioning, will repeat intravitreal Avastin today       Intravitreal Injection, Pharmacologic Agent - OD - Right Eye  Time Out 02/25/2020. 11:41 AM. Confirmed correct patient, procedure, site, and patient consented.   Anesthesia Topical anesthesia was used. Anesthetic medications included [place] 3.5%.   Procedure Preparation included Tobramycin 0.3%, 10% betadine to eyelids, 5% betadine to ocular surface. A 30 gauge needle was used.   Injection:  1.25 mg Bevacizumab (AVASTIN) SOLN   NDC: 70360-001-02, Lot: [id number]   Route: Intravitreal, Site: Right Eye, Waste: 0 mg  Post-op Post injection exam found visual acuity of at least counting fingers. The patient tolerated the procedure well. There were no complications. The patient received written and verbal post procedure care education. Post injection medications were not given.                 ASSESSMENT/PLAN:  Posterior vitreous detachment of right eye   The nature of posterior vitreous detachment was discussed with the patient as well as its physiology, its age prevalence, and its possible implication regarding retinal breaks and detachment.  An informational brochure was given to the patient.  All the patient's questions were answered.  The patient was asked to return if new or different flashes or floaters develops.   Patient was instructed to contact office immediately if any changes were noticed. I explained to the patient that vitreous inside the eye is similar to jello inside a bowl. As the jello melts it can start to pull away from the bowl, similarly the vitreous throughout our lives can begin to pull away from the retina. That process is called a posterior vitreous detachment. In some cases, the vitreous can tug hard enough on the retina to form a retinal tear. I discussed with the patient the signs and symptoms of a  retinal detachment.  Do not rub the eye.  Intermediate stage nonexudative age-related macular degeneration of right eye Accounts for acuity  Exudative age-related macular degeneration of right eye with active choroidal neovascularization (HCC) Disciform scar, with large mature CNVM as a new vascular blood supply to the outer retina with preserved visual functioning and persistent CME.  Stabilize and maintain an 8-week interval of examination and delivery of therapy      ICD-10-CM   1. Exudative age-related macular degeneration of right eye with active choroidal neovascularization (HCC)  H35.3211 OCT, Retina - OU - Both Eyes    Intravitreal Injection, Pharmacologic Agent - OD - Right Eye    Bevacizumab (AVASTIN) SOLN 1.25 mg  2. Posterior vitreous detachment of right eye  H43.811   3. Intermediate stage nonexudative age-related macular degeneration of right eye  H35.3112     1.  Repeat injection intravitreal Avastin OD today to maintain acuity and prevent progression of CNVM active subfoveal location  2.  3.  Ophthalmic Meds Ordered this visit:  Meds ordered this encounter  Medications  . Bevacizumab (AVASTIN) SOLN 1.25 mg       Return in about 8 weeks (around 04/21/2020) for DILATE OU, AVASTIN OCT, OD.  There are no Patient Instructions on file for this visit.   Explained the diagnoses, plan, and follow up with the patient and they expressed understanding.  Patient expressed understanding of the importance of proper follow up care.   Clent Demark Tru Rana M.D. Diseases & Surgery of the Retina and Vitreous Retina [facility] Diabetic [place] 02/25/20     Abbreviations: M myopia (nearsighted); A astigmatism; H hyperopia (farsighted); P presbyopia; Mrx spectacle prescription;  CTL contact lenses; OD right eye; OS left eye; OU both eyes  XT exotropia; ET esotropia; PEK punctate epithelial keratitis;  PEE punctate epithelial erosions; DES dry eye syndrome; MGD meibomian gland dysfunction;  ATs artificial tears; PFAT's preservative free artificial tears; [place] nuclear sclerotic cataract; PSC posterior subcapsular cataract; ERM epi-retinal membrane; PVD posterior vitreous detachment; RD retinal detachment; DM diabetes mellitus; DR diabetic retinopathy; NPDR non-proliferative diabetic retinopathy; PDR proliferative diabetic retinopathy; CSME clinically significant macular edema; DME diabetic macular edema; dbh dot blot hemorrhages; CWS cotton wool spot; POAG primary open angle glaucoma; C/D cup-to-disc ratio; HVF humphrey visual field; GVF goldmann visual field; OCT optical coherence tomography; IOP intraocular pressure; BRVO Branch retinal vein occlusion; CRVO central retinal vein occlusion; CRAO central retinal artery occlusion; BRAO branch retinal artery occlusion; RT retinal tear; SB scleral buckle; PPV pars plana vitrectomy; VH Vitreous hemorrhage; PRP panretinal laser photocoagulation; IVK intravitreal kenalog; VMT vitreomacular traction; MH Macular hole;  NVD neovascularization of the disc; NVE neovascularization elsewhere; AREDS age related eye disease study; ARMD age related macular degeneration; POAG primary open angle glaucoma; EBMD epithelial/anterior basement membrane dystrophy; ACIOL anterior chamber intraocular lens; IOL intraocular lens; PCIOL posterior chamber intraocular lens; Phaco/IOL phacoemulsification with intraocular lens placement; [place] photorefractive keratectomy; LASIK laser assisted in situ keratomileusis; HTN hypertension; DM diabetes mellitus; COPD chronic obstructive pulmonary disease

## 2020-02-25 NOTE — Assessment & Plan Note (Signed)

## 2020-02-27 ENCOUNTER — Other Ambulatory Visit: Payer: Self-pay

## 2020-02-27 ENCOUNTER — Ambulatory Visit (INDEPENDENT_AMBULATORY_CARE_PROVIDER_SITE_OTHER): Payer: Medicare Other | Admitting: Family Medicine

## 2020-02-27 VITALS — BP 124/76 | HR 91 | Temp 97.8°F | Wt 170.0 lb

## 2020-02-27 DIAGNOSIS — Z23 Encounter for immunization: Secondary | ICD-10-CM

## 2020-02-27 DIAGNOSIS — E038 Other specified hypothyroidism: Secondary | ICD-10-CM

## 2020-02-27 DIAGNOSIS — G308 Other Alzheimer's disease: Secondary | ICD-10-CM

## 2020-02-27 DIAGNOSIS — F0280 Dementia in other diseases classified elsewhere without behavioral disturbance: Secondary | ICD-10-CM

## 2020-02-27 DIAGNOSIS — E782 Mixed hyperlipidemia: Secondary | ICD-10-CM | POA: Diagnosis not present

## 2020-02-27 NOTE — Progress Notes (Signed)
   Subjective:    Patient ID: Susan Mcneil, female    DOB: [birth year], 74 y.o.   MRN: [id number]  Hyperlipidemia This is a chronic problem. Pertinent negatives include no chest pain or shortness of breath. Treatments tried: takes meds every day, walks for exercise, eats healhty.   Pt states no concerns today.  Patient does tend to eat out a lot but tries to eat healthy Does a little bit of walking in her driveway but does not do any regular exercise States she is overall able to get by her husband helps her a lot she does have moderate Alzheimer's she does not drive currently. Would like a senior flu vaccine today.    Review of Systems  Constitutional: Negative for activity change, appetite change and fatigue.  HENT: Negative for congestion and rhinorrhea.   Respiratory: Negative for cough and shortness of breath.   Cardiovascular: Negative for chest pain and leg swelling.  Gastrointestinal: Negative for abdominal pain and diarrhea.  Endocrine: Negative for polydipsia and polyphagia.  Skin: Negative for color change.  Neurological: Negative for dizziness and weakness.  Psychiatric/Behavioral: Negative for behavioral problems and confusion.       Objective:   Physical Exam Vitals reviewed.  Constitutional:      General: She is not in acute distress. HENT:     Head: Normocephalic and atraumatic.  Eyes:     General:        Right eye: No discharge.        Left eye: No discharge.  Neck:     Trachea: No tracheal deviation.  Cardiovascular:     Rate and Rhythm: Normal rate and regular rhythm.     Heart sounds: Normal heart sounds. No murmur heard.   Pulmonary:     Effort: Pulmonary effort is normal. No respiratory distress.     Breath sounds: Normal breath sounds.  Lymphadenopathy:     Cervical: No cervical adenopathy.  Skin:    General: Skin is warm and dry.  Neurological:     Mental Status: She is alert.     Coordination: Coordination normal.  Psychiatric:         Behavior: Behavior normal.           Assessment & Plan:  1. Other specified hypothyroidism Continue medication check lab work  2. Mixed hyperlipidemia Continue medication watch diet closely  3. Need for vaccination Flu shot today - Flu Vaccine QUAD High Dose(Fluad) Alzheimer's stable, continue to let her husband drive.  Medications written out refills given

## 2020-02-28 LAB — LIPID PANEL
Chol/HDL Ratio: 2.8 ratio (ref 0.0–4.4)
Cholesterol, Total: 173 mg/dL (ref 100–199)
HDL: 62 mg/dL (ref >39)
LDL Chol Calc (NIH): 85 mg/dL (ref 0–99)
Triglycerides: 152 mg/dL — ABNORMAL HIGH (ref 0–149)
VLDL Cholesterol Cal: 26 mg/dL (ref 5–40)

## 2020-02-28 LAB — TSH: TSH: 0.058 u[IU]/mL — ABNORMAL LOW (ref 0.450–4.500)

## 2020-02-28 LAB — T4, FREE: Free T4: 1.55 ng/dL (ref 0.82–1.77)

## 2020-03-09 ENCOUNTER — Other Ambulatory Visit: Payer: Self-pay | Admitting: *Deleted

## 2020-03-09 DIAGNOSIS — E039 Hypothyroidism, unspecified: Secondary | ICD-10-CM

## 2020-03-09 MED ORDER — LEVOTHYROXINE SODIUM 88 MCG PO TABS: 88.0000 ug | ORAL_TABLET | Freq: Every day | ORAL | 5 refills | Status: DC

## 2020-04-07 ENCOUNTER — Other Ambulatory Visit (INDEPENDENT_AMBULATORY_CARE_PROVIDER_SITE_OTHER): Payer: Medicare Other

## 2020-04-07 DIAGNOSIS — E538 Deficiency of other specified B group vitamins: Secondary | ICD-10-CM

## 2020-04-07 MED ORDER — CYANOCOBALAMIN 1000 MCG/ML IJ SOLN
1000.0000 ug | Freq: Once | INTRAMUSCULAR | Status: AC
  Administered 2020-04-07: 1000 ug via INTRAMUSCULAR

## 2020-04-21 ENCOUNTER — Encounter (INDEPENDENT_AMBULATORY_CARE_PROVIDER_SITE_OTHER): Payer: Medicare Other | Admitting: Ophthalmology

## 2020-04-22 ENCOUNTER — Encounter (INDEPENDENT_AMBULATORY_CARE_PROVIDER_SITE_OTHER): Payer: Self-pay | Admitting: Ophthalmology

## 2020-04-22 ENCOUNTER — Other Ambulatory Visit: Payer: Self-pay

## 2020-04-22 ENCOUNTER — Ambulatory Visit (INDEPENDENT_AMBULATORY_CARE_PROVIDER_SITE_OTHER): Payer: Medicare Other | Admitting: Ophthalmology

## 2020-04-22 DIAGNOSIS — H35351 Cystoid macular degeneration, right eye: Secondary | ICD-10-CM

## 2020-04-22 DIAGNOSIS — H43812 Vitreous degeneration, left eye: Secondary | ICD-10-CM | POA: Diagnosis not present

## 2020-04-22 DIAGNOSIS — H353211 Exudative age-related macular degeneration, right eye, with active choroidal neovascularization: Secondary | ICD-10-CM

## 2020-04-22 MED ORDER — BEVACIZUMAB 2.5 MG/0.1ML IZ SOSY
2.5000 mg | PREFILLED_SYRINGE | INTRAVITREAL | Status: AC | PRN
  Administered 2020-04-22: 10:00:00 2.5 mg via INTRAVITREAL

## 2020-04-22 NOTE — Assessment & Plan Note (Signed)

## 2020-04-22 NOTE — Progress Notes (Signed)
04/22/2020     CHIEF COMPLAINT Patient presents for Retina Follow Up (32 WK FU OU, POSS AVASTIN OD////Pt reports blurry vision OD, no new f/f, no pain or pressure. )   HISTORY OF PRESENT ILLNESS: Susan Mcneil is a 74 y.o. female who presents to the clinic today for:   HPI    Retina Follow Up    Patient presents with  Wet AMD.  In right eye.  This started 2 months ago.  Severity is mild.  Duration of 2 months.  Since onset it is gradually worsening. Additional comments: 8 WK FU OU, POSS AVASTIN OD    Pt reports blurry vision OD, no new f/f, no pain or pressure.        Last edited by Nichola Sizer D on 04/22/2020  9:06 AM. (History)      Referring physician: Kathyrn Drown, MD [address] [place],  [place] [address]  HISTORICAL INFORMATION:   Selected notes from the MEDICAL RECORD NUMBER       CURRENT MEDICATIONS: No current outpatient medications on file. (Ophthalmic Drugs)   No current facility-administered medications for this visit. (Ophthalmic Drugs)   Current Outpatient Medications (Other)  Medication Sig  . donepezil (ARICEPT) 5 MG tablet Take one tablet by mouth daily  . levothyroxine (SYNTHROID) 88 MCG tablet Take 1 tablet (88 mcg total) by mouth daily.  . memantine (NAMENDA) 10 MG tablet Take 1 tablet (10 mg total) by mouth 2 (two) times daily.  . rosuvastatin (CRESTOR) 10 MG tablet TAKE 1 TABLET(10 MG) BY MOUTH AT BEDTIME   No current facility-administered medications for this visit. (Other)      REVIEW OF SYSTEMS:    ALLERGIES No Known Allergies  PAST MEDICAL HISTORY Past Medical History:  Diagnosis Date  . Alzheimer's dementia ([place]) 11/21/2019  . 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 Fatty liver   . HTN (hypertension)    cholesterol & thyroid  . Hypothyroidism   .  Kidney stone   . Macular degeneration   . Macular degeneration, bilateral   . Memory disturbance   . 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 Osteopenia   . Osteoporosis   . Reflux gastritis   . Thyroid disorder    Past Surgical History:  Procedure Laterality Date  . ABDOMINAL HYSTERECTOMY   patial, pre-uterine cancer, 1974  . BIOPSY  10/27/2016   Procedure: BIOPSY;  Surgeon: Rogene Houston, MD;  [facility];  Service: Endoscopy;;  esophagus  . BIOPSY  01/17/2018   Procedure: BIOPSY;  Surgeon: Rogene Houston, MD;  [facility];  Service: Endoscopy;;  colon  . COLONOSCOPY N/A 04/01/2015   Procedure: COLONOSCOPY;  Surgeon: Rogene Houston, MD;  [facility];  Service: Endoscopy;  Laterality: N/A;  1030  . COLONOSCOPY N/A 01/17/2018   Procedure: COLONOSCOPY;  Surgeon: Rogene Houston, MD;  [facility];  Service: Endoscopy;  Laterality: N/A;  9:30  . DIAGNOSTIC LAPAROSCOPY    . ESOPHAGOGASTRODUODENOSCOPY N/A 10/27/2016   Procedure: ESOPHAGOGASTRODUODENOSCOPY (EGD);  Surgeon: Rogene Houston, MD;  [facility];  Service: Endoscopy;  Laterality: N/A;  100  . TOTAL ABDOMINAL HYSTERECTOMY W/ BILATERAL SALPINGOOPHORECTOMY      FAMILY HISTORY Family History  Problem Relation Age of Onset  .  Cancer Mother   . Stroke Father     SOCIAL HISTORY Social History   Tobacco Use  . Smoking status: Former Smoker    Quit date: 12/07/2000    Years since quitting: 19.3  . Smokeless tobacco: Never Used  Substance Use Topics  . Alcohol use: No  . Drug use: No         OPHTHALMIC EXAM: Base Eye Exam    Visual Acuity (ETDRS)      Right Left   Dist [place] 20/40 +2 20/20   Dist ph [place] 20/30 -2        Tonometry (Tonopen, 9:13 AM)      Right Left   Pressure 15 17       Pupils      Pupils Dark Light Shape React APD   Right PERRL 3 3 Round Minimal None   Left PERRL 3 3 Round Minimal None       Visual Fields  (Counting fingers)      Left Right    Full Full       Extraocular Movement      Right Left    Full Full       Neuro/Psych    Oriented x3: Yes   Mood/Affect: Normal       Dilation    Both eyes: 1.0% Mydriacyl, 2.5% Phenylephrine @ 9:13 AM        Slit Lamp and Fundus Exam    External Exam      Right Left   External Normal Normal       Slit Lamp Exam      Right Left   Lids/Lashes Normal Normal   Conjunctiva/Sclera White and quiet White and quiet   Cornea Clear Clear   Anterior Chamber Deep and quiet Deep and quiet   Iris Round and reactive Round and reactive   Lens Posterior chamber intraocular lens Posterior chamber intraocular lens   Anterior Vitreous Normal Normal       Fundus Exam      Right Left   Posterior Vitreous Posterior vitreous detachment    Disc Normal Normal   C/D Ratio 0.7 0.65   Macula Atrophy, Geographic atrophy, Disciform scar with dark red large neovascular trunk which is mature new blood supply to the fovea,, Cystoid macular edema, Macular thickening, no exudates, no hemorrhage Intermediate age related macular degeneration, no exudates, no hemorrhage, no macular thickening   Vessels Normal Normal   Periphery Normal Normal          IMAGING AND PROCEDURES  Imaging and Procedures for 04/22/20  OCT, Retina - OU - Both Eyes       Right Eye Quality was good. Scan locations included subfoveal. Central Foveal Thickness: 313. Progression has improved. Findings include cystoid macular edema, pigment epithelial detachment.   Left Eye Quality was good. Scan locations included subfoveal. Central Foveal Thickness: 2801. Progression has been stable. Findings include pigment epithelial detachment.   Notes OD with chronic cystoid intraretinal fluid change overlying a vascularized pigment epithelial detachment. Currently stable 8-week interval of examination post Avastin will repeat injection today  OS no signs of active CNVM, incidental posterior  vitreous detachment is visualized       Intravitreal Injection, Pharmacologic Agent - OD - Right Eye       Time Out 04/22/2020. 9:56 AM. Confirmed correct patient, procedure, site, and patient consented.   Anesthesia Topical anesthesia was used. Anesthetic medications included [place] 3.5%.   Procedure Preparation included Tobramycin 0.3%, 10%  betadine to eyelids, 5% betadine to ocular surface. A 30 gauge needle was used.   Injection:  2.5 mg Bevacizumab (AVASTIN) 2.5mg /0.61mL SOSY   NDC: 83662-947-65, Lot: [id number]   Route: Intravitreal, Site: Right Eye  Post-op Post injection exam found visual acuity of at least counting fingers. The patient tolerated the procedure well. There were no complications. The patient received written and verbal post procedure care education. Post injection medications were not given.                 ASSESSMENT/PLAN:  Posterior vitreous detachment of left eye   The nature of posterior vitreous detachment was discussed with the patient as well as its physiology, its age prevalence, and its possible implication regarding retinal breaks and detachment.  An informational brochure was given to the patient.  All the patient's questions were answered.  The patient was asked to return if new or different flashes or floaters develops.   Patient was instructed to contact office immediately if any changes were noticed. I explained to the patient that vitreous inside the eye is similar to jello inside a bowl. As the jello melts it can start to pull away from the bowl, similarly the vitreous throughout our lives can begin to pull away from the retina. That process is called a posterior vitreous detachment. In some cases, the vitreous can tug hard enough on the retina to form a retinal tear. I discussed with the patient the signs and symptoms of a retinal detachment.  Do not rub the eye.  Exudative age-related macular degeneration of right eye with active choroidal  neovascularization (HCC) The nature of wet macular degeneration was discussed with the patient.  Forms of therapy reviewed include the use of Anti-VEGF medications injected painlessly into the eye, as well as other possible treatment modalities, including thermal laser therapy. Fellow eye involvement and risks were discussed with the patient. Upon the finding of wet age related macular degeneration, treatment will be offered. The treatment regimen is on a treat as needed basis with the intent to treat if necessary and extend interval of exams when possible. On average 1 out of 6 patients do not need lifetime therapy. However, the risk of recurrent disease is high for a lifetime.  Initially monthly, then periodic, examinations and evaluations will determine whether the next treatment is required on the day of the examination.  OD currently stable at 8-week interval  Cystoid macular edema of right eye Chronic, cavitary, not likely to ever clear yet adequate marker to look for worsening if it develops in the future      ICD-10-CM   1. Exudative age-related macular degeneration of right eye with active choroidal neovascularization (HCC)  H35.3211 OCT, Retina - OU - Both Eyes    Intravitreal Injection, Pharmacologic Agent - OD - Right Eye    bevacizumab (AVASTIN) SOSY 2.5 mg  2. Posterior vitreous detachment of left eye  H43.812   3. Cystoid macular edema of right eye  H35.351     1. Stable acuity and improved macular findings overall OD currently at 8-week follow-up interval OD repeat injection Avastin today and examination OD only in 8 weeks  2. Eye examination left eye today confirms no signs of CNVM or complications of ARMD and stable acuity  3.  Ophthalmic Meds Ordered this visit:  Meds ordered this encounter  Medications  . bevacizumab (AVASTIN) SOSY 2.5 mg       Return in about 8 weeks (around 06/17/2020) for dilate, OD, AVASTIN  OCT.  There are no Patient Instructions on file for  this visit.   Explained the diagnoses, plan, and follow up with the patient and they expressed understanding.  Patient expressed understanding of the importance of proper follow up care.   Clent Demark Hamza Empson M.D. Diseases & Surgery of the Retina and Vitreous [facility] Diabetic [place] 04/22/20     Abbreviations: M myopia (nearsighted); A astigmatism; H hyperopia (farsighted); P presbyopia; Mrx spectacle prescription;  CTL contact lenses; OD right eye; OS left eye; OU both eyes  XT exotropia; ET esotropia; PEK punctate epithelial keratitis; PEE punctate epithelial erosions; DES dry eye syndrome; MGD meibomian gland dysfunction; ATs artificial tears; PFAT's preservative free artificial tears; [place] nuclear sclerotic cataract; PSC posterior subcapsular cataract; ERM epi-retinal membrane; PVD posterior vitreous detachment; RD retinal detachment; DM diabetes mellitus; DR diabetic retinopathy; NPDR non-proliferative diabetic retinopathy; PDR proliferative diabetic retinopathy; CSME clinically significant macular edema; DME diabetic macular edema; dbh dot blot hemorrhages; CWS cotton wool spot; POAG primary open angle glaucoma; C/D cup-to-disc ratio; HVF humphrey visual field; GVF goldmann visual field; OCT optical coherence tomography; IOP intraocular pressure; BRVO Branch retinal vein occlusion; CRVO central retinal vein occlusion; CRAO central retinal artery occlusion; BRAO branch retinal artery occlusion; RT retinal tear; SB scleral buckle; PPV pars plana vitrectomy; VH Vitreous hemorrhage; PRP panretinal laser photocoagulation; IVK intravitreal kenalog; VMT vitreomacular traction; MH Macular hole;  NVD neovascularization of the disc; NVE neovascularization elsewhere; AREDS age related eye disease study; ARMD age related macular degeneration; POAG primary open angle glaucoma; EBMD epithelial/anterior basement membrane dystrophy; ACIOL anterior chamber intraocular lens; IOL intraocular lens; PCIOL posterior  chamber intraocular lens; Phaco/IOL phacoemulsification with intraocular lens placement; [place] photorefractive keratectomy; LASIK laser assisted in situ keratomileusis; HTN hypertension; DM diabetes mellitus; COPD chronic obstructive pulmonary disease

## 2020-04-22 NOTE — Assessment & Plan Note (Signed)
Chronic, cavitary, not likely to ever clear yet adequate marker to look for worsening if it develops in the future

## 2020-04-22 NOTE — Assessment & Plan Note (Addendum)
The nature of wet macular degeneration was discussed with the patient.  Forms of therapy reviewed include the use of Anti-VEGF medications injected painlessly into the eye, as well as other possible treatment modalities, including thermal laser therapy. Fellow eye involvement and risks were discussed with the patient. Upon the finding of wet age related macular degeneration, treatment will be offered. The treatment regimen is on a treat as needed basis with the intent to treat if necessary and extend interval of exams when possible. On average 1 out of 6 patients do not need lifetime therapy. However, the risk of recurrent disease is high for a lifetime.  Initially monthly, then periodic, examinations and evaluations will determine whether the next treatment is required on the day of the examination.  OD currently stable at 8-week interval

## 2020-04-27 DIAGNOSIS — Z23 Encounter for immunization: Secondary | ICD-10-CM | POA: Diagnosis not present

## 2020-05-15 ENCOUNTER — Telehealth: Payer: Self-pay

## 2020-05-15 DIAGNOSIS — E538 Deficiency of other specified B group vitamins: Secondary | ICD-10-CM

## 2020-05-15 DIAGNOSIS — E039 Hypothyroidism, unspecified: Secondary | ICD-10-CM

## 2020-05-15 DIAGNOSIS — E782 Mixed hyperlipidemia: Secondary | ICD-10-CM

## 2020-05-15 DIAGNOSIS — I10 Essential (primary) hypertension: Secondary | ICD-10-CM

## 2020-05-15 NOTE — Telephone Encounter (Signed)
Patient should do lab work by the time she follows up in March with a METS 7, TSH, free T4, lipid, B12  Hyperlipidemia, B12 deficiency, hypothyroidism  As for the B12 she can go ahead and do a nurse visit 1 mL B12 IM monthly

## 2020-05-15 NOTE — Telephone Encounter (Signed)
Pt called and wants to schedule for B-12 had not had shot since Nov do we make appt or schedule for B-12 shot?   Pt call back [phone]

## 2020-05-15 NOTE — Telephone Encounter (Signed)
Pt has not had B-12 since Nov and wants to schedule

## 2020-05-15 NOTE — Telephone Encounter (Signed)
Last time b12 checked on labs was 2019

## 2020-05-18 NOTE — Telephone Encounter (Signed)
Lab orders placed and mailed to patient. Pt husband wanted to know if he should by OTC B12 until appt; informed pt that we could set her up with nurse visit to have B12 injection. Pt husband was having a hard time hearing nurse. Placed message on lab orders that if pt would like injection that she could call and set up nurse visit.

## 2020-05-26 ENCOUNTER — Other Ambulatory Visit: Payer: Self-pay | Admitting: Family Medicine

## 2020-06-17 ENCOUNTER — Ambulatory Visit (INDEPENDENT_AMBULATORY_CARE_PROVIDER_SITE_OTHER): Payer: Medicare Other | Admitting: Ophthalmology

## 2020-06-17 ENCOUNTER — Encounter (INDEPENDENT_AMBULATORY_CARE_PROVIDER_SITE_OTHER): Payer: Self-pay | Admitting: Ophthalmology

## 2020-06-17 ENCOUNTER — Other Ambulatory Visit: Payer: Self-pay

## 2020-06-17 DIAGNOSIS — H353122 Nonexudative age-related macular degeneration, left eye, intermediate dry stage: Secondary | ICD-10-CM | POA: Diagnosis not present

## 2020-06-17 DIAGNOSIS — H353211 Exudative age-related macular degeneration, right eye, with active choroidal neovascularization: Secondary | ICD-10-CM

## 2020-06-17 MED ORDER — BEVACIZUMAB 2.5 MG/0.1ML IZ SOSY
2.5000 mg | PREFILLED_SYRINGE | INTRAVITREAL | Status: AC | PRN
  Administered 2020-06-17: 2.5 mg via INTRAVITREAL

## 2020-06-17 NOTE — Assessment & Plan Note (Signed)
Chronic white subfoveal disciform scar yet with preserved good visual acuity and overlying intraretinal fluid.  Needs stabilization and preservation of acuity using intravitreal Avastin currently at 8-week follow-up.  We will continue with the valuation 8 weeks

## 2020-06-17 NOTE — Assessment & Plan Note (Signed)
No signs of CNVM by OCT evaluation and follow-up today

## 2020-06-17 NOTE — Progress Notes (Signed)
06/17/2020     CHIEF COMPLAINT Patient presents for Retina Follow Up (8 Week F/U OD, poss Avastin OD//Pt denies noticeable changes to [place] OU since last visit. Pt denies ocular pain, flashes of light, or floaters OU. //)   HISTORY OF PRESENT ILLNESS: Susan Mcneil is a 75 y.o. female who presents to the clinic today for:   HPI    Retina Follow Up    Patient presents with  Wet AMD.  In right eye.  This started 8 weeks ago.  Severity is mild.  Duration of 8 weeks.  Since onset it is stable. Additional comments: 8 Week F/U OD, poss Avastin OD  Pt denies noticeable changes to [place] OU since last visit. Pt denies ocular pain, flashes of light, or floaters OU.          Last edited by Rockie Neighbours, [place] on 06/17/2020  8:59 AM. (History)      Referring physician: Kathyrn Drown, MD [address] [place],  [place] [address]  HISTORICAL INFORMATION:   Selected notes from the [place]: No current outpatient medications on file. (Ophthalmic Drugs)   No current facility-administered medications for this visit. (Ophthalmic Drugs)   Current Outpatient Medications (Other)  Medication Sig  . donepezil (ARICEPT) 5 MG tablet TAKE 1 TABLET BY MOUTH DAILY  . levothyroxine (SYNTHROID) 88 MCG tablet Take 1 tablet (88 mcg total) by mouth daily.  . memantine (NAMENDA) 10 MG tablet Take 1 tablet (10 mg total) by mouth 2 (two) times daily.  . rosuvastatin (CRESTOR) 10 MG tablet TAKE 1 TABLET(10 MG) BY MOUTH AT BEDTIME   No current facility-administered medications for this visit. (Other)      REVIEW OF SYSTEMS:    ALLERGIES No Known Allergies  PAST MEDICAL HISTORY Past Medical History:  Diagnosis Date  . Alzheimer's dementia ([place]) 11/21/2019  . 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  Fatty liver   . HTN (hypertension)    cholesterol & thyroid  . Hypothyroidism   . Kidney stone   . Macular degeneration   . Macular degeneration, bilateral   . Memory disturbance   . 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 Osteopenia   . Osteoporosis   . Reflux gastritis   . Thyroid disorder    Past Surgical History:  Procedure Laterality Date  . ABDOMINAL HYSTERECTOMY   patial, pre-uterine cancer, 1974  . BIOPSY  10/27/2016   Procedure: BIOPSY;  Surgeon: Rogene Houston, MD;  [facility];  Service: Endoscopy;;  esophagus  . BIOPSY  01/17/2018   Procedure: BIOPSY;  Surgeon: Rogene Houston, MD;  [facility];  Service: Endoscopy;;  colon  . COLONOSCOPY N/A 04/01/2015   Procedure: COLONOSCOPY;  Surgeon: Rogene Houston, MD;  [facility];  Service: Endoscopy;  Laterality: N/A;  1030  . COLONOSCOPY N/A 01/17/2018   Procedure: COLONOSCOPY;  Surgeon: Rogene Houston, MD;  [facility];  Service: Endoscopy;  Laterality: N/A;  9:30  . DIAGNOSTIC LAPAROSCOPY    . ESOPHAGOGASTRODUODENOSCOPY N/A 10/27/2016   Procedure: ESOPHAGOGASTRODUODENOSCOPY (EGD);  Surgeon: Rogene Houston, MD;  [facility];  Service: Endoscopy;  Laterality: N/A;  100  . TOTAL ABDOMINAL HYSTERECTOMY W/ BILATERAL SALPINGOOPHORECTOMY  FAMILY HISTORY Family History  Problem Relation Age of Onset  . Cancer Mother   . Stroke Father     SOCIAL HISTORY Social History   Tobacco Use  . Smoking status: Former Smoker    Quit date: 12/07/2000    Years since quitting: 19.5  . Smokeless tobacco: Never Used  Substance Use Topics  . Alcohol use: No  . Drug use: No         OPHTHALMIC EXAM:  Base Eye Exam    Visual Acuity (ETDRS)      Right Left   Dist [place] 20/30 -2 20/20   Dist ph [place] NI        Tonometry (Tonopen, 9:01 AM)      Right Left   Pressure 12 12       Pupils      Pupils Dark Light Shape React APD   Right PERRL 2 2  Round Minimal None   Left PERRL 2 2 Round Minimal None       Visual Fields (Counting fingers)      Left Right    Full Full       Extraocular Movement      Right Left    Full Full       Neuro/Psych    Mood/Affect: Disoriented  Pt seems disoriented today       Dilation    Right eye: 1.0% Mydriacyl, 2.5% Phenylephrine @ 9:04 AM        Slit Lamp and Fundus Exam    External Exam      Right Left   External Normal Normal       Slit Lamp Exam      Right Left   Lids/Lashes Normal Normal   Conjunctiva/Sclera White and quiet White and quiet   Cornea Clear Clear   Anterior Chamber Deep and quiet Deep and quiet   Iris Round and reactive Round and reactive   Lens Posterior chamber intraocular lens Posterior chamber intraocular lens   Anterior Vitreous Normal Normal       Fundus Exam      Right Left   Posterior Vitreous Posterior vitreous detachment    Disc Normal    C/D Ratio 0.7    Macula Atrophy, Geographic atrophy, Disciform scar with dark red large neovascular trunk which is mature new blood supply to the fovea,, Cystoid macular edema, Macular thickening, no exudates, no hemorrhage    Vessels Normal    Periphery Normal           IMAGING AND PROCEDURES  Imaging and Procedures for 06/17/20  OCT, Retina - OU - Both Eyes       Right Eye Scan locations included subfoveal. Central Foveal Thickness: 368. Progression has improved. Findings include cystoid macular edema, pigment epithelial detachment, disciform scar.   Left Eye Quality was good. Scan locations included subfoveal. Central Foveal Thickness: 282. Progression has been stable. Findings include pigment epithelial detachment.   Notes OD with chronic cystoid intraretinal fluid change overlying a vascularized pigment epithelial detachment. Currently stable 8-week interval of examination post Avastin will repeat injection today  OS no signs of active CNVM, incidental posterior vitreous detachment is  visualized       Intravitreal Injection, Pharmacologic Agent - OD - Right Eye       Time Out 06/17/2020. 10:29 AM. Confirmed correct patient, procedure, site, and patient consented.   Anesthesia Topical anesthesia was used. Anesthetic medications included Akten 3.5%.   Procedure Preparation included Tobramycin 0.3%, 10%  betadine to eyelids, 5% betadine to ocular surface. A 30 gauge needle was used.   Injection:  2.5 mg Bevacizumab (AVASTIN) 2.5mg /0.54mL SOSY   NDC: 53614-431-54, Lot: [id number]   Route: Intravitreal, Site: Right Eye  Post-op Post injection exam found visual acuity of at least counting fingers. The patient tolerated the procedure well. There were no complications. The patient received written and verbal post procedure care education. Post injection medications were not given.                 ASSESSMENT/PLAN:  Intermediate stage nonexudative age-related macular degeneration of left eye No signs of CNVM by OCT evaluation and follow-up today  Exudative age-related macular degeneration of right eye with active choroidal neovascularization (HCC) Chronic white subfoveal disciform scar yet with preserved good visual acuity and overlying intraretinal fluid.  Needs stabilization and preservation of acuity using intravitreal Avastin currently at 8-week follow-up.  We will continue with the valuation 8 weeks      ICD-10-CM   1. Exudative age-related macular degeneration of right eye with active choroidal neovascularization (HCC)  H35.3211 OCT, Retina - OU - Both Eyes    Intravitreal Injection, Pharmacologic Agent - OD - Right Eye    bevacizumab (AVASTIN) SOSY 2.5 mg  2. Intermediate stage nonexudative age-related macular degeneration of left eye  H35.3122     1.  Excellent preservation of vision right eye with subfoveal large white fibrotic disciform scar with chronic activity overlying juxta foveal with intraretinal fluid, and CME OD, will continue to treat with  intravitreal Avastin today and in order to maintain this dynamic yet stable state of vision.  2.  No signs of CNVM OS on OCT  3.  Dilate OU next  Ophthalmic Meds Ordered this visit:  Meds ordered this encounter  Medications  . bevacizumab (AVASTIN) SOSY 2.5 mg       Return in about 8 weeks (around 08/12/2020) for DILATE OU, AVASTIN OCT, OD.  There are no Patient Instructions on file for this visit.   Explained the diagnoses, plan, and follow up with the patient and they expressed understanding.  Patient expressed understanding of the importance of proper follow up care.   Clent Demark Priscila Bean M.D. Diseases & Surgery of the Retina and Vitreous Retina & Diabetic [place] 06/17/20     Abbreviations: M myopia (nearsighted); A astigmatism; H hyperopia (farsighted); P presbyopia; Mrx spectacle prescription;  CTL contact lenses; OD right eye; OS left eye; OU both eyes  XT exotropia; ET esotropia; PEK punctate epithelial keratitis; PEE punctate epithelial erosions; DES dry eye syndrome; MGD meibomian gland dysfunction; ATs artificial tears; PFAT's preservative free artificial tears; [place] nuclear sclerotic cataract; PSC posterior subcapsular cataract; ERM epi-retinal membrane; PVD posterior vitreous detachment; RD retinal detachment; DM diabetes mellitus; DR diabetic retinopathy; NPDR non-proliferative diabetic retinopathy; PDR proliferative diabetic retinopathy; CSME clinically significant macular edema; DME diabetic macular edema; dbh dot blot hemorrhages; CWS cotton wool spot; POAG primary open angle glaucoma; C/D cup-to-disc ratio; HVF humphrey visual field; GVF goldmann visual field; OCT optical coherence tomography; IOP intraocular pressure; BRVO Branch retinal vein occlusion; CRVO central retinal vein occlusion; CRAO central retinal artery occlusion; BRAO branch retinal artery occlusion; RT retinal tear; SB scleral buckle; PPV pars plana vitrectomy; VH Vitreous hemorrhage; PRP panretinal laser  photocoagulation; IVK intravitreal kenalog; VMT vitreomacular traction; MH Macular hole;  NVD neovascularization of the disc; NVE neovascularization elsewhere; AREDS age related eye disease study; ARMD age related macular degeneration; POAG primary open angle glaucoma; EBMD epithelial/anterior basement  membrane dystrophy; ACIOL anterior chamber intraocular lens; IOL intraocular lens; PCIOL posterior chamber intraocular lens; Phaco/IOL phacoemulsification with intraocular lens placement; [place] photorefractive keratectomy; LASIK laser assisted in situ keratomileusis; HTN hypertension; DM diabetes mellitus; COPD chronic obstructive pulmonary disease

## 2020-07-13 DIAGNOSIS — E782 Mixed hyperlipidemia: Secondary | ICD-10-CM | POA: Diagnosis not present

## 2020-07-13 DIAGNOSIS — I10 Essential (primary) hypertension: Secondary | ICD-10-CM | POA: Diagnosis not present

## 2020-07-13 DIAGNOSIS — E538 Deficiency of other specified B group vitamins: Secondary | ICD-10-CM | POA: Diagnosis not present

## 2020-07-13 DIAGNOSIS — E039 Hypothyroidism, unspecified: Secondary | ICD-10-CM | POA: Diagnosis not present

## 2020-07-14 LAB — VITAMIN B12: Vitamin B-12: 704 pg/mL (ref 232–1245)

## 2020-07-14 LAB — BASIC METABOLIC PANEL
BUN/Creatinine Ratio: 19 (ref 12–28)
BUN: 12 mg/dL (ref 8–27)
CO2: 25 mmol/L (ref 20–29)
Calcium: 9.3 mg/dL (ref 8.7–10.3)
Chloride: 104 mmol/L (ref 96–106)
Creatinine, Ser: 0.64 mg/dL (ref 0.57–1.00)
Glucose: 93 mg/dL (ref 65–99)
Potassium: 3.4 mmol/L — ABNORMAL LOW (ref 3.5–5.2)
Sodium: 146 mmol/L — ABNORMAL HIGH (ref 134–144)
eGFR: 92 mL/min/{1.73_m2} (ref >59)

## 2020-07-14 LAB — LIPID PANEL
Chol/HDL Ratio: 2.7 ratio (ref 0.0–4.4)
Cholesterol, Total: 160 mg/dL (ref 100–199)
HDL: 60 mg/dL (ref >39)
LDL Chol Calc (NIH): 80 mg/dL (ref 0–99)
Triglycerides: 113 mg/dL (ref 0–149)
VLDL Cholesterol Cal: 20 mg/dL (ref 5–40)

## 2020-07-14 LAB — T4, FREE: Free T4: 1.66 ng/dL (ref 0.82–1.77)

## 2020-07-14 LAB — TSH: TSH: 0.396 u[IU]/mL — ABNORMAL LOW (ref 0.450–4.500)

## 2020-07-27 ENCOUNTER — Encounter: Payer: Self-pay | Admitting: Family Medicine

## 2020-07-27 ENCOUNTER — Ambulatory Visit: Payer: Medicare Other | Admitting: Family Medicine

## 2020-08-04 ENCOUNTER — Other Ambulatory Visit: Payer: Self-pay

## 2020-08-04 ENCOUNTER — Other Ambulatory Visit: Payer: Self-pay | Admitting: *Deleted

## 2020-08-04 ENCOUNTER — Ambulatory Visit (INDEPENDENT_AMBULATORY_CARE_PROVIDER_SITE_OTHER): Payer: Medicare Other | Admitting: Family Medicine

## 2020-08-04 ENCOUNTER — Encounter: Payer: Self-pay | Admitting: Family Medicine

## 2020-08-04 VITALS — BP 138/86 | HR 68 | Temp 97.1°F | Ht 64.0 in | Wt 172.0 lb

## 2020-08-04 DIAGNOSIS — E782 Mixed hyperlipidemia: Secondary | ICD-10-CM

## 2020-08-04 DIAGNOSIS — G309 Alzheimer's disease, unspecified: Secondary | ICD-10-CM

## 2020-08-04 DIAGNOSIS — E038 Other specified hypothyroidism: Secondary | ICD-10-CM

## 2020-08-04 DIAGNOSIS — I10 Essential (primary) hypertension: Secondary | ICD-10-CM | POA: Diagnosis not present

## 2020-08-04 DIAGNOSIS — G308 Other Alzheimer's disease: Secondary | ICD-10-CM | POA: Diagnosis not present

## 2020-08-04 DIAGNOSIS — F0280 Dementia in other diseases classified elsewhere without behavioral disturbance: Secondary | ICD-10-CM

## 2020-08-04 MED ORDER — DONEPEZIL HCL 5 MG PO TABS: ORAL_TABLET | ORAL | 5 refills | Status: DC

## 2020-08-04 MED ORDER — LEVOTHYROXINE SODIUM 75 MCG PO TABS: 75.0000 ug | ORAL_TABLET | Freq: Every day | ORAL | 5 refills | Status: DC

## 2020-08-04 MED ORDER — ROSUVASTATIN CALCIUM 10 MG PO TABS: ORAL_TABLET | ORAL | 1 refills | Status: DC

## 2020-08-04 MED ORDER — MEMANTINE HCL 10 MG PO TABS: 10.0000 mg | ORAL_TABLET | Freq: Two times a day (BID) | ORAL | 5 refills | Status: DC

## 2020-08-04 NOTE — Progress Notes (Signed)
   Subjective:    Patient ID: Susan Mcneil, female    DOB: 1946-02-15, 75 y.o.   MRN: [id number]  HPI Patient is here to follow up on lab results for HTN, hypothyroidism, hyperlipidemia Primary hypertension  Other specified hypothyroidism  Mixed hyperlipidemia  Alzheimer's dementia of other onset without behavioral disturbance ([place])  Significant thyroid issues takes her medication regular basis.  Tries to stay consistent husband helps out Alzheimer's getting worse worsening memory function forgetting things frequently still able to get dressed still able to utilize the bathroom but does have some accidents no stooling issues.  Moods overall are doing okay but she gets upset when people talk about her condition  Review of Systems     Objective:   Physical Exam  Lungs are clear heart regular pulse normal BP fair      Assessment & Plan:  1. Primary hypertension Blood pressure decent control continue current measures watch diet get more vegetables and  2. Other specified hypothyroidism Reduce the thyroid medicine.  Recheck labs again in several months  3. Mixed hyperlipidemia Continue medication recent labs good  4. Alzheimer's dementia of other onset without behavioral disturbance ([place]) Worsening Alzheimer's continue both medicines long-term prognosis guarded information regarding caregiving as well as Alzheimer's was printed and given.  Husband very frustrated having to do all of the caregiving family has pulled away from them  Would benefit from a [place] sitter a few times a week we will try to get information together for the husband to help him  He is hopeful that she will qualify for home health through her insurance I am not sure that that will occur because of home health stipulations but we will try to make the referral

## 2020-08-12 ENCOUNTER — Ambulatory Visit (INDEPENDENT_AMBULATORY_CARE_PROVIDER_SITE_OTHER): Payer: Medicare Other | Admitting: Ophthalmology

## 2020-08-12 ENCOUNTER — Encounter (INDEPENDENT_AMBULATORY_CARE_PROVIDER_SITE_OTHER): Payer: Self-pay | Admitting: Ophthalmology

## 2020-08-12 ENCOUNTER — Other Ambulatory Visit: Payer: Self-pay

## 2020-08-12 DIAGNOSIS — H353211 Exudative age-related macular degeneration, right eye, with active choroidal neovascularization: Secondary | ICD-10-CM | POA: Diagnosis not present

## 2020-08-12 DIAGNOSIS — H353122 Nonexudative age-related macular degeneration, left eye, intermediate dry stage: Secondary | ICD-10-CM | POA: Diagnosis not present

## 2020-08-12 MED ORDER — BEVACIZUMAB 2.5 MG/0.1ML IZ SOSY
2.5000 mg | PREFILLED_SYRINGE | INTRAVITREAL | Status: AC | PRN
  Administered 2020-08-12: 2.5 mg via INTRAVITREAL

## 2020-08-12 NOTE — Assessment & Plan Note (Signed)
The nature of wet macular degeneration was discussed with the patient.  Forms of therapy reviewed include the use of Anti-VEGF medications injected painlessly into the eye, as well as other possible treatment modalities, including thermal laser therapy. Fellow eye involvement and risks were discussed with the patient. Upon the finding of wet age related macular degeneration, treatment will be offered. The treatment regimen is on a treat as needed basis with the intent to treat if necessary and extend interval of exams when possible. On average 1 out of 6 patients do not need lifetime therapy. However, the risk of recurrent disease is high for a lifetime.  Initially monthly, then periodic, examinations and evaluations will determine whether the next treatment is required on the day of the examination.  Chronic active, CNVM with SR fibrosis and treatment in order to prevent migration into the

## 2020-08-12 NOTE — Progress Notes (Signed)
08/12/2020     CHIEF COMPLAINT Patient presents for Retina Follow Up (8 Wk F/U OU, poss Avastin OD//Pt denies noticeable changes to [place] OU since last visit. Pt denies ocular pain, flashes of light, or floaters OU. //)   HISTORY OF PRESENT ILLNESS: Susan Mcneil is a 75 y.o. female who presents to the clinic today for:   HPI    Retina Follow Up    Patient presents with  Wet AMD.  In right eye.  This started 8 weeks ago.  Severity is mild.  Duration of 8 weeks.  Since onset it is stable. Additional comments: 8 Wk F/U OU, poss Avastin OD  Pt denies noticeable changes to [place] OU since last visit. Pt denies ocular pain, flashes of light, or floaters OU.          Last edited by Rockie Neighbours, [place] on 08/12/2020 10:18 AM. (History)      Referring physician: Kathyrn Drown, MD [address] [place],  [place] [address]  HISTORICAL INFORMATION:   Selected notes from the [facility]: No current outpatient medications on file. (Ophthalmic Drugs)   No current facility-administered medications for this visit. (Ophthalmic Drugs)   Current Outpatient Medications (Other)  Medication Sig  . donepezil (ARICEPT) 5 MG tablet TAKE 1 TABLET BY MOUTH DAILY  . levothyroxine (SYNTHROID) 75 MCG tablet Take 1 tablet (75 mcg total) by mouth daily.  . memantine (NAMENDA) 10 MG tablet Take 1 tablet (10 mg total) by mouth 2 (two) times daily.  . rosuvastatin (CRESTOR) 10 MG tablet 1 qd   No current facility-administered medications for this visit. (Other)      REVIEW OF SYSTEMS:    ALLERGIES No Known Allergies  PAST MEDICAL HISTORY Past Medical History:  Diagnosis Date  . Alzheimer's dementia ([place]) 11/21/2019  . 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 Fatty liver   . HTN (hypertension)     cholesterol & thyroid  . Hypothyroidism   . Kidney stone   . Macular degeneration   . Macular degeneration, bilateral   . Memory disturbance   . 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 Osteopenia   . Osteoporosis   . Reflux gastritis   . Thyroid disorder    Past Surgical History:  Procedure Laterality Date  . ABDOMINAL HYSTERECTOMY   patial, pre-uterine cancer, 1974  . BIOPSY  10/27/2016   Procedure: BIOPSY;  Surgeon: Rogene Houston, MD;  [facility];  Service: Endoscopy;;  esophagus  . BIOPSY  01/17/2018   Procedure: BIOPSY;  Surgeon: Rogene Houston, MD;  [facility];  Service: Endoscopy;;  colon  . COLONOSCOPY N/A 04/01/2015   Procedure: COLONOSCOPY;  Surgeon: Rogene Houston, MD;  [facility];  Service: Endoscopy;  Laterality: N/A;  1030  . COLONOSCOPY N/A 01/17/2018   Procedure: COLONOSCOPY;  Surgeon: Rogene Houston, MD;  [facility];  Service: Endoscopy;  Laterality: N/A;  9:30  . DIAGNOSTIC LAPAROSCOPY    . ESOPHAGOGASTRODUODENOSCOPY N/A 10/27/2016   Procedure: ESOPHAGOGASTRODUODENOSCOPY (EGD);  Surgeon: Rogene Houston, MD;  [facility];  Service: Endoscopy;  Laterality: N/A;  100  . TOTAL ABDOMINAL HYSTERECTOMY W/ BILATERAL SALPINGOOPHORECTOMY      FAMILY HISTORY Family History  Problem Relation Age of Onset  . Cancer Mother   . Stroke Father     SOCIAL HISTORY Social History   Tobacco Use  . Smoking status: Former Smoker    Quit date: 12/07/2000    Years since quitting: 19.6  . Smokeless tobacco: Never Used  Substance Use Topics  . Alcohol use: No  . Drug use: No         OPHTHALMIC EXAM: Base Eye Exam    Visual Acuity (ETDRS)      Right Left   Dist [place] 20/40 -2 20/20   Dist ph [place] NI        Tonometry (Tonopen, 10:18 AM)      Right Left   Pressure 15 15       Pupils      Pupils Dark Light Shape React APD   Right PERRL 3 3 Round Minimal None   Left PERRL 3 3 Round  Minimal None       Visual Fields (Counting fingers)      Left Right    Full Full       Extraocular Movement      Right Left    Full Full       Neuro/Psych    Oriented x3: Yes   Mood/Affect: Normal       Dilation    Both eyes: 1.0% Mydriacyl, 2.5% Phenylephrine @ 10:22 AM        Slit Lamp and Fundus Exam    External Exam      Right Left   External Normal Normal       Slit Lamp Exam      Right Left   Lids/Lashes Normal Normal   Conjunctiva/Sclera White and quiet White and quiet   Cornea Clear Clear   Anterior Chamber Deep and quiet Deep and quiet   Iris Round and reactive Round and reactive   Lens Posterior chamber intraocular lens Posterior chamber intraocular lens   Anterior Vitreous Normal Normal       Fundus Exam      Right Left   Posterior Vitreous Posterior vitreous detachment Posterior vitreous detachment   Disc Normal Normal   C/D Ratio 0.7 0.65   Macula Atrophy, Geographic atrophy, Disciform scar with dark red large neovascular trunk which is mature new blood supply to the fovea,, Cystoid macular edema, Macular thickening, no exudates, no hemorrhage Intermediate age related macular degeneration, no exudates, no hemorrhage, no macular thickening   Vessels Normal Normal   Periphery Normal Normal          IMAGING AND PROCEDURES  Imaging and Procedures for 08/12/20  OCT, Retina - OU - Both Eyes       Right Eye Quality was good. Scan locations included subfoveal. Central Foveal Thickness: 324. Progression has improved. Findings include cystoid macular edema, pigment epithelial detachment, disciform scar, abnormal foveal contour.   Left Eye Quality was good. Scan locations included subfoveal. Central Foveal Thickness: 232. Progression has been stable. Findings include pigment epithelial detachment, abnormal foveal contour, retinal drusen .   Notes OD with chronic cystoid intraretinal fluid change overlying a vascularized pigment epithelial  detachment. Currently stable 8-week interval of examination post Avastin will repeat injection today  OS no signs of active CNVM, incidental posterior vitreous detachment is visualized       Intravitreal Injection, Pharmacologic Agent - OD - Right Eye       Time Out 08/12/2020. 11:27 AM. Confirmed correct patient, procedure, site, and patient consented.  Anesthesia Topical anesthesia was used. Anesthetic medications included Akten 3.5%.   Procedure Preparation included Tobramycin 0.3%, 10% betadine to eyelids, 5% betadine to ocular surface. A 30 gauge needle was used.   Injection:  2.5 mg Bevacizumab (AVASTIN) 2.5mg /0.71mL SOSY   NDC: 54562-563-89, Lot: [id number]   Route: Intravitreal, Site: Right Eye  Post-op Post injection exam found visual acuity of at least counting fingers. The patient tolerated the procedure well. There were no complications. The patient received written and verbal post procedure care education. Post injection medications were not given.                 ASSESSMENT/PLAN:  Exudative age-related macular degeneration of right eye with active choroidal neovascularization (HCC) The nature of wet macular degeneration was discussed with the patient.  Forms of therapy reviewed include the use of Anti-VEGF medications injected painlessly into the eye, as well as other possible treatment modalities, including thermal laser therapy. Fellow eye involvement and risks were discussed with the patient. Upon the finding of wet age related macular degeneration, treatment will be offered. The treatment regimen is on a treat as needed basis with the intent to treat if necessary and extend interval of exams when possible. On average 1 out of 6 patients do not need lifetime therapy. However, the risk of recurrent disease is high for a lifetime.  Initially monthly, then periodic, examinations and evaluations will determine whether the next treatment is required on the day of the  examination.  Chronic active, CNVM with SR fibrosis and treatment in order to prevent migration into the  Intermediate stage nonexudative age-related macular degeneration of left eye Stable left eye no active disease      ICD-10-CM   1. Exudative age-related macular degeneration of right eye with active choroidal neovascularization (HCC)  H35.3211 OCT, Retina - OU - Both Eyes    Intravitreal Injection, Pharmacologic Agent - OD - Right Eye    bevacizumab (AVASTIN) SOSY 2.5 mg  2. Intermediate stage nonexudative age-related macular degeneration of left eye  H35.3122     1.  Chronic active subfoveal disciform scar yet with preservation of good acuity as this white disciform scar is the new blood supply to the outer retina with overlying cystoid change in the macula.  Repeat injection today at 8 weeks and follow-up again in 10 weeks monitor for response  2.  No signs of active maculopathy OS  3.  Ophthalmic Meds Ordered this visit:  Meds ordered this encounter  Medications  . bevacizumab (AVASTIN) SOSY 2.5 mg       Return in about 10 weeks (around 10/21/2020) for dilate, OD, AVASTIN OCT.  There are no Patient Instructions on file for this visit.   Explained the diagnoses, plan, and follow up with the patient and they expressed understanding.  Patient expressed understanding of the importance of proper follow up care.   Clent Demark Avon Mergenthaler M.D. Diseases & Surgery of the Retina and Vitreous Retina & Diabetic [place] 08/12/20     Abbreviations: M myopia (nearsighted); A astigmatism; H hyperopia (farsighted); P presbyopia; Mrx spectacle prescription;  CTL contact lenses; OD right eye; OS left eye; OU both eyes  XT exotropia; ET esotropia; PEK punctate epithelial keratitis; PEE punctate epithelial erosions; DES dry eye syndrome; MGD meibomian gland dysfunction; ATs artificial tears; PFAT's preservative free artificial tears; [place] nuclear sclerotic cataract; PSC posterior subcapsular  cataract; ERM epi-retinal membrane; PVD posterior vitreous detachment; RD retinal detachment; DM diabetes mellitus; DR diabetic retinopathy; NPDR non-proliferative diabetic  retinopathy; PDR proliferative diabetic retinopathy; CSME clinically significant macular edema; DME diabetic macular edema; dbh dot blot hemorrhages; CWS cotton wool spot; POAG primary open angle glaucoma; C/D cup-to-disc ratio; HVF humphrey visual field; GVF goldmann visual field; OCT optical coherence tomography; IOP intraocular pressure; BRVO Branch retinal vein occlusion; CRVO central retinal vein occlusion; CRAO central retinal artery occlusion; BRAO branch retinal artery occlusion; RT retinal tear; SB scleral buckle; PPV pars plana vitrectomy; VH Vitreous hemorrhage; PRP panretinal laser photocoagulation; IVK intravitreal kenalog; VMT vitreomacular traction; MH Macular hole;  NVD neovascularization of the disc; NVE neovascularization elsewhere; AREDS age related eye disease study; ARMD age related macular degeneration; POAG primary open angle glaucoma; EBMD epithelial/anterior basement membrane dystrophy; ACIOL anterior chamber intraocular lens; IOL intraocular lens; PCIOL posterior chamber intraocular lens; Phaco/IOL phacoemulsification with intraocular lens placement; [place] photorefractive keratectomy; LASIK laser assisted in situ keratomileusis; HTN hypertension; DM diabetes mellitus; COPD chronic obstructive pulmonary disease

## 2020-08-12 NOTE — Assessment & Plan Note (Signed)
Stable left eye no active disease

## 2020-08-14 ENCOUNTER — Other Ambulatory Visit: Payer: Self-pay | Admitting: *Deleted

## 2020-08-14 MED ORDER — DONEPEZIL HCL 5 MG PO TABS: ORAL_TABLET | ORAL | 1 refills | Status: DC

## 2020-08-14 MED ORDER — MEMANTINE HCL 10 MG PO TABS: 10.0000 mg | ORAL_TABLET | Freq: Two times a day (BID) | ORAL | 1 refills | Status: DC

## 2020-08-14 MED ORDER — ROSUVASTATIN CALCIUM 10 MG PO TABS: ORAL_TABLET | ORAL | 1 refills | Status: DC

## 2020-08-14 MED ORDER — LEVOTHYROXINE SODIUM 75 MCG PO TABS: 75.0000 ug | ORAL_TABLET | Freq: Every day | ORAL | 1 refills | Status: DC

## 2020-10-21 ENCOUNTER — Encounter (INDEPENDENT_AMBULATORY_CARE_PROVIDER_SITE_OTHER): Payer: Self-pay | Admitting: Ophthalmology

## 2020-10-21 ENCOUNTER — Ambulatory Visit (INDEPENDENT_AMBULATORY_CARE_PROVIDER_SITE_OTHER): Payer: Medicare Other | Admitting: Ophthalmology

## 2020-10-21 ENCOUNTER — Other Ambulatory Visit: Payer: Self-pay

## 2020-10-21 DIAGNOSIS — H353211 Exudative age-related macular degeneration, right eye, with active choroidal neovascularization: Secondary | ICD-10-CM | POA: Diagnosis not present

## 2020-10-21 DIAGNOSIS — H35351 Cystoid macular degeneration, right eye: Secondary | ICD-10-CM

## 2020-10-21 DIAGNOSIS — H353122 Nonexudative age-related macular degeneration, left eye, intermediate dry stage: Secondary | ICD-10-CM

## 2020-10-21 MED ORDER — BEVACIZUMAB 2.5 MG/0.1ML IZ SOSY
2.5000 mg | PREFILLED_SYRINGE | INTRAVITREAL | Status: AC | PRN
  Administered 2020-10-21: 2.5 mg via INTRAVITREAL

## 2020-10-21 NOTE — Assessment & Plan Note (Signed)
Chronic active subfoveal disciform with intrusion into the retina with intraretinal fluid although overall stabilized now for many years and now after slowly increasing extension of interval examination now 10 weeks, stabilized again.  We will repeat injection today and examination next right eye well weeks as we dilate OU

## 2020-10-21 NOTE — Progress Notes (Signed)
10/21/2020     CHIEF COMPLAINT Patient presents for Retina Follow Up (10 week fu OD and Avastin OD/Pt states VA OU stable since last visit. Pt denies FOL, floaters, or ocular pain OU. /)   HISTORY OF PRESENT ILLNESS: Susan Mcneil is a 75 y.o. female who presents to the clinic today for:   HPI     Retina Follow Up           Diagnosis: Wet AMD   Laterality: right eye   Onset: 10 weeks ago   Severity: mild   Duration: 10 weeks   Course: stable   Comments: 10 week fu OD and Avastin OD Pt states VA OU stable since last visit. Pt denies FOL, floaters, or ocular pain OU.           Comments   No interval change in acuity      Last edited by Hurman Horn, MD on 10/21/2020 10:14 AM.      Referring physician: Kathyrn Drown, MD [address] [place],   [id number]  HISTORICAL INFORMATION:   Selected notes from the [facility]: No current outpatient medications on file. (Ophthalmic Drugs)   No current facility-administered medications for this visit. (Ophthalmic Drugs)   Current Outpatient Medications (Other)  Medication Sig   donepezil (ARICEPT) 5 MG tablet TAKE 1 TABLET BY MOUTH DAILY   levothyroxine (SYNTHROID) 75 MCG tablet Take 1 tablet (75 mcg total) by mouth daily.   memantine (NAMENDA) 10 MG tablet Take 1 tablet (10 mg total) by mouth 2 (two) times daily.   rosuvastatin (CRESTOR) 10 MG tablet 1 qd   No current facility-administered medications for this visit. 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   Followed by [facility], most recent testing shows it is stable, they recommending follow-up in 2 years, June 14, 2017-follow-up will be in 2021   Fatty liver    HTN (hypertension)    cholesterol & thyroid   Hypothyroidism     Kidney stone    Macular degeneration    Macular degeneration, bilateral    Memory disturbance    Neovascular glaucoma, right eye, severe stage 09/04/2019   Osteopenia    Osteoporosis    Reflux gastritis    Thyroid disorder    Past Surgical History:  Procedure Laterality Date   ABDOMINAL HYSTERECTOMY   patial, pre-uterine cancer, 1974   BIOPSY  10/27/2016   Procedure: BIOPSY;  Surgeon: Rogene Houston, MD;  [facility];  Service: Endoscopy;;  esophagus   BIOPSY  01/17/2018   Procedure: BIOPSY;  Surgeon: Rogene Houston, MD;  [facility];  Service: Endoscopy;;  colon   COLONOSCOPY N/A 04/01/2015   Procedure: COLONOSCOPY;  Surgeon: Rogene Houston, MD;  [facility];  Service: Endoscopy;  Laterality: N/A;  1030   COLONOSCOPY N/A 01/17/2018   Procedure: COLONOSCOPY;  Surgeon: Rogene Houston, MD;  [facility];  Service: Endoscopy;  Laterality: N/A;  9:30   DIAGNOSTIC LAPAROSCOPY     ESOPHAGOGASTRODUODENOSCOPY N/A 10/27/2016   Procedure: ESOPHAGOGASTRODUODENOSCOPY (EGD);  Surgeon: Rogene Houston, MD;  [facility];  Service: Endoscopy;  Laterality: N/A;  100   TOTAL ABDOMINAL HYSTERECTOMY W/ BILATERAL  SALPINGOOPHORECTOMY      FAMILY HISTORY Family History  Problem Relation Age of Onset   Cancer Mother    Stroke Father     SOCIAL HISTORY Social History   Tobacco Use   Smoking status: Former    Pack years: 0.00    Types: Cigarettes    Quit date: 12/07/2000    Years since quitting: 19.8   Smokeless tobacco: Never  Substance Use Topics   Alcohol use: No   Drug use: No         OPHTHALMIC EXAM:  Base Eye Exam     Visual Acuity (ETDRS)       Right Left   Dist [place] 20/50 -1 20/25 -2   Dist ph [place] NI          Tonometry (Tonopen, 9:39 AM)       Right Left   Pressure 18 16         Pupils       Pupils Dark Light Shape React APD   Right PERRL 3 3 Round Minimal None   Left PERRL 3 3 Round Minimal None          Visual Fields       Left Right    Full Full         Extraocular Movement       Right Left    Full Full         Neuro/Psych     Oriented x3: Yes   Mood/Affect: Disoriented         Dilation     Right eye: 1.0% Mydriacyl, 2.5% Phenylephrine @ 9:39 AM           Slit Lamp and Fundus Exam     External Exam       Right Left   External Normal Normal         Slit Lamp Exam       Right Left   Lids/Lashes Normal Normal   Conjunctiva/Sclera White and quiet White and quiet   Cornea Clear Clear   Anterior Chamber Deep and quiet Deep and quiet   Iris Round and reactive Round and reactive   Lens Posterior chamber intraocular lens Posterior chamber intraocular lens   Anterior Vitreous Normal Normal         Fundus Exam       Right Left   Posterior Vitreous Posterior vitreous detachment    Disc Normal    C/D Ratio 0.7    Macula Atrophy, Geographic atrophy, Disciform scar with dark red large neovascular trunk which is mature new blood supply to the fovea,, Cystoid macular edema, Macular thickening, no exudates, no hemorrhage    Vessels Normal    Periphery Normal             IMAGING AND PROCEDURES  Imaging and Procedures for 10/21/20  OCT, Retina - OU - Both Eyes       Right Eye Quality was good. Scan locations included subfoveal. Central Foveal Thickness: 324. Progression has improved. Findings include cystoid macular edema, pigment epithelial detachment, disciform scar, abnormal foveal contour.   Left Eye Quality was borderline. Scan locations included subfoveal. Central Foveal Thickness: 289. Progression has been stable. Findings include pigment epithelial detachment, abnormal foveal contour, retinal drusen .   Notes OD with chronic cystoid intraretinal fluid change overlying a vascularized pigment epithelial detachment. Currently stable 8-week interval of examination post Avastin will repeat injection today  OS no signs of active  CNVM, incidental  posterior vitreous detachment is visualized, baseline RPE to retinal evaluation slightly increased today yet not reliable as compared to baseline     Intravitreal Injection, Pharmacologic Agent - OD - Right Eye       Time Out 10/21/2020. 10:15 AM. Confirmed correct patient, procedure, site, and patient consented.   Anesthesia Topical anesthesia was used. Anesthetic medications included [place] 3.5%.   Procedure Preparation included Tobramycin 0.3%, 10% betadine to eyelids, 5% betadine to ocular surface. A 30 gauge needle was used.   Injection: 2.5 mg bevacizumab 2.5 MG/0.1ML   Route: Intravitreal, Site: Right Eye   NDC: [phone], Lot: [id number]   Post-op Post injection exam found visual acuity of at least counting fingers. The patient tolerated the procedure well. There were no complications. The patient received written and verbal post procedure care education. Post injection medications were not given.              ASSESSMENT/PLAN:  Exudative age-related macular degeneration of right eye with active choroidal neovascularization (HCC) Chronic active subfoveal disciform with intrusion into the retina with intraretinal fluid although overall stabilized now for many years and now after slowly increasing extension of interval examination now 10 weeks, stabilized again.  We will repeat injection today and examination next right eye well weeks as we dilate OU  Intermediate stage nonexudative age-related macular degeneration of left eye No signs of CNVM by OCT dilate OD  Cystoid macular edema of right eye Chronic and associated with CNVM, yet no change over time, 10 weeks post Avastin will observe     ICD-10-CM   1. Exudative age-related macular degeneration of right eye with active choroidal neovascularization (HCC)  H35.3211 OCT, Retina - OU - Both Eyes    Intravitreal Injection, Pharmacologic Agent - OD - Right Eye    bevacizumab (AVASTIN) SOSY 2.5 mg    2.  Intermediate stage nonexudative age-related macular degeneration of left eye  H35.3122     3. Cystoid macular edema of right eye  H35.351       1.  Repeat intravitreal Avastin OD today, at 10-week interval, to maintain stability of subfoveal white disciform scar seen clinically and prevention of extension and loss of vision  2.  Dilate OU next, in 12 weeks, likely injection Avastin OD to maintain  3.  Ophthalmic Meds Ordered this visit:  Meds ordered this encounter  Medications   bevacizumab (AVASTIN) SOSY 2.5 mg       Return in about 12 weeks (around 01/13/2021) for DILATE OU, AVASTIN OCT, OD.  There are no Patient Instructions on file for this visit.   Explained the diagnoses, plan, and follow up with the patient and they expressed understanding.  Patient expressed understanding of the importance of proper follow up care.   Clent Demark Kamill Fulbright M.D. Diseases & Surgery of the Retina and Vitreous Retina & Diabetic [place] 10/21/20     Abbreviations: M myopia (nearsighted); A astigmatism; H hyperopia (farsighted); P presbyopia; Mrx spectacle prescription;  CTL contact lenses; OD right eye; OS left eye; OU both eyes  XT exotropia; ET esotropia; PEK punctate epithelial keratitis; PEE punctate epithelial erosions; DES dry eye syndrome; MGD meibomian gland dysfunction; ATs artificial tears; PFAT's preservative free artificial tears; [place] nuclear sclerotic cataract; PSC posterior subcapsular cataract; ERM epi-retinal membrane; PVD posterior vitreous detachment; RD retinal detachment; DM diabetes mellitus; DR diabetic retinopathy; NPDR non-proliferative diabetic retinopathy; PDR proliferative diabetic retinopathy; CSME clinically significant macular edema; DME diabetic macular edema; dbh dot blot hemorrhages; CWS cotton wool  spot; POAG primary open angle glaucoma; C/D cup-to-disc ratio; HVF humphrey visual field; GVF goldmann visual field; OCT optical coherence tomography; IOP intraocular  pressure; BRVO Branch retinal vein occlusion; CRVO central retinal vein occlusion; CRAO central retinal artery occlusion; BRAO branch retinal artery occlusion; RT retinal tear; SB scleral buckle; PPV pars plana vitrectomy; VH Vitreous hemorrhage; PRP panretinal laser photocoagulation; IVK intravitreal kenalog; VMT vitreomacular traction; MH Macular hole;  NVD neovascularization of the disc; NVE neovascularization elsewhere; AREDS age related eye disease study; ARMD age related macular degeneration; POAG primary open angle glaucoma; EBMD epithelial/anterior basement membrane dystrophy; ACIOL anterior chamber intraocular lens; IOL intraocular lens; PCIOL posterior chamber intraocular lens; Phaco/IOL phacoemulsification with intraocular lens placement; [place] photorefractive keratectomy; LASIK laser assisted in situ keratomileusis; HTN hypertension; DM diabetes mellitus; COPD chronic obstructive pulmonary disease

## 2020-10-21 NOTE — Assessment & Plan Note (Signed)
Chronic and associated with CNVM, yet no change over time, 10 weeks post Avastin will observe

## 2020-10-21 NOTE — Assessment & Plan Note (Signed)
No signs of CNVM by OCT dilate OD

## 2020-11-11 ENCOUNTER — Ambulatory Visit: Payer: Medicare Other | Admitting: Family Medicine

## 2020-11-11 ENCOUNTER — Encounter: Payer: Self-pay | Admitting: Family Medicine

## 2020-11-30 DIAGNOSIS — E039 Hypothyroidism, unspecified: Secondary | ICD-10-CM | POA: Diagnosis not present

## 2020-12-01 LAB — TSH: TSH: 0.568 u[IU]/mL (ref 0.450–4.500)

## 2020-12-01 LAB — T4, FREE: Free T4: 1.73 ng/dL (ref 0.82–1.77)

## 2020-12-04 ENCOUNTER — Other Ambulatory Visit: Payer: Self-pay | Admitting: Family Medicine

## 2020-12-08 ENCOUNTER — Other Ambulatory Visit: Payer: Self-pay

## 2020-12-08 ENCOUNTER — Ambulatory Visit (INDEPENDENT_AMBULATORY_CARE_PROVIDER_SITE_OTHER): Payer: Medicare Other | Admitting: Family Medicine

## 2020-12-08 ENCOUNTER — Encounter: Payer: Self-pay | Admitting: Family Medicine

## 2020-12-08 VITALS — BP 117/80 | HR 74 | Temp 97.7°F | Ht 64.0 in | Wt 168.8 lb

## 2020-12-08 DIAGNOSIS — G308 Other Alzheimer's disease: Secondary | ICD-10-CM | POA: Diagnosis not present

## 2020-12-08 DIAGNOSIS — I700 Atherosclerosis of aorta: Secondary | ICD-10-CM | POA: Diagnosis not present

## 2020-12-08 DIAGNOSIS — E876 Hypokalemia: Secondary | ICD-10-CM | POA: Diagnosis not present

## 2020-12-08 DIAGNOSIS — E038 Other specified hypothyroidism: Secondary | ICD-10-CM | POA: Diagnosis not present

## 2020-12-08 DIAGNOSIS — I10 Essential (primary) hypertension: Secondary | ICD-10-CM | POA: Diagnosis not present

## 2020-12-08 DIAGNOSIS — E782 Mixed hyperlipidemia: Secondary | ICD-10-CM | POA: Diagnosis not present

## 2020-12-08 DIAGNOSIS — F0280 Dementia in other diseases classified elsewhere without behavioral disturbance: Secondary | ICD-10-CM

## 2020-12-08 NOTE — Progress Notes (Signed)
   Subjective:    Patient ID: Susan Mcneil, female    DOB: [birth year], 75 y.o.   MRN: [id number]  Hyperlipidemia  Hypothyroidism   Lab results Primary hypertension  Thoracic aorta atherosclerosis ([place]), Chronic  Alzheimer's dementia of other onset without behavioral disturbance ([place])  Mixed hyperlipidemia  Other specified hypothyroidism  Results for orders placed or performed in visit on 40/97/35  Basic Metabolic Panel (BMET)  Result Value Ref Range   Glucose 93 65 - 99 mg/dL   BUN 12 8 - 27 mg/dL   Creatinine, Ser 0.64 0.57 - 1.00 mg/dL   eGFR 92 >59 mL/min/1.73   BUN/Creatinine Ratio 19 12 - 28   Sodium 146 (H) 134 - 144 mmol/L   Potassium 3.4 (L) 3.5 - 5.2 mmol/L   Chloride 104 96 - 106 mmol/L   CO2 25 20 - 29 mmol/L   Calcium 9.3 8.7 - 10.3 mg/dL  TSH  Result Value Ref Range   TSH 0.396 (L) 0.450 - 4.500 uIU/mL  T4, free  Result Value Ref Range   Free T4 1.66 0.82 - 1.77 ng/dL  Lipid Profile  Result Value Ref Range   Cholesterol, Total 160 100 - 199 mg/dL   Triglycerides 113 0 - 149 mg/dL   HDL 60 >39 mg/dL   VLDL Cholesterol Cal 20 5 - 40 mg/dL   LDL Chol Calc (NIH) 80 0 - 99 mg/dL   Chol/HDL Ratio 2.7 0.0 - 4.4 ratio  B12  Result Value Ref Range   Vitamin B-12 704 232 - 1,245 pg/mL      Review of Systems     Objective:   Physical Exam  General-in no acute distress Eyes-no discharge Lungs-respiratory rate normal, CTA CV-no murmurs,RRR Extremities skin warm dry no edema Neuro grossly normal Behavior normal, alert       Assessment & Plan:  1. Primary hypertension HTN- patient seen for follow-up regarding HTN.  Diet, medication compliance, appropriate labs and refills were completed.  Importance of keeping blood pressure under good control to lessen the risk of complications discussed   2. Thoracic aorta atherosclerosis ([place]) Very important for patient to stay on cholesterol medicine.  Recent lab work looked relatively good  3.  Alzheimer's dementia of other onset without behavioral disturbance [facility]) Patient doing well on both medicines.  Continue current dosing.  There has been some decline in her function husband pretty much takes care of her in regards to food preparation getting her around currently right now not in need of additional support -Slow progress of dementia currently 4. Mixed hyperlipidemia Very important to keep cholesterol under control continue medication  thyroid important to continue the medication  Check met 7, TSH  25 minutes spent discussing Alzheimer's as well as cholesterol blood pressure thyroid to the family

## 2020-12-09 NOTE — Progress Notes (Signed)
12/09/20-lab orders placed and mailed to patient

## 2020-12-09 NOTE — Addendum Note (Signed)
Addended by: Vicente Males on: 12/09/2020 09:03 AM   Modules accepted: Orders

## 2020-12-09 NOTE — Progress Notes (Signed)
12/09/20-lab orders placed and mailed to patient as reminder

## 2020-12-23 ENCOUNTER — Telehealth: Payer: Self-pay | Admitting: Family Medicine

## 2020-12-23 MED ORDER — ROSUVASTATIN CALCIUM 10 MG PO TABS: ORAL_TABLET | ORAL | 1 refills | Status: DC

## 2020-12-23 NOTE — Telephone Encounter (Signed)
Refill sent to [facility] and pt is aware

## 2020-12-23 NOTE — Telephone Encounter (Signed)
Patient is requesting refill on rosuvastatin 10 mg to [facility] order.

## 2021-01-13 ENCOUNTER — Ambulatory Visit (INDEPENDENT_AMBULATORY_CARE_PROVIDER_SITE_OTHER): Payer: Medicare Other | Admitting: Ophthalmology

## 2021-01-13 ENCOUNTER — Encounter (INDEPENDENT_AMBULATORY_CARE_PROVIDER_SITE_OTHER): Payer: Self-pay | Admitting: Ophthalmology

## 2021-01-13 ENCOUNTER — Other Ambulatory Visit: Payer: Self-pay

## 2021-01-13 DIAGNOSIS — H353211 Exudative age-related macular degeneration, right eye, with active choroidal neovascularization: Secondary | ICD-10-CM

## 2021-01-13 DIAGNOSIS — H353122 Nonexudative age-related macular degeneration, left eye, intermediate dry stage: Secondary | ICD-10-CM | POA: Diagnosis not present

## 2021-01-13 MED ORDER — BEVACIZUMAB 2.5 MG/0.1ML IZ SOSY
2.5000 mg | PREFILLED_SYRINGE | INTRAVITREAL | Status: AC | PRN
  Administered 2021-01-13: 2.5 mg via INTRAVITREAL

## 2021-01-13 NOTE — Assessment & Plan Note (Signed)
OD, chronic subfoveal white disciform scar clinically, confirmed on OCT yet with intact visual elements and intraretinal fluid remaining stable at 12-week follow-up interval post Avastin.  Will repeat injection today and maintain 12-week maintenance evaluation.  Unless visual acuity were to decline

## 2021-01-13 NOTE — Assessment & Plan Note (Signed)
No sign of CNVM OS by OCT exam today

## 2021-01-13 NOTE — Progress Notes (Signed)
01/13/2021     CHIEF COMPLAINT Patient presents for  Chief Complaint  Patient presents with   Retina Follow Up    10 week fu OD and Avastin OD Pt states VA OU stable since last visit. Pt denies FOL, floaters, or ocular pain OU.        HISTORY OF PRESENT ILLNESS: Susan Mcneil is a 75 y.o. female who presents to the clinic today for:   HPI     Retina Follow Up   Patient presents with  Wet AMD.  In right eye.  This started 12 weeks ago.  Severity is mild.  Duration of 12 weeks.  Since onset it is stable. Additional comments: 10 week fu OD and Avastin OD Pt states VA OU stable since last visit. Pt denies FOL, floaters, or ocular pain OU.          Comments   12 weeks fu od oct avastin od. Patient states vision is stable and unchanged since last visit. Denies any new floaters or FOL.       Last edited by Laurin Coder on 01/13/2021 10:17 AM.      Referring physician: Kathyrn Drown, MD [address] [place],  [place] [address]  HISTORICAL INFORMATION:   Selected notes from the [place]: No current outpatient medications on file. (Ophthalmic Drugs)   No current facility-administered medications for this visit. (Ophthalmic Drugs)   Current Outpatient Medications (Other)  Medication Sig   donepezil (ARICEPT) 5 MG tablet TAKE 1 TABLET BY MOUTH DAILY   levothyroxine (SYNTHROID) 75 MCG tablet Take 1 tablet (75 mcg total) by mouth daily.   memantine (NAMENDA) 10 MG tablet Take 1 tablet (10 mg total) by mouth 2 (two) times daily.   rosuvastatin (CRESTOR) 10 MG tablet TAKE 1 TABLET(10 MG) BY MOUTH AT BEDTIME   No current facility-administered medications for this visit. 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   Followed by [facility],  most recent testing shows it is stable, they recommending follow-up in 2 years, June 14, 2017-follow-up will be in 2021   Fatty liver    HTN (hypertension)    cholesterol & thyroid   Hypothyroidism    Kidney stone    Macular degeneration    Macular degeneration, bilateral    Memory disturbance    Neovascular glaucoma, right eye, severe stage 09/04/2019   Osteopenia    Osteoporosis    Reflux gastritis    Thyroid disorder    Past Surgical History:  Procedure Laterality Date   ABDOMINAL HYSTERECTOMY   patial, pre-uterine cancer, 1974   BIOPSY  10/27/2016   Procedure: BIOPSY;  Surgeon: Rogene Houston, MD;  [facility];  Service: Endoscopy;;  esophagus   BIOPSY  01/17/2018   Procedure: BIOPSY;  Surgeon: Rogene Houston, MD;  [facility];  Service: Endoscopy;;  colon   COLONOSCOPY N/A 04/01/2015   Procedure: COLONOSCOPY;  Surgeon: Rogene Houston, MD;  [facility];  Service: Endoscopy;  Laterality: N/A;  1030   COLONOSCOPY N/A 01/17/2018   Procedure: COLONOSCOPY;  Surgeon: Rogene Houston, MD;  [facility];  Service: Endoscopy;  Laterality: N/A;  9:30   DIAGNOSTIC LAPAROSCOPY  ESOPHAGOGASTRODUODENOSCOPY N/A 10/27/2016   Procedure: ESOPHAGOGASTRODUODENOSCOPY (EGD);  Surgeon: Rogene Houston, MD;  [facility];  Service: Endoscopy;  Laterality: N/A;  100   TOTAL ABDOMINAL HYSTERECTOMY W/ BILATERAL SALPINGOOPHORECTOMY      FAMILY HISTORY Family History  Problem Relation Age of Onset   Cancer Mother    Stroke Father     SOCIAL HISTORY Social History   Tobacco Use   Smoking status: Former    Types: Cigarettes    Quit date: 12/07/2000    Years since quitting: 20.1   Smokeless tobacco: Never  Substance Use Topics   Alcohol use: No   Drug use: No         OPHTHALMIC EXAM:  Base Eye Exam     Visual Acuity (ETDRS)       Right Left   Dist [place] 20/50 20/20   Dist ph [place] NI          Tonometry (Tonopen, 10:24  AM)       Right Left   Pressure 14 13         Pupils       Pupils Dark Light React APD   Right PERRL 3 2 Minimal None   Left PERRL 2 2 Minimal None         Extraocular Movement       Right Left    Full Full         Neuro/Psych     Oriented x3: Yes   Mood/Affect: Disoriented         Dilation     Right eye: 1.0% Mydriacyl, 2.5% Phenylephrine @ 10:24 AM           Slit Lamp and Fundus Exam     External Exam       Right Left   External Normal Normal         Slit Lamp Exam       Right Left   Lids/Lashes Normal Normal   Conjunctiva/Sclera White and quiet White and quiet   Cornea Clear Clear   Anterior Chamber Deep and quiet Deep and quiet   Iris Round and reactive Round and reactive   Lens Posterior chamber intraocular lens Posterior chamber intraocular lens   Anterior Vitreous Normal Normal         Fundus Exam       Right Left   Posterior Vitreous Posterior vitreous detachment    Disc Normal    C/D Ratio 0.7    Macula Atrophy, Geographic atrophy, Disciform scar with dark red large neovascular trunk which is mature new blood supply to the fovea,, Cystoid macular edema, Macular thickening, no exudates, no hemorrhage    Vessels Normal    Periphery Normal             IMAGING AND PROCEDURES  Imaging and Procedures for 01/13/21  OCT, Retina - OU - Both Eyes       Right Eye Quality was good. Scan locations included subfoveal. Central Foveal Thickness: 33. Progression has improved. Findings include cystoid macular edema, pigment epithelial detachment, disciform scar, abnormal foveal contour.   Left Eye Quality was borderline. Scan locations included subfoveal. Central Foveal Thickness: 2. Progression has been stable. Findings include pigment epithelial detachment, abnormal foveal contour, retinal drusen .   Notes OD with chronic cystoid intraretinal fluid change overlying a vascularized pigment epithelial detachment. Currently stable  12-week interval of examination post Avastin will repeat injection today  OS no signs of active CNVM, incidental  posterior vitreous detachment is visualized, baseline RPE to retinal evaluation slightly increased today yet not reliable as compared to baseline     Intravitreal Injection, Pharmacologic Agent - OD - Right Eye       Time Out 01/13/2021. 10:54 AM. Confirmed correct patient, procedure, site, and patient consented.   Anesthesia Topical anesthesia was used. Anesthetic medications included [place] 3.5%.   Procedure Preparation included Tobramycin 0.3%, 10% betadine to eyelids, 5% betadine to ocular surface. A 30 gauge needle was used.   Injection: 2.5 mg bevacizumab 2.5 MG/0.1ML   Route: Intravitreal, Site: Right Eye   NDC: [phone], Lot: [id number]   Post-op Post injection exam found visual acuity of at least counting fingers. The patient tolerated the procedure well. There were no complications. The patient received written and verbal post procedure care education. Post injection medications were not given.              ASSESSMENT/PLAN:  Intermediate stage nonexudative age-related macular degeneration of left eye No sign of CNVM OS by OCT exam today  Exudative age-related macular degeneration of right eye with active choroidal neovascularization (HCC) OD, chronic subfoveal white disciform scar clinically, confirmed on OCT yet with intact visual elements and intraretinal fluid remaining stable at 12-week follow-up interval post Avastin.  Will repeat injection today and maintain 12-week maintenance evaluation.  Unless visual acuity were to decline     ICD-10-CM   1. Exudative age-related macular degeneration of right eye with active choroidal neovascularization (HCC)  H35.3211 OCT, Retina - OU - Both Eyes    Intravitreal Injection, Pharmacologic Agent - OD - Right Eye    bevacizumab (AVASTIN) SOSY 2.5 mg    2. Intermediate stage nonexudative age-related macular  degeneration of left eye  H35.3122       1.  OD with chronic active subfoveal disciform scar with elements threatening visual acuity.  Nonetheless these have remained stable with no intrusion into the retina and stable old intraretinal fluid at 12-week follow-up interval post Avastin today.  We will repeat injection today and examination next in 12 weeks OU  2.  Dilate OU next likely Avastin OD next  3.  Ophthalmic Meds Ordered this visit:  Meds ordered this encounter  Medications   bevacizumab (AVASTIN) SOSY 2.5 mg       Return in about 12 weeks (around 04/07/2021) for DILATE OU, AVASTIN OCT, OD.  There are no Patient Instructions on file for this visit.   Explained the diagnoses, plan, and follow up with the patient and they expressed understanding.  Patient expressed understanding of the importance of proper follow up care.   Clent Demark Jalaiyah Throgmorton M.D. Diseases & Surgery of the Retina and Vitreous [facility] [place] 01/13/21     Abbreviations: M myopia (nearsighted); A astigmatism; H hyperopia (farsighted); P presbyopia; Mrx spectacle prescription;  CTL contact lenses; OD right eye; OS left eye; OU both eyes  XT exotropia; ET esotropia; PEK punctate epithelial keratitis; PEE punctate epithelial erosions; DES dry eye syndrome; MGD meibomian gland dysfunction; ATs artificial tears; PFAT's preservative free artificial tears; [place] nuclear sclerotic cataract; PSC posterior subcapsular cataract; ERM epi-retinal membrane; PVD posterior vitreous detachment; RD retinal detachment; DM diabetes mellitus; DR diabetic retinopathy; NPDR non-proliferative diabetic retinopathy; PDR proliferative diabetic retinopathy; CSME clinically significant macular edema; DME diabetic macular edema; dbh dot blot hemorrhages; CWS cotton wool spot; POAG primary open angle glaucoma; C/D cup-to-disc ratio; HVF humphrey visual field; GVF goldmann visual field; OCT optical coherence tomography; IOP intraocular  pressure; BRVO Branch retinal vein occlusion; CRVO central retinal vein occlusion; CRAO central retinal artery occlusion; BRAO branch retinal artery occlusion; RT retinal tear; SB scleral buckle; PPV pars plana vitrectomy; VH Vitreous hemorrhage; PRP panretinal laser photocoagulation; IVK intravitreal kenalog; VMT vitreomacular traction; MH Macular hole;  NVD neovascularization of the disc; NVE neovascularization elsewhere; AREDS age related eye disease study; ARMD age related macular degeneration; POAG primary open angle glaucoma; EBMD epithelial/anterior basement membrane dystrophy; ACIOL anterior chamber intraocular lens; IOL intraocular lens; PCIOL posterior chamber intraocular lens; Phaco/IOL phacoemulsification with intraocular lens placement; [place] photorefractive keratectomy; LASIK laser assisted in situ keratomileusis; HTN hypertension; DM diabetes mellitus; COPD chronic obstructive pulmonary disease

## 2021-01-26 ENCOUNTER — Telehealth: Payer: Self-pay | Admitting: Family Medicine

## 2021-01-26 ENCOUNTER — Other Ambulatory Visit: Payer: Self-pay | Admitting: *Deleted

## 2021-01-26 DIAGNOSIS — F0280 Dementia in other diseases classified elsewhere without behavioral disturbance: Secondary | ICD-10-CM

## 2021-01-26 MED ORDER — MEMANTINE HCL 10 MG PO TABS: 10.0000 mg | ORAL_TABLET | Freq: Two times a day (BID) | ORAL | 1 refills | Status: DC

## 2021-01-26 NOTE — Telephone Encounter (Signed)
[name] called for his wife, Susan Mcneil stating that she needs menantine 10mg  refilled and has changed insure to [employer] and uses mail order and not [employer].   CB#  [phone]

## 2021-01-27 ENCOUNTER — Telehealth: Payer: Self-pay | Admitting: Family Medicine

## 2021-01-27 NOTE — Telephone Encounter (Signed)
  Left message for patient to call back and schedule Medicare Annual Wellness Visit (AWV) in office.   If unable to come into the office for AWV,  please offer to do virtually or by telephone.  No hx of AWV eligible for AWVI as of 06/09/2013  Please schedule at anytime with [facility].      40 Minutes appointment   Any questions, please call me at [phone]

## 2021-02-08 ENCOUNTER — Telehealth: Payer: Self-pay | Admitting: Family Medicine

## 2021-02-08 MED ORDER — LEVOTHYROXINE SODIUM 75 MCG PO TABS: 75.0000 ug | ORAL_TABLET | Freq: Every day | ORAL | 0 refills | Status: DC

## 2021-02-08 NOTE — Telephone Encounter (Signed)
Prescription sent electronically to pharmacy. Patient notified. 

## 2021-02-08 NOTE — Telephone Encounter (Signed)
Patient is requesting refill on levothyroxine 75 mg sent to [facility]. Husband states states patient is completely out.

## 2021-02-17 ENCOUNTER — Telehealth: Payer: Self-pay | Admitting: Family Medicine

## 2021-02-17 MED ORDER — DONEPEZIL HCL 5 MG PO TABS: ORAL_TABLET | ORAL | 0 refills | Status: DC

## 2021-02-17 NOTE — Telephone Encounter (Signed)
Patient's husband called to get donepezil refilled for wife.   CB#  [phone]  [facility]

## 2021-03-04 ENCOUNTER — Telehealth: Payer: Self-pay | Admitting: Family Medicine

## 2021-03-04 MED ORDER — ROSUVASTATIN CALCIUM 10 MG PO TABS: ORAL_TABLET | ORAL | 1 refills | Status: DC

## 2021-03-04 NOTE — Telephone Encounter (Signed)
Patient's husband stated that patient needed a refill on rosuvastatin. He has not received the synthroid but it looks like it was ordered 10/3. Will be contacting prescription service.   [facility]  [phone]

## 2021-03-19 ENCOUNTER — Telehealth: Payer: Self-pay | Admitting: Family Medicine

## 2021-03-19 NOTE — Telephone Encounter (Signed)
  Line Busy  unable to leave a message for patient to call back and schedule Medicare Annual Wellness Visit (AWV) in office.   If unable to come into the office for AWV,  please offer to do virtually or by telephone.  No hx of AWV eligible for AWVI as of 06/09/2013 per [facility]  Please schedule at anytime with [facility].      40 Minutes appointment   Any questions, please call me at [phone]

## 2021-04-07 ENCOUNTER — Encounter (INDEPENDENT_AMBULATORY_CARE_PROVIDER_SITE_OTHER): Payer: Self-pay | Admitting: Ophthalmology

## 2021-04-07 ENCOUNTER — Ambulatory Visit (INDEPENDENT_AMBULATORY_CARE_PROVIDER_SITE_OTHER): Payer: Medicare Other | Admitting: Ophthalmology

## 2021-04-07 ENCOUNTER — Other Ambulatory Visit: Payer: Self-pay

## 2021-04-07 DIAGNOSIS — H353211 Exudative age-related macular degeneration, right eye, with active choroidal neovascularization: Secondary | ICD-10-CM | POA: Diagnosis not present

## 2021-04-07 DIAGNOSIS — H353122 Nonexudative age-related macular degeneration, left eye, intermediate dry stage: Secondary | ICD-10-CM | POA: Diagnosis not present

## 2021-04-07 MED ORDER — BEVACIZUMAB 2.5 MG/0.1ML IZ SOSY
2.5000 mg | PREFILLED_SYRINGE | INTRAVITREAL | Status: AC | PRN
Start: 2021-04-07 — End: 2021-04-07
  Administered 2021-04-07: 2.5 mg via INTRAVITREAL

## 2021-04-07 NOTE — Assessment & Plan Note (Addendum)
Follow-up today with stable acuityThe nature of age--related macular degeneration was discussed with the patient as well as the distinction between dry and wet types. Checking an Amsler Grid daily with advice to return immediately should a distortion develop, was given to the patient. The patient 's smoking status now and in the past was determined and advice based on the AREDS study was provided regarding the consumption of antioxidant supplements. AREDS 2 vitamin formulation was recommended. Consumption of dark leafy vegetables and fresh fruits of various colors was recommended. Treatment modalities for wet macular degeneration particularly the use of intravitreal injections of anti-blood vessel growth factors was discussed with the patient. Avastin, Lucentis, and Eylea are the available options. On occasion, therapy includes the use of photodynamic therapy and thermal laser. Stressed to the patient do not rub eyes.  Patient was advised to check Amsler Grid daily and return immediately if changes are noted. Instructions on using the grid were given to the patient. All patient questions were answered.  No sign of CNVM OS

## 2021-04-07 NOTE — Assessment & Plan Note (Addendum)
OD with subfoveal CNVM.  Chronic active in the past disciform scar, stable at 12-week interval, with reddish hue subretinal hemorrhage, so therefore we will treat again today to maintain and prevent recurrence or progression

## 2021-04-07 NOTE — Progress Notes (Signed)
04/07/2021     CHIEF COMPLAINT Patient presents for  Chief Complaint  Patient presents with   Macular Degeneration      HISTORY OF PRESENT ILLNESS: Susan Mcneil is a 75 y.o. female who presents to the clinic today for:   HPI   OD today at 12 weeks postinjection Avastin to control chronic disciform scar subfoveal from wet AMD  OS follow-up today for dry AMD Last edited by Susan Horn, MD on 04/07/2021 11:16 AM.      Referring physician: Kathyrn Drown, MD [address] [place],  [place] [address]  HISTORICAL INFORMATION:   Selected notes from the [facility]: No current outpatient medications on file. (Ophthalmic Drugs)   No current facility-administered medications for this visit. (Ophthalmic Drugs)   Current Outpatient Medications (Other)  Medication Sig   donepezil (ARICEPT) 5 MG tablet TAKE 1 TABLET BY MOUTH DAILY   levothyroxine (SYNTHROID) 75 MCG tablet Take 1 tablet (75 mcg total) by mouth daily.   memantine (NAMENDA) 10 MG tablet Take 1 tablet (10 mg total) by mouth 2 (two) times daily.   rosuvastatin (CRESTOR) 10 MG tablet TAKE 1 TABLET(10 MG) BY MOUTH AT BEDTIME   No current facility-administered medications for this visit. 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   Followed by [facility], most recent testing shows it is stable, they recommending follow-up in 2 years, June 14, 2017-follow-up will be in 2021   Fatty liver    HTN (hypertension)    cholesterol & thyroid   Hypothyroidism    Kidney stone    Macular degeneration    Macular degeneration, bilateral    Memory disturbance    Neovascular glaucoma, right eye, severe stage 09/04/2019   Osteopenia    Osteoporosis    Reflux gastritis    Thyroid disorder    Past  Surgical History:  Procedure Laterality Date   ABDOMINAL HYSTERECTOMY   patial, pre-uterine cancer, 1974   BIOPSY  10/27/2016   Procedure: BIOPSY;  Surgeon: Rogene Houston, MD;  [facility];  Service: Endoscopy;;  esophagus   BIOPSY  01/17/2018   Procedure: BIOPSY;  Surgeon: Rogene Houston, MD;  [facility];  Service: Endoscopy;;  colon   COLONOSCOPY N/A 04/01/2015   Procedure: COLONOSCOPY;  Surgeon: Rogene Houston, MD;  [facility];  Service: Endoscopy;  Laterality: N/A;  1030   COLONOSCOPY N/A 01/17/2018   Procedure: COLONOSCOPY;  Surgeon: Rogene Houston, MD;  [facility];  Service: Endoscopy;  Laterality: N/A;  9:30   DIAGNOSTIC LAPAROSCOPY     ESOPHAGOGASTRODUODENOSCOPY N/A 10/27/2016   Procedure: ESOPHAGOGASTRODUODENOSCOPY (EGD);  Surgeon: Rogene Houston, MD;  [facility];  Service: Endoscopy;  Laterality: N/A;  100   TOTAL ABDOMINAL HYSTERECTOMY W/ BILATERAL SALPINGOOPHORECTOMY      FAMILY HISTORY Family History  Problem Relation Age of Onset   Cancer Mother    Stroke Father     SOCIAL HISTORY Social History   Tobacco Use   Smoking status: Former    Types: Cigarettes    Quit date: 12/07/2000    Years since quitting: 20.3   Smokeless tobacco: Never  Substance Use Topics   Alcohol use: No  Drug use: No         OPHTHALMIC EXAM:  Base Eye Exam     Visual Acuity (ETDRS)       Right Left   Dist [place] 20/50 20/25 +2         Tonometry (Tonopen, 11:17 AM)       Right Left   Pressure 15 15         Pupils       Pupils React APD   Right PERRL Brisk None   Left PERRL Brisk None         Visual Fields       Left Right    Full Full         Extraocular Movement       Right Left    Full, Ortho Full, Ortho         Neuro/Psych     Oriented x3: Yes   Mood/Affect: Disoriented         Dilation     Both eyes: 1.0% Mydriacyl, 2.5% Phenylephrine @ 11:17 AM           Slit  Lamp and Fundus Exam     External Exam       Right Left   External Normal Normal         Slit Lamp Exam       Right Left   Lids/Lashes Normal Normal   Conjunctiva/Sclera White and quiet White and quiet   Cornea Clear Clear   Anterior Chamber Deep and quiet Deep and quiet   Iris Round and reactive Round and reactive   Lens Posterior chamber intraocular lens Posterior chamber intraocular lens   Anterior Vitreous Normal Normal         Fundus Exam       Right Left   Posterior Vitreous Posterior vitreous detachment Posterior vitreous detachment   Disc Normal Normal   C/D Ratio 0.7 0.5   Macula Atrophy, Geographic atrophy, Disciform scar with dark red large neovascular trunk which is mature new blood supply to the fovea, Cystoid macular edema, Macular thickening, no exudates, Subretinal hemorrhage on edge of CNVM white scar Intermediate age related macular degeneration, no exudates, no hemorrhage, no macular thickening   Vessels Normal Normal   Periphery Normal Normal            IMAGING AND PROCEDURES  Imaging and Procedures for 04/07/21  OCT, Retina - OU - Both Eyes       Right Eye Quality was good. Scan locations included subfoveal. Central Foveal Thickness: 478. Progression has improved. Findings include cystoid macular edema, pigment epithelial detachment, disciform scar, abnormal foveal contour.   Left Eye Quality was borderline. Scan locations included subfoveal. Central Foveal Thickness: 240. Progression has been stable. Findings include pigment epithelial detachment, abnormal foveal contour, retinal drusen .   Notes OD with chronic cystoid intraretinal fluid change overlying a vascularized pigment epithelial detachment. Currently stable 3 months interval of examination post Avastin will repeat injection today  OS no signs of active CNVM, incidental posterior vitreous detachment is visualized, baseline RPE to retinal evaluation slightly increased today yet not  reliable as compared to baseline     Intravitreal Injection, Pharmacologic Agent - OD - Right Eye       Time Out 04/07/2021. 11:19 AM. Confirmed correct patient, procedure, site, and patient consented.   Anesthesia Topical anesthesia was used. Anesthetic medications included Lidocaine 4%.   Procedure Preparation included Tobramycin 0.3%, 10% betadine to eyelids, 5% betadine  to ocular surface. A 30 gauge needle was used.   Injection: 2.5 mg bevacizumab 2.5 MG/0.1ML   Route: Intravitreal, Site: Right Eye   NDC: [phone]   Post-op Post injection exam found visual acuity of at least counting fingers. The patient tolerated the procedure well. There were no complications. The patient received written and verbal post procedure care education. Post injection medications were not given.              ASSESSMENT/PLAN:  Exudative age-related macular degeneration of right eye with active choroidal neovascularization (HCC) OD with subfoveal CNVM.  Chronic active in the past disciform scar, stable at 12-week interval, with reddish hue subretinal hemorrhage, so therefore we will treat again today to maintain and prevent recurrence or progression  Intermediate stage nonexudative age-related macular degeneration of left eye Follow-up today with stable acuityThe nature of age--related macular degeneration was discussed with the patient as well as the distinction between dry and wet types. Checking an Amsler Grid daily with advice to return immediately should a distortion develop, was given to the patient. The patient 's smoking status now and in the past was determined and advice based on the AREDS study was provided regarding the consumption of antioxidant supplements. AREDS 2 vitamin formulation was recommended. Consumption of dark leafy vegetables and fresh fruits of various colors was recommended. Treatment modalities for wet macular degeneration particularly the use of intravitreal  injections of anti-blood vessel growth factors was discussed with the patient. Avastin, Lucentis, and Eylea are the available options. On occasion, therapy includes the use of photodynamic therapy and thermal laser. Stressed to the patient do not rub eyes.  Patient was advised to check Amsler Grid daily and return immediately if changes are noted. Instructions on using the grid were given to the patient. All patient questions were answered.  No sign of CNVM OS     ICD-10-CM   1. Exudative age-related macular degeneration of right eye with active choroidal neovascularization (HCC)  H35.3211 OCT, Retina - OU - Both Eyes    Intravitreal Injection, Pharmacologic Agent - OD - Right Eye    bevacizumab (AVASTIN) SOSY 2.5 mg    2. Intermediate stage nonexudative age-related macular degeneration of left eye  H35.3122 OCT, Retina - OU - Both Eyes      1.  OD vastly improved overall yet with multiple recurrences over time with subfoveal CNVM and intraretinal fluid.  Today repeat injection to maintain acuity and ability of lesion with repeat Avastin and follow-up again in 3 months  2.  Dilate OU next  3.  Ophthalmic Meds Ordered this visit:  Meds ordered this encounter  Medications   bevacizumab (AVASTIN) SOSY 2.5 mg       Return in about 3 months (around 07/06/2021) for DILATE OU, AVASTIN OCT, OD.  There are no Patient Instructions on file for this visit.   Explained the diagnoses, plan, and follow up with the patient and they expressed understanding.  Patient expressed understanding of the importance of proper follow up care.   Clent Demark Grady Mohabir M.D. Diseases & Surgery of the Retina and Vitreous [facility] 04/07/21     Abbreviations: M myopia (nearsighted); A astigmatism; H hyperopia (farsighted); P presbyopia; Mrx spectacle prescription;  CTL contact lenses; OD right eye; OS left eye; OU both eyes  XT exotropia; ET esotropia; PEK punctate epithelial keratitis; PEE  punctate epithelial erosions; DES dry eye syndrome; MGD meibomian gland dysfunction; ATs artificial tears; PFAT's preservative free artificial tears; [place]  nuclear sclerotic cataract; PSC posterior subcapsular cataract; ERM epi-retinal membrane; PVD posterior vitreous detachment; RD retinal detachment; DM diabetes mellitus; DR diabetic retinopathy; NPDR non-proliferative diabetic retinopathy; PDR proliferative diabetic retinopathy; CSME clinically significant macular edema; DME diabetic macular edema; dbh dot blot hemorrhages; CWS cotton wool spot; POAG primary open angle glaucoma; C/D cup-to-disc ratio; HVF humphrey visual field; GVF goldmann visual field; OCT optical coherence tomography; IOP intraocular pressure; BRVO Branch retinal vein occlusion; CRVO central retinal vein occlusion; CRAO central retinal artery occlusion; BRAO branch retinal artery occlusion; RT retinal tear; SB scleral buckle; PPV pars plana vitrectomy; VH Vitreous hemorrhage; PRP panretinal laser photocoagulation; IVK intravitreal kenalog; VMT vitreomacular traction; MH Macular hole;  NVD neovascularization of the disc; NVE neovascularization elsewhere; AREDS age related eye disease study; ARMD age related macular degeneration; POAG primary open angle glaucoma; EBMD epithelial/anterior basement membrane dystrophy; ACIOL anterior chamber intraocular lens; IOL intraocular lens; PCIOL posterior chamber intraocular lens; Phaco/IOL phacoemulsification with intraocular lens placement; [place] photorefractive keratectomy; LASIK laser assisted in situ keratomileusis; HTN hypertension; DM diabetes mellitus; COPD chronic obstructive pulmonary disease

## 2021-04-08 DIAGNOSIS — E782 Mixed hyperlipidemia: Secondary | ICD-10-CM | POA: Diagnosis not present

## 2021-04-08 DIAGNOSIS — E876 Hypokalemia: Secondary | ICD-10-CM | POA: Diagnosis not present

## 2021-04-09 ENCOUNTER — Ambulatory Visit: Payer: Medicare Other | Admitting: Family Medicine

## 2021-04-09 LAB — LIPID PANEL
Chol/HDL Ratio: 2.6 ratio (ref 0.0–4.4)
Cholesterol, Total: 163 mg/dL (ref 100–199)
HDL: 62 mg/dL (ref >39)
LDL Chol Calc (NIH): 82 mg/dL (ref 0–99)
Triglycerides: 107 mg/dL (ref 0–149)
VLDL Cholesterol Cal: 19 mg/dL (ref 5–40)

## 2021-04-09 LAB — BASIC METABOLIC PANEL
BUN/Creatinine Ratio: 21 (ref 12–28)
BUN: 18 mg/dL (ref 8–27)
CO2: 27 mmol/L (ref 20–29)
Calcium: 9.4 mg/dL (ref 8.7–10.3)
Chloride: 104 mmol/L (ref 96–106)
Creatinine, Ser: 0.84 mg/dL (ref 0.57–1.00)
Glucose: 120 mg/dL — ABNORMAL HIGH (ref 70–99)
Potassium: 3.6 mmol/L (ref 3.5–5.2)
Sodium: 147 mmol/L — ABNORMAL HIGH (ref 134–144)
eGFR: 72 mL/min/{1.73_m2} (ref >59)

## 2021-04-11 ENCOUNTER — Other Ambulatory Visit: Payer: Self-pay | Admitting: Family Medicine

## 2021-04-20 ENCOUNTER — Other Ambulatory Visit: Payer: Self-pay | Admitting: Family Medicine

## 2021-04-21 ENCOUNTER — Ambulatory Visit (INDEPENDENT_AMBULATORY_CARE_PROVIDER_SITE_OTHER): Payer: Medicare Other | Admitting: Family Medicine

## 2021-04-21 ENCOUNTER — Other Ambulatory Visit: Payer: Self-pay

## 2021-04-21 VITALS — BP 122/70 | HR 63 | Temp 98.3°F | Ht 64.0 in | Wt 164.6 lb

## 2021-04-21 DIAGNOSIS — G309 Alzheimer's disease, unspecified: Secondary | ICD-10-CM | POA: Diagnosis not present

## 2021-04-21 DIAGNOSIS — E039 Hypothyroidism, unspecified: Secondary | ICD-10-CM

## 2021-04-21 DIAGNOSIS — N39498 Other specified urinary incontinence: Secondary | ICD-10-CM | POA: Diagnosis not present

## 2021-04-21 DIAGNOSIS — I700 Atherosclerosis of aorta: Secondary | ICD-10-CM | POA: Diagnosis not present

## 2021-04-21 DIAGNOSIS — F0280 Dementia in other diseases classified elsewhere without behavioral disturbance: Secondary | ICD-10-CM

## 2021-04-21 NOTE — Progress Notes (Signed)
° °  Subjective:    Patient ID: Susan Mcneil, female    DOB: Nov 01, 1945, 75 y.o.   MRN: [id number]  Hypertension Follow up  Thoracic aorta atherosclerosis ([place])  Alzheimer disease ([place])  Hypothyroidism, unspecified type  Other urinary incontinence  Not tolerate higher dose of memory medicine Tolerating current dose Taken thyroid medicine on a consistent basis Also taking cholesterol medicine Trying to eat relatively healthy Patient is able to do conversational interaction but memory is not well Reasoning capabilities are impaired Now having incontinence issues with accidents Husband pretty much provides all of her care including bathing clothing feeding   Review of Systems     Objective:   Physical Exam General-in no acute distress Eyes-no discharge Lungs-respiratory rate normal, CTA CV-no murmurs,RRR Extremities skin warm dry no edema Neuro grossly normal Behavior normal, alert        Assessment & Plan:  1. Thoracic aorta atherosclerosis ([place]) Cholesterol medicine as discussed continue current measures watch diet  2. Alzheimer disease ([place]) Continue medications although limited benefit currently  3. Hypothyroidism, unspecified type Continue medication.  Check labs periodically  4. Other urinary incontinence This is related to the Alzheimer's.  We went over some information I printed off some information we discussed more detail how to put her on a bathroom regimen that should help cut down some of the accidents Labs reviewed including cholesterol and metabolic 7 They have a family wedding coming up in [place] 5 hours away.  After long discussion with husband in wife going to this would be less than optimal because of increased risk of worsening of her Alzheimer's because of being in unfamiliar areas.  Also the husband is uncertain how well he would do getting her down there and back as well as if she should become agitated or emotionally upset being away  from home and how he would handle back for the length of 4 to 5 days therefore they are leaning towards staying at home  Follow-up in the spring time follow-up sooner if any problems

## 2021-06-17 ENCOUNTER — Telehealth: Payer: Self-pay | Admitting: Family Medicine

## 2021-06-17 MED ORDER — ROSUVASTATIN CALCIUM 10 MG PO TABS: ORAL_TABLET | ORAL | 0 refills | Status: DC

## 2021-06-17 MED ORDER — DONEPEZIL HCL 5 MG PO TABS: 5.0000 mg | ORAL_TABLET | Freq: Every day | ORAL | 0 refills | Status: DC

## 2021-06-17 NOTE — Telephone Encounter (Signed)
Patient is requesting refill on rosuvastatin 10 mg and donepezil 5 mg called in to [facility]. She has medication follow up on 06/29/21

## 2021-06-17 NOTE — Telephone Encounter (Signed)
Prescriptions sent electronically to pharmacy. Husband(DPR) notified.

## 2021-06-29 ENCOUNTER — Other Ambulatory Visit: Payer: Self-pay

## 2021-06-29 ENCOUNTER — Ambulatory Visit (INDEPENDENT_AMBULATORY_CARE_PROVIDER_SITE_OTHER): Payer: Medicare Other | Admitting: Family Medicine

## 2021-06-29 ENCOUNTER — Encounter: Payer: Self-pay | Admitting: Family Medicine

## 2021-06-29 VITALS — BP 140/88 | Temp 96.6°F | Wt 161.8 lb

## 2021-06-29 DIAGNOSIS — I700 Atherosclerosis of aorta: Secondary | ICD-10-CM

## 2021-06-29 DIAGNOSIS — F0280 Dementia in other diseases classified elsewhere without behavioral disturbance: Secondary | ICD-10-CM

## 2021-06-29 DIAGNOSIS — L309 Dermatitis, unspecified: Secondary | ICD-10-CM | POA: Diagnosis not present

## 2021-06-29 DIAGNOSIS — G309 Alzheimer's disease, unspecified: Secondary | ICD-10-CM

## 2021-06-29 MED ORDER — TRIAMCINOLONE ACETONIDE 0.1 % EX CREA: TOPICAL_CREAM | CUTANEOUS | 1 refills | Status: DC

## 2021-06-29 NOTE — Progress Notes (Signed)
° °  Subjective:    Patient ID: Susan Mcneil, female    DOB: [birth year], 76 y.o.   MRN: [id number]  HPI Pt here for follow up. Pt states she is doing ok. Husband states pt is breaking out on chest; going on about 2 months. Pt reports itching with rash. Pt husband states that she has not had a bath in 2 weeks but she may wash off. Husband states he had 1% cream that was prescribed to him when he had a rash and let pt use the cream for her rash.    Review of Systems     Objective:   Physical Exam Lungs clear heart regular small amount of rash noted between the breast does not appear to be yeast related       Assessment & Plan:  1. Dermatitis Try Kenalog cream twice daily as needed  2. Alzheimer disease ([place]) Progressive Continue current medications Patient not behaving herself as well as she should She also has not eating on a regular basis Encourage family to do 2 meals a day Also encouraged patient to do bathing we will look into see if there is any assistance that could be given to her regarding this  Thoracic aortic atherosclerosis continue statin  Keep follow-up visit in April to recheck weight

## 2021-06-29 NOTE — Progress Notes (Signed)
06/29/21-sent message to Jacqlyn Larsen to see what actions need to be taken

## 2021-06-30 NOTE — Addendum Note (Signed)
Addended by: Vicente Males on: 06/30/2021 08:09 AM   Modules accepted: Orders

## 2021-06-30 NOTE — Progress Notes (Signed)
06/30/21-yes please put in referral and specify social work and what the needs are.  please use [id number]    thanks per Montez Hageman  Referral placed as indicated

## 2021-07-06 ENCOUNTER — Other Ambulatory Visit: Payer: Self-pay

## 2021-07-06 ENCOUNTER — Ambulatory Visit (INDEPENDENT_AMBULATORY_CARE_PROVIDER_SITE_OTHER): Payer: Medicare Other | Admitting: Ophthalmology

## 2021-07-06 ENCOUNTER — Encounter (INDEPENDENT_AMBULATORY_CARE_PROVIDER_SITE_OTHER): Payer: Self-pay | Admitting: Ophthalmology

## 2021-07-06 DIAGNOSIS — H353122 Nonexudative age-related macular degeneration, left eye, intermediate dry stage: Secondary | ICD-10-CM

## 2021-07-06 DIAGNOSIS — H353211 Exudative age-related macular degeneration, right eye, with active choroidal neovascularization: Secondary | ICD-10-CM | POA: Diagnosis not present

## 2021-07-06 MED ORDER — BEVACIZUMAB 2.5 MG/0.1ML IZ SOSY
2.5000 mg | PREFILLED_SYRINGE | INTRAVITREAL | Status: AC | PRN
  Administered 2021-07-06: 2.5 mg via INTRAVITREAL

## 2021-07-06 NOTE — Assessment & Plan Note (Signed)
Examination and evaluation by OCT no sign of CNVM developing left eye

## 2021-07-06 NOTE — Assessment & Plan Note (Signed)
Large subfoveal white disciform scar clinically corresponds with area seen subfoveal on OCT with overlying CME.  Overall stable at 91-month post injection Avastin.  Chronic activity.  Repeat injection today to maintain

## 2021-07-06 NOTE — Progress Notes (Signed)
07/06/2021     CHIEF COMPLAINT Patient presents for  Chief Complaint  Patient presents with   Macular Degeneration      HISTORY OF PRESENT ILLNESS: Susan Mcneil is a 76 y.o. female who presents to the clinic today for:   HPI   3 months dilate OU, Avastin OD, OCT. Patient states vision is stable and unchanged since last visit. Denies any new floaters or FOL.  Last edited by Laurin Coder on 07/06/2021 10:29 AM.      Referring physician: Kathyrn Drown, MD [address] [place],  [place] [address]  HISTORICAL INFORMATION:   Selected notes from the [facility]: No current outpatient medications on file. (Ophthalmic Drugs)   No current facility-administered medications for this visit. (Ophthalmic Drugs)   Current Outpatient Medications (Other)  Medication Sig   donepezil (ARICEPT) 5 MG tablet Take 1 tablet (5 mg total) by mouth daily.   levothyroxine (SYNTHROID) 75 MCG tablet TAKE 1 TABLET BY MOUTH  DAILY   memantine (NAMENDA) 10 MG tablet Take 1 tablet (10 mg total) by mouth 2 (two) times daily.   rosuvastatin (CRESTOR) 10 MG tablet TAKE 1 TABLET(10 MG) BY MOUTH AT BEDTIME   triamcinolone cream (KENALOG) 0.1 % Apply thin amount bid prn to rash   No current facility-administered medications for this visit. (Other)      REVIEW OF SYSTEMS: ROS   Negative for: Constitutional, Gastrointestinal, Neurological, Skin, Genitourinary, Musculoskeletal, HENT, Endocrine, Cardiovascular, Eyes, Respiratory, Psychiatric, Allergic/Imm, Heme/Lymph Last edited by Hurman Horn, MD on 07/06/2021 10:59 AM.       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   Followed by [facility], most recent testing shows it is stable, they recommending follow-up in 2 years, June 14, 2017-follow-up will be in  2021   Fatty liver    HTN (hypertension)    cholesterol & thyroid   Hypothyroidism    Kidney stone    Macular degeneration    Macular degeneration, bilateral    Memory disturbance    Neovascular glaucoma, right eye, severe stage 09/04/2019   Osteopenia    Osteoporosis    Reflux gastritis    Thyroid disorder    Past Surgical History:  Procedure Laterality Date   ABDOMINAL HYSTERECTOMY   patial, pre-uterine cancer, 1974   BIOPSY  10/27/2016   Procedure: BIOPSY;  Surgeon: Rogene Houston, MD;  [facility];  Service: Endoscopy;;  esophagus   BIOPSY  01/17/2018   Procedure: BIOPSY;  Surgeon: Rogene Houston, MD;  [facility];  Service: Endoscopy;;  colon   COLONOSCOPY N/A 04/01/2015   Procedure: COLONOSCOPY;  Surgeon: Rogene Houston, MD;  [facility];  Service: Endoscopy;  Laterality: N/A;  1030   COLONOSCOPY N/A 01/17/2018   Procedure: COLONOSCOPY;  Surgeon: Rogene Houston, MD;  [facility];  Service: Endoscopy;  Laterality: N/A;  9:30   DIAGNOSTIC LAPAROSCOPY     ESOPHAGOGASTRODUODENOSCOPY N/A 10/27/2016   Procedure: ESOPHAGOGASTRODUODENOSCOPY (EGD);  Surgeon: Rogene Houston, MD;  [facility];  Service: Endoscopy;  Laterality: N/A;  100   TOTAL ABDOMINAL HYSTERECTOMY W/ BILATERAL SALPINGOOPHORECTOMY      FAMILY HISTORY Family History  Problem Relation Age of Onset   Cancer Mother    Stroke Father  SOCIAL HISTORY Social History   Tobacco Use   Smoking status: Former    Types: Cigarettes    Quit date: 12/07/2000    Years since quitting: 20.5   Smokeless tobacco: Never  Substance Use Topics   Alcohol use: No   Drug use: No         OPHTHALMIC EXAM:  Base Eye Exam     Visual Acuity (ETDRS)       Right Left   Dist [place] 20/60 -2 20/25 -1+1   Dist ph [place] NI          Tonometry (Tonopen, 10:33 AM)       Right Left   Pressure 16 14         Pupils       Pupils Dark Light APD   Right PERRL 3 2  None   Left PERRL 3 2 None         Extraocular Movement       Right Left    Full Full         Neuro/Psych     Oriented x3: Yes   Mood/Affect: Disoriented         Dilation     Both eyes: 1.0% Mydriacyl, 2.5% Phenylephrine @ 10:33 AM           Slit Lamp and Fundus Exam     External Exam       Right Left   External Normal Normal         Slit Lamp Exam       Right Left   Lids/Lashes Normal Normal   Conjunctiva/Sclera White and quiet White and quiet   Cornea Clear Clear   Anterior Chamber Deep and quiet Deep and quiet   Iris Round and reactive Round and reactive   Lens Posterior chamber intraocular lens Posterior chamber intraocular lens   Anterior Vitreous Normal Normal         Fundus Exam       Right Left   Posterior Vitreous Posterior vitreous detachment Posterior vitreous detachment   Disc Normal Normal   C/D Ratio 0.7 0.5   Macula Atrophy, Geographic atrophy, Disciform scar with dark red large neovascular trunk which is mature new blood supply to the fovea, Cystoid macular edema, Macular thickening, no exudates, Subretinal hemorrhage on edge of CNVM white scar Intermediate age related macular degeneration, no exudates, no hemorrhage, no macular thickening   Vessels Normal Normal   Periphery Normal Normal            IMAGING AND PROCEDURES  Imaging and Procedures for 07/06/21  Intravitreal Injection, Pharmacologic Agent - OD - Right Eye       Time Out 07/06/2021. 11:01 AM. Confirmed correct patient, procedure, site, and patient consented.   Anesthesia Topical anesthesia was used. Anesthetic medications included Lidocaine 4%.   Procedure Preparation included Tobramycin 0.3%, 10% betadine to eyelids, 5% betadine to ocular surface. A 30 gauge needle was used.   Injection: 2.5 mg bevacizumab 2.5 MG/0.1ML   Route: Intravitreal, Site: Right Eye   NDC: [phone], Lot: [id number]   Post-op Post injection exam found visual acuity of at  least counting fingers. The patient tolerated the procedure well. There were no complications. The patient received written and verbal post procedure care education. Post injection medications included gentamicin.      OCT, Retina - OU - Both Eyes       Right Eye Quality was good. Scan locations included subfoveal. Central Foveal Thickness: 335.  Progression has improved. Findings include cystoid macular edema, pigment epithelial detachment, disciform scar, abnormal foveal contour.   Left Eye Quality was borderline. Scan locations included subfoveal. Central Foveal Thickness: 233. Progression has been stable. Findings include pigment epithelial detachment, abnormal foveal contour, retinal drusen .   Notes OD with chronic cystoid intraretinal fluid change overlying a vascularized pigment epithelial detachment. Currently stable 3 months interval of examination post Avastin will repeat injection today  OS no signs of active CNVM, incidental posterior vitreous detachment is visualized, baseline RPE to retinal evaluation slightly increased today yet not reliable as compared to baseline             ASSESSMENT/PLAN:  Intermediate stage nonexudative age-related macular degeneration of left eye Examination and evaluation by OCT no sign of CNVM developing left eye  Exudative age-related macular degeneration of right eye with active choroidal neovascularization (HCC) Large subfoveal white disciform scar clinically corresponds with area seen subfoveal on OCT with overlying CME.  Overall stable at 57-month post injection Avastin.  Chronic activity.  Repeat injection today to maintain     ICD-10-CM   1. Exudative age-related macular degeneration of right eye with active choroidal neovascularization (HCC)  H35.3211 Intravitreal Injection, Pharmacologic Agent - OD - Right Eye    OCT, Retina - OU - Both Eyes    bevacizumab (AVASTIN) SOSY 2.5 mg    2. Intermediate stage nonexudative age-related  macular degeneration of left eye  H35.3122       1.  OS doing well stable acuity  2.  OD stable acuity, with chronic active subfoveal white disciform scar, controlled at 71-month interval.  Repeat Avastin today  3.  Ophthalmic Meds Ordered this visit:  Meds ordered this encounter  Medications   bevacizumab (AVASTIN) SOSY 2.5 mg       Return in about 4 months (around 11/03/2021) for DILATE OU, AVASTIN OCT, OD.  There are no Patient Instructions on file for this visit.   Explained the diagnoses, plan, and follow up with the patient and they expressed understanding.  Patient expressed understanding of the importance of proper follow up care.   Clent Demark Megann Easterwood M.D. Diseases & Surgery of the Retina and Vitreous [facility] [place] 07/06/21     Abbreviations: M myopia (nearsighted); A astigmatism; H hyperopia (farsighted); P presbyopia; Mrx spectacle prescription;  CTL contact lenses; OD right eye; OS left eye; OU both eyes  XT exotropia; ET esotropia; PEK punctate epithelial keratitis; PEE punctate epithelial erosions; DES dry eye syndrome; MGD meibomian gland dysfunction; ATs artificial tears; PFAT's preservative free artificial tears; [place] nuclear sclerotic cataract; PSC posterior subcapsular cataract; ERM epi-retinal membrane; PVD posterior vitreous detachment; RD retinal detachment; DM diabetes mellitus; DR diabetic retinopathy; NPDR non-proliferative diabetic retinopathy; PDR proliferative diabetic retinopathy; CSME clinically significant macular edema; DME diabetic macular edema; dbh dot blot hemorrhages; CWS cotton wool spot; POAG primary open angle glaucoma; C/D cup-to-disc ratio; HVF humphrey visual field; GVF goldmann visual field; OCT optical coherence tomography; IOP intraocular pressure; BRVO Branch retinal vein occlusion; CRVO central retinal vein occlusion; CRAO central retinal artery occlusion; BRAO branch retinal artery occlusion; RT retinal tear; SB scleral buckle;  PPV pars plana vitrectomy; VH Vitreous hemorrhage; PRP panretinal laser photocoagulation; IVK intravitreal kenalog; VMT vitreomacular traction; MH Macular hole;  NVD neovascularization of the disc; NVE neovascularization elsewhere; AREDS age related eye disease study; ARMD age related macular degeneration; POAG primary open angle glaucoma; EBMD epithelial/anterior basement membrane dystrophy; ACIOL anterior chamber intraocular lens; IOL intraocular lens;  PCIOL posterior chamber intraocular lens; Phaco/IOL phacoemulsification with intraocular lens placement; [place] photorefractive keratectomy; LASIK laser assisted in situ keratomileusis; HTN hypertension; DM diabetes mellitus; COPD chronic obstructive pulmonary disease

## 2021-07-14 ENCOUNTER — Other Ambulatory Visit: Payer: Self-pay | Admitting: Family Medicine

## 2021-07-21 ENCOUNTER — Other Ambulatory Visit: Payer: Self-pay | Admitting: Family Medicine

## 2021-08-23 ENCOUNTER — Ambulatory Visit (INDEPENDENT_AMBULATORY_CARE_PROVIDER_SITE_OTHER): Payer: Medicare Other | Admitting: Family Medicine

## 2021-08-23 VITALS — BP 140/78 | HR 82 | Temp 97.5°F | Ht 64.0 in | Wt 162.4 lb

## 2021-08-23 DIAGNOSIS — G309 Alzheimer's disease, unspecified: Secondary | ICD-10-CM | POA: Diagnosis not present

## 2021-08-23 DIAGNOSIS — F02B0 Dementia in other diseases classified elsewhere, moderate, without behavioral disturbance, psychotic disturbance, mood disturbance, and anxiety: Secondary | ICD-10-CM | POA: Diagnosis not present

## 2021-08-23 DIAGNOSIS — E039 Hypothyroidism, unspecified: Secondary | ICD-10-CM | POA: Diagnosis not present

## 2021-08-23 DIAGNOSIS — F0280 Dementia in other diseases classified elsewhere without behavioral disturbance: Secondary | ICD-10-CM

## 2021-08-23 DIAGNOSIS — G308 Other Alzheimer's disease: Secondary | ICD-10-CM

## 2021-08-23 MED ORDER — MEMANTINE HCL 10 MG PO TABS: 10.0000 mg | ORAL_TABLET | Freq: Two times a day (BID) | ORAL | 1 refills | Status: DC

## 2021-08-23 MED ORDER — LEVOTHYROXINE SODIUM 75 MCG PO TABS: 75.0000 ug | ORAL_TABLET | Freq: Every day | ORAL | 1 refills | Status: DC

## 2021-08-23 NOTE — Progress Notes (Signed)
? ?  Subjective:  ? ? Patient ID: Susan Mcneil, female    DOB: 10/28/1945, 76 y.o.   MRN: [id number] ? ?HPI ? ?Patient here for 4 month follow up ?Moderate Alzheimer's dementia of other onset, unspecified whether behavioral, psychotic, or mood disturbance or anxiety ([place]) ? ?Alzheimer disease ([place]) - Plan: memantine (NAMENDA) 10 MG tablet ? ?Hypothyroidism, unspecified type - Plan: TSH, T4, free ? ?Husband states she gives her medicine on a regular basis ?She does eat fairly well ?They often order out food ?She is having progressive dementia with increasing memory dysfunction and memory issues ?Controls her bladder for the most part but does have some accidents ?Does not behave often ?Husband tries to help ?Children do not come around to visit much ? ?Review of Systems ? ?   ?Objective:  ? Physical Exam ?General-in no acute distress ?Eyes-no discharge ?Lungs-respiratory rate normal, CTA ?CV-no murmurs,RRR ?Extremities skin warm dry no edema ?Neuro grossly normal ?Behavior normal, alert ? ? ? ? ?   ?Assessment & Plan:  ? ?1. Moderate Alzheimer's dementia of other onset, unspecified whether behavioral, psychotic, or mood disturbance or anxiety ([place]) ?Progressive dementia.  Unlikely to ever get to the point of improving.  Continue medications for now. ? ?2. Alzheimer disease ([place]) ?Moderate to severe dementia.  Moods overall are doing well.  Continue current medication.  Follow-up within 5 months ?- memantine (NAMENDA) 10 MG tablet; Take 1 tablet (10 mg total) by mouth 2 (two) times daily.  Dispense: 180 tablet; Refill: 1 ? ?3. Hypothyroidism, unspecified type ?Check lab work await the results. ?- TSH ?- T4, free ? ?25 minutes spent with family discussing hypothyroidism Alzheimer's and approach ?

## 2021-08-24 LAB — TSH: TSH: 5.98 u[IU]/mL — ABNORMAL HIGH (ref 0.450–4.500)

## 2021-08-24 LAB — T4, FREE: Free T4: 1.27 ng/dL (ref 0.82–1.77)

## 2021-08-30 ENCOUNTER — Telehealth: Payer: Self-pay | Admitting: Family Medicine

## 2021-08-30 ENCOUNTER — Other Ambulatory Visit: Payer: Self-pay | Admitting: *Deleted

## 2021-08-30 MED ORDER — LEVOTHYROXINE SODIUM 75 MCG PO TABS: ORAL_TABLET | ORAL | 1 refills | Status: DC

## 2021-08-30 NOTE — Telephone Encounter (Signed)
?  Left message for patient to call back and schedule Medicare Annual Wellness Visit (AWV) in office.  ? ?If unable to come into the office for AWV,  please offer to do virtually or by telephone. ? ?No hx of AWV eligible for AWVI per [facility] as of 06/09/2013 ? ?Please schedule at anytime with [facility].     ? ?45 minute appointment  ? ?Any questions, please call me at [phone]   ?

## 2021-08-30 NOTE — Addendum Note (Signed)
Addended by: Madelin Rear on: 08/30/2021 03:53 PM ? ? Modules accepted: Orders ? ?

## 2021-10-24 ENCOUNTER — Other Ambulatory Visit: Payer: Self-pay | Admitting: Family Medicine

## 2021-10-24 DIAGNOSIS — F0280 Dementia in other diseases classified elsewhere without behavioral disturbance: Secondary | ICD-10-CM

## 2021-10-29 ENCOUNTER — Other Ambulatory Visit: Payer: Self-pay | Admitting: *Deleted

## 2021-10-29 MED ORDER — ROSUVASTATIN CALCIUM 10 MG PO TABS: ORAL_TABLET | ORAL | 0 refills | Status: DC

## 2021-11-04 ENCOUNTER — Encounter (INDEPENDENT_AMBULATORY_CARE_PROVIDER_SITE_OTHER): Payer: Self-pay | Admitting: Ophthalmology

## 2021-11-04 ENCOUNTER — Ambulatory Visit (INDEPENDENT_AMBULATORY_CARE_PROVIDER_SITE_OTHER): Payer: Medicare Other | Admitting: Ophthalmology

## 2021-11-04 DIAGNOSIS — H35351 Cystoid macular degeneration, right eye: Secondary | ICD-10-CM

## 2021-11-04 DIAGNOSIS — H43812 Vitreous degeneration, left eye: Secondary | ICD-10-CM

## 2021-11-04 DIAGNOSIS — H353211 Exudative age-related macular degeneration, right eye, with active choroidal neovascularization: Secondary | ICD-10-CM

## 2021-11-04 DIAGNOSIS — H353122 Nonexudative age-related macular degeneration, left eye, intermediate dry stage: Secondary | ICD-10-CM

## 2021-11-04 DIAGNOSIS — H43811 Vitreous degeneration, right eye: Secondary | ICD-10-CM | POA: Diagnosis not present

## 2021-11-04 MED ORDER — BEVACIZUMAB 2.5 MG/0.1ML IZ SOSY
2.5000 mg | PREFILLED_SYRINGE | INTRAVITREAL | Status: AC | PRN
  Administered 2021-11-04: 2.5 mg via INTRAVITREAL

## 2021-11-04 NOTE — Assessment & Plan Note (Signed)
Physiologic OS continue to observe

## 2021-11-04 NOTE — Assessment & Plan Note (Signed)
OD physiologic continue to observe

## 2021-11-04 NOTE — Assessment & Plan Note (Signed)
Chronic active white disciform scar, with overlying CME controlled however still at extended interval of 4 months.  We will repeat evaluation next OD in 4 months

## 2021-11-04 NOTE — Assessment & Plan Note (Signed)
Retains excellent acuity, with subfoveal drusenoid change

## 2021-11-04 NOTE — Assessment & Plan Note (Signed)
Component of the wet AMD underlying CNVM

## 2021-11-04 NOTE — Progress Notes (Signed)
11/04/2021     CHIEF COMPLAINT Patient presents for  Chief Complaint  Patient presents with   Macular Degeneration      HISTORY OF PRESENT ILLNESS: Susan Mcneil is a 76 y.o. female who presents to the clinic today for:   HPI   4 MOS for DILATE OU, AVASTIN OCT, OD. Pt stated vision has not changed since last visit.  Last edited by Silvestre Moment on 11/04/2021  9:40 AM.      Referring physician: Kathyrn Drown, MD [address] [place],  [place] [address]  HISTORICAL INFORMATION:   Selected notes from the MEDICAL RECORD NUMBER       CURRENT MEDICATIONS: No current outpatient medications on file. (Ophthalmic Drugs)   No current facility-administered medications for this visit. (Ophthalmic Drugs)   Current Outpatient Medications (Other)  Medication Sig   donepezil (ARICEPT) 5 MG tablet TAKE 1 TABLET(5 MG) BY MOUTH DAILY   levothyroxine (SYNTHROID) 75 MCG tablet Take 1 tablet (75 mcg total) by mouth daily.   levothyroxine (SYNTHROID) 75 MCG tablet Take 1 tablet by mouth daily Monday through Saturday and 1 1/2 tablet on Sunday.   memantine (NAMENDA) 10 MG tablet TAKE 1 TABLET BY MOUTH TWICE  DAILY   rosuvastatin (CRESTOR) 10 MG tablet TAKE 1 TABLET(10 MG) BY MOUTH AT BEDTIME   triamcinolone cream (KENALOG) 0.1 % Apply thin amount bid prn to rash   No current facility-administered medications for this visit. (Other)      REVIEW OF SYSTEMS: ROS   Negative for: Constitutional, Gastrointestinal, Neurological, Skin, Genitourinary, Musculoskeletal, HENT, Endocrine, Cardiovascular, Eyes, Respiratory, Psychiatric, Allergic/Imm, Heme/Lymph Last edited by Silvestre Moment on 11/04/2021  9:40 AM.       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   Follow[facility] by [facility], most recent testing shows it is stable, they recommending follow-up in 2  years, June 14, 2017-follow-up will be in 2021   Fatty liver    HTN (hypertension)    cholesterol & thyroid   Hypothyroidism    Kidney stone    Macular degeneration    Macular degeneration, bilateral    Memory disturbance    Neovascular glaucoma, right eye, severe stage 09/04/2019   Osteopenia    Osteoporosis    Reflux gastritis    Thyroid disorder    Past Surgical History:  Procedure Laterality Date   ABDOMINAL HYSTERECTOMY   patial, pre-uterine cancer, 1974   BIOPSY  10/27/2016   Procedure: BIOPSY;  Surgeon: Rogene Houston, MD;  [facility];  Service: Endoscopy;;  esophagus   BIOPSY  01/17/2018   Procedure: BIOPSY;  Surgeon: Rogene Houston, MD;  [facility];  Service: Endoscopy;;  colon   COLONOSCOPY N/A 04/01/2015   Procedure: COLONOSCOPY;  Surgeon: Rogene Houston, MD;  [facility];  Service: Endoscopy;  Laterality: N/A;  1030   COLONOSCOPY N/A 01/17/2018   Procedure: COLONOSCOPY;  Surgeon: Rogene Houston, MD;  [facility];  Service: Endoscopy;  Laterality: N/A;  9:30   DIAGNOSTIC LAPAROSCOPY     ESOPHAGOGASTRODUODENOSCOPY N/A 10/27/2016   Procedure: ESOPHAGOGASTRODUODENOSCOPY (EGD);  Surgeon: Rogene Houston, MD;  [facility];  Service: Endoscopy;  Laterality: N/A;  100   TOTAL ABDOMINAL HYSTERECTOMY W/ BILATERAL SALPINGOOPHORECTOMY      FAMILY HISTORY Family History  Problem Relation Age of  Onset   Cancer Mother    Stroke Father     SOCIAL HISTORY Social History   Tobacco Use   Smoking status: Former    Types: Cigarettes    Quit date: 12/07/2000    Years since quitting: 20.9   Smokeless tobacco: Never  Substance Use Topics   Alcohol use: No   Drug use: No         OPHTHALMIC EXAM:  Base Eye Exam     Visual Acuity (ETDRS)       Right Left   Dist [place] 20/150 20/30 +2   Dist ph [place] NI          Tonometry (Tonopen, 9:46 AM)       Right Left   Pressure 14 17         Pupils        Pupils APD   Right PERRL None   Left PERRL None         Visual Fields       Left Right    Full Full         Extraocular Movement       Right Left    Full Full         Neuro/Psych     Oriented x3: Yes   Mood/Affect: Disoriented         Dilation     Both eyes: 1.0% Mydriacyl, 2.5% Phenylephrine @ 9:46 AM           Slit Lamp and Fundus Exam     External Exam       Right Left   External Normal Normal         Slit Lamp Exam       Right Left   Lids/Lashes Normal Normal   Conjunctiva/Sclera White and quiet White and quiet   Cornea Clear Clear   Anterior Chamber Deep and quiet Deep and quiet   Iris Round and reactive Round and reactive   Lens Posterior chamber intraocular lens Posterior chamber intraocular lens   Anterior Vitreous Normal Normal         Fundus Exam       Right Left   Posterior Vitreous Posterior vitreous detachment Posterior vitreous detachment   Disc Normal Normal   C/D Ratio 0.7 0.5   Macula Atrophy, Geographic atrophy, Disciform scar with dark red large neovascular trunk which is mature new blood supply to the fovea, Cystoid macular edema, Macular thickening, no exudates, Subretinal hemorrhage on edge of CNVM white scar Intermediate age related macular degeneration, no exudates, no hemorrhage, no macular thickening   Vessels Normal Normal   Periphery Normal Normal            IMAGING AND PROCEDURES  Imaging and Procedures for 11/04/21  OCT, Retina - OU - Both Eyes       Right Eye Quality was good. Scan locations included subfoveal. Central Foveal Thickness: 372. Progression has been stable. Findings include abnormal foveal contour, cystoid macular edema, disciform scar, pigment epithelial detachment.   Left Eye Quality was borderline. Scan locations included subfoveal. Central Foveal Thickness: 285. Progression has been stable. Findings include abnormal foveal contour, retinal drusen , pigment epithelial detachment.    Notes OD with chronic cystoid intraretinal fluid change overlying a vascularized pigment epithelial detachment. Currently stable 4 months interval of examination post Avastin will repeat injection today  OS no signs of active CNVM, incidental posterior vitreous detachment is visualized, baseline RPE to retinal evaluation slightly increased today  yet not reliable as compared to baseline      Intravitreal Injection, Pharmacologic Agent - OD - Right Eye       Time Out 11/04/2021. 10:06 AM. Confirmed correct patient, procedure, site, and patient consented.   Anesthesia Topical anesthesia was used. Anesthetic medications included Lidocaine 4%.   Procedure Preparation included 5% betadine to ocular surface, 10% betadine to eyelids, Tobramycin 0.3%. A 30 gauge needle was used.   Injection: 2.5 mg bevacizumab 2.5 MG/0.1ML   Route: Intravitreal, Site: Right Eye   NDC: [phone], Lot: [id number], Expiration date: 01/07/2022   Post-op Post injection exam found visual acuity of at least counting fingers. The patient tolerated the procedure well. There were no complications. The patient received written and verbal post procedure care education. Post injection medications included gentamicin.              ASSESSMENT/PLAN:  Intermediate stage nonexudative age-related macular degeneration of left eye Retains excellent acuity, with subfoveal drusenoid change  Exudative age-related macular degeneration of right eye with active choroidal neovascularization (HCC) Chronic active white disciform scar, with overlying CME controlled however still at extended interval of 4 months.  We will repeat evaluation next OD in 4 months  Posterior vitreous detachment of left eye Physiologic OS continue to observe  Posterior vitreous detachment of right eye OD physiologic continue to observe  Cystoid macular edema of right eye Component of the wet AMD underlying CNVM     ICD-10-CM   1. Exudative  age-related macular degeneration of right eye with active choroidal neovascularization (HCC)  H35.3211 OCT, Retina - OU - Both Eyes    Intravitreal Injection, Pharmacologic Agent - OD - Right Eye    bevacizumab (AVASTIN) SOSY 2.5 mg    2. Intermediate stage nonexudative age-related macular degeneration of left eye  H35.3122     3. Posterior vitreous detachment of left eye  H43.812     4. Posterior vitreous detachment of right eye  H43.811     5. Cystoid macular edema of right eye  H35.351       1.  OD with chronic active subfoveal CNVM which is now the blood supply to the outer retina with otherwise preserved good acuity.  Intraretinal CME has not extended and not worsened at extended interval 4 months.  Repeat injection Avastin OD today  2.  With the patient's overall mental status from CNS memory issues, we will continue to maintain evaluation at 49-monthinterval to decrease treatment burden.  3.  OS with excellent acuity continue observe  Ophthalmic Meds Ordered this visit:  Meds ordered this encounter  Medications   bevacizumab (AVASTIN) SOSY 2.5 mg       Return in about 4 months (around 03/06/2022) for DILATE OU, AVASTIN OCT, OD.  There are no Patient Instructions on file for this visit.   Explained the diagnoses, plan, and follow up with the patient and they expressed understanding.  Patient expressed understanding of the importance of proper follow up care.   GClent DemarkRankin M.D. Diseases & Surgery of the Retina and Vitreous Retina & Diabetic [place]06/29/23     Abbreviations: M myopia (nearsighted); A astigmatism; H hyperopia (farsighted); P presbyopia; Mrx spectacle prescription;  CTL contact lenses; OD right eye; OS left eye; OU both eyes  XT exotropia; ET esotropia; PEK punctate epithelial keratitis; PEE punctate epithelial erosions; DES dry eye syndrome; MGD meibomian gland dysfunction; ATs artificial tears; PFAT's preservative free artificial tears; [place] nuclear sclerotic cataract; PSC posterior subcapsular  cataract; ERM epi-retinal membrane; PVD posterior vitreous detachment; RD retinal detachment; DM diabetes mellitus; DR diabetic retinopathy; NPDR non-proliferative diabetic retinopathy; PDR proliferative diabetic retinopathy; CSME clinically significant macular edema; DME diabetic macular edema; dbh dot blot hemorrhages; CWS cotton wool spot; POAG primary open angle glaucoma; C/D cup-to-disc ratio; HVF humphrey visual field; GVF goldmann visual field; OCT optical coherence tomography; IOP intraocular pressure; BRVO Branch retinal vein occlusion; CRVO central retinal vein occlusion; CRAO central retinal artery occlusion; BRAO branch retinal artery occlusion; RT retinal tear; SB scleral buckle; PPV pars plana vitrectomy; VH Vitreous hemorrhage; PRP panretinal laser photocoagulation; IVK intravitreal kenalog; VMT vitreomacular traction; MH Macular hole;  NVD neovascularization of the disc; NVE neovascularization elsewhere; AREDS age related eye disease study; ARMD age related macular degeneration; POAG primary open angle glaucoma; EBMD epithelial/anterior basement membrane dystrophy; ACIOL anterior chamber intraocular lens; IOL intraocular lens; PCIOL posterior chamber intraocular lens; Phaco/IOL phacoemulsification with intraocular lens placement; [place] photorefractive keratectomy; LASIK laser assisted in situ keratomileusis; HTN hypertension; DM diabetes mellitus; COPD chronic obstructive pulmonary disease

## 2021-12-10 ENCOUNTER — Other Ambulatory Visit: Payer: Self-pay | Admitting: *Deleted

## 2021-12-10 MED ORDER — ROSUVASTATIN CALCIUM 10 MG PO TABS: ORAL_TABLET | ORAL | 1 refills | Status: DC

## 2022-01-24 DIAGNOSIS — E039 Hypothyroidism, unspecified: Secondary | ICD-10-CM | POA: Diagnosis not present

## 2022-01-25 ENCOUNTER — Ambulatory Visit (INDEPENDENT_AMBULATORY_CARE_PROVIDER_SITE_OTHER): Payer: Medicare Other | Admitting: Family Medicine

## 2022-01-25 VITALS — BP 130/85 | HR 66 | Temp 97.2°F | Ht 64.0 in | Wt 161.0 lb

## 2022-01-25 DIAGNOSIS — M545 Low back pain, unspecified: Secondary | ICD-10-CM | POA: Diagnosis not present

## 2022-01-25 DIAGNOSIS — E038 Other specified hypothyroidism: Secondary | ICD-10-CM

## 2022-01-25 DIAGNOSIS — F02B0 Dementia in other diseases classified elsewhere, moderate, without behavioral disturbance, psychotic disturbance, mood disturbance, and anxiety: Secondary | ICD-10-CM | POA: Diagnosis not present

## 2022-01-25 DIAGNOSIS — E782 Mixed hyperlipidemia: Secondary | ICD-10-CM | POA: Diagnosis not present

## 2022-01-25 DIAGNOSIS — I700 Atherosclerosis of aorta: Secondary | ICD-10-CM | POA: Diagnosis not present

## 2022-01-25 DIAGNOSIS — M25551 Pain in right hip: Secondary | ICD-10-CM | POA: Diagnosis not present

## 2022-01-25 DIAGNOSIS — Z23 Encounter for immunization: Secondary | ICD-10-CM | POA: Diagnosis not present

## 2022-01-25 DIAGNOSIS — G308 Other Alzheimer's disease: Secondary | ICD-10-CM | POA: Diagnosis not present

## 2022-01-25 LAB — TSH: TSH: 0.684 u[IU]/mL (ref 0.450–4.500)

## 2022-01-25 LAB — T4, FREE: Free T4: 1.65 ng/dL (ref 0.82–1.77)

## 2022-01-25 NOTE — Progress Notes (Signed)
   Subjective:    Patient ID: Susan Mcneil, female    DOB: [birth year], 76 y.o.   MRN: [id number]  HPI 5 month follow up   Right side low back pain several weeks  Low back pain discomfort present over the past several weeks hurts with certain movements rotation flexing bending denies any radiation down the leg does not know of any injury  Alzheimer's is stable but very debilitated and what she can do.  Unable to do any type of regular physical activity he often wants to sit or lay in bed does not eat when food is put in front of her but does not fix food eats typically twice per day sleeps okay gets stressed okay  Takes her cholesterol medicine does not have any problems with the medicine denies any side effects  Takes her thyroid medicine regular basis recent labs done and reviewed  Review of Systems     Objective:   Physical Exam  General-in no acute distress Eyes-no discharge Lungs-respiratory rate normal, CTA CV-no murmurs,RRR Extremities skin warm dry no edema Neuro grossly normal Behavior normal, alert       Assessment & Plan:  1. Lumbar pain Patient with onset of low back pain over the past 2 to 3 weeks Appears to be more musculoskeletal Will do x-rays Have discussed that if she does not improve with home exercises that the husband let us know and we can consult home health with home physical therapy - DG Hip Unilat W OR W/O Pelvis 2-3 Views Right - DG Lumbar Spine 2-3 Views  2. Right hip pain Please see discussion above May use Tylenol - DG Hip Unilat W OR W/O Pelvis 2-3 Views Right - DG Lumbar Spine 2-3 Views  3. Immunization due Flu shot today - Flu Vaccine QUAD High Dose(Fluad)  4. Moderate Alzheimer's dementia of other onset without behavioral disturbance, psychotic disturbance, mood disturbance, or anxiety ([place]) Does have some incontinence of bowel movements does fairly well with urination we did discuss trying to schedule a daily set time to lessen the  trouble of incontinence issues Patient is still able to get herself dressed able to feed herself unable to fix food Is able to answer questions and respond to husband appropriately but unable to remember unable to carry on a detailed discussion  5. Thoracic aorta atherosclerosis ([place]) On statin continue this  6. Other specified hypothyroidism On thyroid medicine continue this recent lab work looks good  7. Mixed hyperlipidemia On statin  Follow-up office visit in approximately 6 months sooner if any problems

## 2022-01-26 ENCOUNTER — Ambulatory Visit (HOSPITAL_COMMUNITY)
Admission: RE | Admit: 2022-01-26 | Discharge: 2022-01-26 | Disposition: A | Discharge status: Home / Self Care | Payer: Medicare Other | Source: Ambulatory Visit | Attending: Family Medicine | Admitting: Family Medicine

## 2022-01-26 DIAGNOSIS — M25551 Pain in right hip: Secondary | ICD-10-CM | POA: Diagnosis not present

## 2022-01-26 DIAGNOSIS — M545 Low back pain, unspecified: Secondary | ICD-10-CM | POA: Diagnosis not present

## 2022-02-22 ENCOUNTER — Other Ambulatory Visit: Payer: Self-pay | Admitting: Family Medicine

## 2022-03-01 ENCOUNTER — Telehealth: Payer: Self-pay | Admitting: Family Medicine

## 2022-03-01 NOTE — Telephone Encounter (Signed)
  Left message for patient to call back and schedule Medicare Annual Wellness Visit (AWV) in office.   If unable to come into the office for AWV,  please offer to do virtually or by telephone.  No hx of AWV eligible for AWVI per [place] as of 06/09/2013  Please schedule at anytime with [facility]-Nurse Health Advisor.      45 minute appointment   Any questions, please call me at [phone]

## 2022-03-02 NOTE — Telephone Encounter (Signed)
Left message for patient to call back and schedule Medicare Annual Wellness Visit (AWV) in office.    If unable to come into the office for AWV,  please offer to do virtually or by telephone.   No hx of AWV eligible for AWVI per [place] as of 06/09/2013   Please schedule at anytime with [facility]-Nurse Health Advisor.       45 minute appointment    Any questions, please call me at [phone]

## 2022-03-07 ENCOUNTER — Encounter (INDEPENDENT_AMBULATORY_CARE_PROVIDER_SITE_OTHER): Payer: Medicare Other | Admitting: Ophthalmology

## 2022-03-23 ENCOUNTER — Other Ambulatory Visit: Payer: Self-pay | Admitting: Family Medicine

## 2022-04-04 ENCOUNTER — Telehealth: Payer: Self-pay | Admitting: Family Medicine

## 2022-04-04 NOTE — Telephone Encounter (Signed)
Refill of 100 tablets sent to [facility] in October. Left detailed message (per DPR) to inform pt.

## 2022-04-04 NOTE — Telephone Encounter (Signed)
Patient is requesting refill to be sent to [facility]  Mail order rosuvastatin 10 mg

## 2022-04-20 ENCOUNTER — Telehealth: Payer: Self-pay | Admitting: Family Medicine

## 2022-04-20 DIAGNOSIS — F02B0 Dementia in other diseases classified elsewhere, moderate, without behavioral disturbance, psychotic disturbance, mood disturbance, and anxiety: Secondary | ICD-10-CM

## 2022-04-20 NOTE — Telephone Encounter (Signed)
She is a patient of our practice Husband (Otis)takes care of her He relates that he has a difficult time meeting her needs but he is doing the best he can. Please consult [facility] social worker to see if there is anything they may be able to do for her in regards to community assistance or having a become out to the house

## 2022-04-21 NOTE — Telephone Encounter (Signed)
Referral ordered in EPIC. 

## 2022-04-25 ENCOUNTER — Telehealth: Payer: Self-pay | Admitting: *Deleted

## 2022-04-25 NOTE — Progress Notes (Signed)
  Care Coordination   Note   04/25/2022 Name: Susan Mcneil MRN: [id number] DOB: Jul 09, 1945  Susan Mcneil is a 76 y.o. year old female who sees Luking, Elayne Snare, MD for primary care. I reached out to Beecher Mcardle by phone today to offer care coordination services.  [name] was given information about Care Coordination services today including:   The Care Coordination services include support from the care team which includes your Nurse Coordinator, Clinical Social Worker, or Pharmacist.  The Care Coordination team is here to help remove barriers to the health concerns and goals most important to you. Care Coordination services are voluntary, and the patient may decline or stop services at any time by request to their care team member.   Care Coordination Consent Status: Patient husband Susan Mcneil agreed to services and verbal consent obtained.   Follow up plan:  Telephone appointment with care coordination team member scheduled for:  05/03/22  Encounter Outcome:  Pt. Scheduled  [place]  Direct Dial: [phone]

## 2022-05-03 ENCOUNTER — Ambulatory Visit: Payer: Self-pay | Admitting: *Deleted

## 2022-05-03 ENCOUNTER — Encounter: Payer: Self-pay | Admitting: *Deleted

## 2022-05-03 NOTE — Patient Outreach (Signed)
Care Coordination   Initial Visit Note   05/03/2022  Name: Susan Mcneil MRN: [id number] DOB: 03/17/1946  Susan Mcneil is a 76 y.o. year old female who sees Luking, Elayne Snare, MD for primary care. I spoke with patient's husband, Susan Mcneil by phone today.  What matters to the patients health and wellness today?  Assist with Applying for Medicaid and [place].    Goals Addressed             This Visit's Progress    Assist with Applying for Medicaid and [place].   On track    Care Coordination Interventions:   Assessed Social Determinant of Health Barriers. Discussed Plans for Ongoing Care Management Follow Up. Provided Tree surgeon Information for Care Management Team. Screened for Signs & Symptoms of Depression Related to Chronic Disease State.  MWU1/LKG4 Depression Screen Completed & Results Reviewed.  Suicidal Ideation/Homicidal Ideation Assessed - None Present.   Solution-Focused Strategies Developed. Active Listening/Reflection Utilized.  Emotional & Caregiver Support Provided. Verbalization of Feelings Encouraged.  Caregiver Stress Acknowledged. Caregiver Resources Provided. Caregiver Support Groups Offered & Self-Enrollment Encouraged. Provided Crisis Support Information, Agencies and Resources. Problem Solving Interventions Identified. Task-Centered Solutions Implemented.   Psychoeducation for Mental Health Concerns Initiated. Reviewed Prescription Medications & Discussed Importance of Compliance. Quality of Sleep Assessed & Sleep Hygiene Techniques Promoted. Discussed Higher Level of Care Options ([place], [place]) & Consideration Encouraged. Verified No In-Home Care Services, [employer], Building control surveyor, Etc., Covered Under Teaching laboratory technician.   Reviewed Designer, television/film set through [employer] with Patient's  Husband, to Ensure Understanding.   Verified Patient/Spouse Combined Monthly Social Security Income Exceeds Poverty Guidelines for the [place], Previously Making Patient Ineligible for [employer]. Encouraged Husband to Assist Patient with Completion of Medicaid Application, Under New Medicaid Expansion Program, Effective 04/08/2022. Verified No Long-Term Care Insurance Benefits, [employer], Plans, Etc.  Confirmed Patient, Nor Patient's Husband, Were Veterans, Making Patient Ineligible for Aid & Attendance Benefits, Through [employer]. Please Review the Following List of Instructions and Applications, Mailed on 05/03/2022:      ~ 2023 Medicaid Tips ~ Apply for Medicaid On-Line ~ Medicaid Application ~ Personal Care Services Instructions ~ [place] Application ~ [place] Providers Please Contact CSW Directly (# [phone]), If You Have Questions or Need Assistance with Application Completion & Submission.        SDOH assessments and interventions completed:  Yes.  SDOH Interventions Today    Flowsheet Row Most Recent Value  SDOH Interventions   Food Insecurity Interventions Intervention Not Indicated  [Verified by Husband - [name] Intervention Not Indicated  [Verified by Husband - [name]]  Transportation Interventions Intervention Not Indicated, Patient Resources (Friends/Family), Payor Benefit  [Verified by Husband - [name]]  Utilities Interventions Intervention Not Indicated  [Verified by Husband - [name]]  Alcohol Usage Interventions Intervention Not Indicated (Score <7)  [Verified by Husband - [name]]  Financial Strain Interventions Intervention Not Indicated  [Verified by Husband - [name]]  Physical Activity Interventions Patient Refused  [Verified by Husband - [name]]  Stress Interventions Intervention Not Indicated  [Verified by Husband - [name]]   Social Connections Interventions Intervention Not Indicated  [Verified by Husband - [name] Coordination Interventions:  Yes, provided.   Follow up plan: Follow up call scheduled  for 05/16/2022 at 9:00 am.  Encounter Outcome:  Pt. Visit Completed.   Nat Christen, BSW, MSW, LCSW  Licensed Education officer, environmental [facility]  Mailing [place] [address], [place], [place] [address] Physical Address-[address] [place], [place] [address] Toll Free Main # [phone] Fax # [phone] Cell # [phone] [email or web address]

## 2022-05-03 NOTE — Patient Instructions (Signed)
Visit Information  Thank you for taking time to visit with me today. Please don't hesitate to contact me if I can be of assistance to you.   Following are the goals we discussed today:   Goals Addressed             This Visit's Progress    Assist with Applying for Medicaid and [place].   On track    Care Coordination Interventions:   Assessed Social Determinant of Health Barriers. Discussed Plans for Ongoing Care Management Follow Up. Provided Tree surgeon Information for Care Management Team. Screened for Signs & Symptoms of Depression Related to Chronic Disease State.  CNO7/SJG2 Depression Screen Completed & Results Reviewed.  Suicidal Ideation/Homicidal Ideation Assessed - None Present.   Solution-Focused Strategies Developed. Active Listening/Reflection Utilized.  Emotional & Caregiver Support Provided. Verbalization of Feelings Encouraged.  Caregiver Stress Acknowledged. Caregiver Resources Provided. Caregiver Support Groups Offered & Self-Enrollment Encouraged. Provided Crisis Support Information, Agencies and Resources. Problem Solving Interventions Identified. Task-Centered Solutions Implemented.   Psychoeducation for Mental Health Concerns Initiated. Reviewed Prescription Medications & Discussed Importance of Compliance. Quality of Sleep Assessed & Sleep Hygiene Techniques Promoted. Discussed Higher Level of Care Options ([place], [place]) & Consideration Encouraged. Verified No In-Home Care Services, [employer], Building control surveyor, Etc., Covered Under Teaching laboratory technician.   Reviewed Designer, television/film set through [employer] with Patient's Husband, to Ensure Understanding.   Verified Patient/Spouse Combined Monthly Social Security Income Exceeds Poverty Guidelines for the [place], Previously Making Patient Ineligible for  [employer]. Encouraged Husband to Assist Patient with Completion of Medicaid Application, Under New Medicaid Expansion Program, Effective 04/08/2022. Verified No Long-Term Care Insurance Benefits, [employer], Plans, Etc.  Confirmed Patient, Nor Patient's Husband, Were Veterans, Making Patient Ineligible for Aid & Attendance Benefits, Through [employer]. Please Review the Following List of Instructions and Applications, Mailed on 05/03/2022:      ~ 2023 Medicaid Tips ~ Apply for Medicaid On-Line ~ Medicaid Application ~ Personal Care Services Instructions ~ [place] Application ~ [place] Providers Please Contact CSW Directly (# [phone]), If You Have Questions or Need Assistance with Application Completion & Submission.      Our next appointment is by telephone on 05/16/2022 at 9:00 am.  Please call the care guide team at [phone] if you need to cancel or reschedule your appointment.   If you are experiencing a Mental Health or [place] or need someone to talk to, please call the Suicide and Crisis Lifeline: 988 call the [place] National Suicide Prevention Lifeline: 502-768-7806 or TTY: 5011338202 TTY 347-138-8938) to talk to a trained counselor call 1-800-273-TALK (toll free, 24 hour hotline) go to [facility] [address] [place] [phone]) call the [place]. [place]: [phone] call 911  Patient verbalizes understanding of instructions and care plan provided today and agrees to view in [place]. Active MyChart status and patient understanding of how to access instructions and care plan via MyChart confirmed with patient.     Telephone follow up appointment with care management team member scheduled for:  05/16/2022 at 9:00 am.  Nat Christen, BSW, MSW, [place]  Licensed Clinical Social Worker  [place]  Mailing [place]. [address] [place], [place] [address] Physical Address-[address], [place], [place] [address] Toll Free Main # [phone] Fax # [phone] Cell #  [email or web address]

## 2022-05-16 ENCOUNTER — Ambulatory Visit: Payer: Self-pay | Admitting: *Deleted

## 2022-05-16 NOTE — Patient Outreach (Signed)
  Care Coordination   05/16/2022  Name: Susan Mcneil MRN: [id number] DOB: 07/01/1945   Care Coordination Outreach Attempts:  An unsuccessful telephone outreach was attempted today to offer the patient information about available care coordination services as a benefit of their health plan. HIPAA compliant messages left on voicemail, providing contact information for CSW, encouraging patient and/or husband, Susan Mcneil to return CSW's call at their earliest convenience.  Follow Up Plan:  Additional outreach attempts will be made to offer the patient care coordination information and services.   Encounter Outcome:  No Answer.   Care Coordination Interventions:  No, not indicated.    Nat Christen, BSW, MSW, LCSW  Licensed Education officer, environmental Health System  Mailing [place] [address]. [address] [place], [place] [address] Physical Address-[address] [place], [place] [address] Toll Free Main # [phone] Fax # [phone] Cell # [phone] [email or web address]

## 2022-05-31 ENCOUNTER — Ambulatory Visit: Payer: Self-pay | Admitting: *Deleted

## 2022-05-31 ENCOUNTER — Encounter: Payer: Self-pay | Admitting: *Deleted

## 2022-05-31 NOTE — Patient Instructions (Signed)
Visit Information  Thank you for taking time to visit with me today. Please don't hesitate to contact me if I can be of assistance to you.   Following are the goals we discussed today:   Goals Addressed             This Visit's Progress    Assist with Applying for Medicaid and [place].   On track    Care Coordination Interventions:   Active Listening & Reflection Utilized. Verbalization of Feelings Encouraged.   Emotional & Caregiver Support Provided. Caregiver Stress Acknowledged. Caregiver Resources Reviewed. Caregiver Support Groups Offered & Self-Enrollment Encouraged. Problem Solving Interventions Activated Task-Centered Solutions Employed.   Solution-Focused Strategies Implemented. CSW Collaboration with Husband, Kendelle Schweers to Confirm Receipt & Thoroughly Review The Following List of Instructions & Applications, to Ensure Understanding:      ~ 2023 Medicaid Tips ~ Apply for Medicaid On-Line ~ Medicaid Application ~ [place] Instructions ~ [place] Application ~ [place] Providers CSW Collaboration with Husband, Pietrina Jagodzinski to Confirm His Desire to Not Complete Application for Medicaid, Even After CSW Offered to Assist with Application Completion & Submission, to The [place] [phone]), for Processing. CSW Collaboration with Husband, Nyrah Demos to Explain Patient's Inability to Apply for [place], through [employer] [phone]), Without Medicaid Approval. CSW Collaboration with Husband, Lashica Hannay to Review [place] & Encouraged Consideration.  Please Contact CSW Directly (# [phone]), If You Have Questions, Change Your Mind About Wanting Assistance with Completion & Submission of Medicaid Application, or If Additional Social Work Needs Are Identified Between Now & Our Next Scheduled Telephone  Ver[employer].      Our next appointment is by telephone on 06/17/2022 at 1:45 pm.  Please call the care guide team at [phone] if you need to cancel or reschedule your appointment.   If you are experiencing a Mental Health or [place] or need someone to talk to, please call the Suicide and Crisis Lifeline: 988 call the [place] National Suicide Prevention Lifeline: 402 018 4691 or TTY: 870-608-3001 TTY 204-745-2255) to talk to a trained counselor call 1-800-273-TALK (toll free, 24 hour hotline) go to [facility] [address] [place] [phone]) call the [place]: [phone] call 911  Patient verbalizes understanding of instructions and care plan provided today and agrees to view in [place]. Active MyChart status and patient understanding of how to access instructions and care plan via MyChart confirmed with patient.     Telephone follow up appointment with care management team member scheduled for:  06/17/2022 at 1:45 pm.   Nat Christen, BSW, MSW, [place]  Licensed Clinical Social Worker  [place]  Mailing [place]. [address], [place], [place] [address] Physical Address-[address] [place], [place] [address] Toll Free Main # [phone] Fax # [phone] Cell # [phone] [email or web address]

## 2022-05-31 NOTE — Patient Outreach (Signed)
  Care Coordination   Follow Up Visit Note   05/31/2022  Name: Susan Mcneil MRN: [id number] DOB: [birth year]  Susan Mcneil is a 77 y.o. year old female who sees Luking, Elayne Snare, MD for primary care. I spoke with patient's husband, Calyse Murcia by phone today.  What matters to the patients health and wellness today?   Assist with Applying for Medicaid and [place].   Goals Addressed             This Visit's Progress    Assist with Applying for Medicaid and [place].   On track    Care Coordination Interventions:   Active Listening & Reflection Utilized. Verbalization of Feelings Encouraged.   Emotional & Caregiver Support Provided. Caregiver Stress Acknowledged. Caregiver Resources Reviewed. Caregiver Support Groups Offered & Self-Enrollment Encouraged. Problem Solving Interventions Activated Task-Centered Solutions Employed.   Solution-Focused Strategies Implemented. CSW Collaboration with Husband, Ahjanae Cassel to Confirm Receipt & Thoroughly Review The Following List of Instructions & Applications, to Ensure Understanding:              ~ 2023 Medicaid Tips ~ Apply for Medicaid On-Line ~ Medicaid Application ~ [place] Instructions ~ [place] Application ~ [place] Providers CSW Collaboration with Husband, Roseanne Juenger to Confirm His Desire to Not Complete Application for Medicaid, Even After CSW Offered to Assist with Application Completion & Submission, to The [place] [phone]), for Processing. CSW Collaboration with Husband, Stefani Baik to Explain Patient's Inability to Apply for [place], through [employer] [phone]), Without Medicaid Approval. CSW Collaboration with Husband, Alonah Lineback to Review [place] & Encouraged Consideration.  Please Contact CSW Directly (# [phone]), If  You Have Questions, Change Your Mind About Wanting Assistance with Completion & Submission of Medicaid Application, or If Additional Social Work Needs Are Identified Between Now & Our Next Scheduled Telephone [employer].      SDOH assessments and interventions completed:  Yes.  Care Coordination Interventions:  Yes, provided.   Follow up plan: Follow up call scheduled for 06/17/2022 at 1:45 pm.  Encounter Outcome:  Pt. Visit Completed.  Nat Christen, BSW, MSW, LCSW  Licensed Education officer, environmental Health System  Mailing [place] [address], [place], [place] [address] Physical Address-[address], [place],  [address] Toll Free Main # [phone] Fax # [phone] Cell # [phone] [email or web address]

## 2022-06-07 ENCOUNTER — Telehealth: Payer: Self-pay | Admitting: Family Medicine

## 2022-06-07 ENCOUNTER — Other Ambulatory Visit: Payer: Self-pay | Admitting: Family Medicine

## 2022-06-07 DIAGNOSIS — F0280 Dementia in other diseases classified elsewhere without behavioral disturbance: Secondary | ICD-10-CM

## 2022-06-07 DIAGNOSIS — R54 Age-related physical debility: Secondary | ICD-10-CM

## 2022-06-07 DIAGNOSIS — R5383 Other fatigue: Secondary | ICD-10-CM

## 2022-06-07 NOTE — Telephone Encounter (Signed)
Lab orders placed and appt moved up to 06/14/22 at 11:20 am

## 2022-06-07 NOTE — Telephone Encounter (Signed)
Patient 's husband is requesting labs and her appointment for March be moved up due to he can't get her to do anything not even take a bath. Please advise

## 2022-06-07 NOTE — Telephone Encounter (Signed)
CBC, CMP, TSH, free T4 Fatigue, frailty, Alzheimer's May move her appointment into February next available thanks

## 2022-06-07 NOTE — Telephone Encounter (Signed)
Last labs completed 01/24/22 Free T4 and TSH. Please advise. Thank you

## 2022-06-08 DIAGNOSIS — E039 Hypothyroidism, unspecified: Secondary | ICD-10-CM | POA: Diagnosis not present

## 2022-06-08 DIAGNOSIS — R54 Age-related physical debility: Secondary | ICD-10-CM | POA: Diagnosis not present

## 2022-06-08 DIAGNOSIS — G309 Alzheimer's disease, unspecified: Secondary | ICD-10-CM | POA: Diagnosis not present

## 2022-06-08 DIAGNOSIS — F0280 Dementia in other diseases classified elsewhere without behavioral disturbance: Secondary | ICD-10-CM | POA: Diagnosis not present

## 2022-06-08 DIAGNOSIS — R5383 Other fatigue: Secondary | ICD-10-CM | POA: Diagnosis not present

## 2022-06-09 LAB — COMPREHENSIVE METABOLIC PANEL
ALT: 7 IU/L (ref 0–32)
AST: 20 IU/L (ref 0–40)
Albumin/Globulin Ratio: 1.8 (ref 1.2–2.2)
Albumin: 4.4 g/dL (ref 3.8–4.8)
Alkaline Phosphatase: 83 IU/L (ref 44–121)
BUN/Creatinine Ratio: 24 (ref 12–28)
BUN: 18 mg/dL (ref 8–27)
Bilirubin Total: 0.5 mg/dL (ref 0.0–1.2)
CO2: 25 mmol/L (ref 20–29)
Calcium: 9.6 mg/dL (ref 8.7–10.3)
Chloride: 102 mmol/L (ref 96–106)
Creatinine, Ser: 0.76 mg/dL (ref 0.57–1.00)
Globulin, Total: 2.5 g/dL (ref 1.5–4.5)
Glucose: 103 mg/dL — ABNORMAL HIGH (ref 70–99)
Potassium: 3.6 mmol/L (ref 3.5–5.2)
Sodium: 144 mmol/L (ref 134–144)
Total Protein: 6.9 g/dL (ref 6.0–8.5)
eGFR: 81 mL/min/{1.73_m2} (ref >59)

## 2022-06-09 LAB — CBC WITH DIFFERENTIAL/PLATELET
Basophils Absolute: 0 10*3/uL (ref 0.0–0.2)
Basos: 0 %
EOS (ABSOLUTE): 0.1 10*3/uL (ref 0.0–0.4)
Eos: 2 %
Hematocrit: 48 % — ABNORMAL HIGH (ref 34.0–46.6)
Hemoglobin: 15.5 g/dL (ref 11.1–15.9)
Immature Grans (Abs): 0 10*3/uL (ref 0.0–0.1)
Immature Granulocytes: 0 %
Lymphocytes Absolute: 2.1 10*3/uL (ref 0.7–3.1)
Lymphs: 31 %
MCH: 28.9 pg (ref 26.6–33.0)
MCHC: 32.3 g/dL (ref 31.5–35.7)
MCV: 90 fL (ref 79–97)
Monocytes Absolute: 0.4 10*3/uL (ref 0.1–0.9)
Monocytes: 6 %
Neutrophils Absolute: 4.1 10*3/uL (ref 1.4–7.0)
Neutrophils: 61 %
Platelets: 247 10*3/uL (ref 150–450)
RBC: 5.36 x10E6/uL — ABNORMAL HIGH (ref 3.77–5.28)
RDW: 13.1 % (ref 11.7–15.4)
WBC: 6.7 10*3/uL (ref 3.4–10.8)

## 2022-06-09 LAB — T4, FREE: Free T4: 1.7 ng/dL (ref 0.82–1.77)

## 2022-06-09 LAB — TSH: TSH: 1.34 u[IU]/mL (ref 0.450–4.500)

## 2022-06-14 ENCOUNTER — Ambulatory Visit: Payer: Medicare PPO | Admitting: Family Medicine

## 2022-06-14 VITALS — BP 110/76 | Wt 156.6 lb

## 2022-06-14 DIAGNOSIS — F02C0 Dementia in other diseases classified elsewhere, severe, without behavioral disturbance, psychotic disturbance, mood disturbance, and anxiety: Secondary | ICD-10-CM | POA: Diagnosis not present

## 2022-06-14 DIAGNOSIS — E038 Other specified hypothyroidism: Secondary | ICD-10-CM

## 2022-06-14 DIAGNOSIS — H353211 Exudative age-related macular degeneration, right eye, with active choroidal neovascularization: Secondary | ICD-10-CM | POA: Diagnosis not present

## 2022-06-14 DIAGNOSIS — I700 Atherosclerosis of aorta: Secondary | ICD-10-CM

## 2022-06-14 DIAGNOSIS — G308 Other Alzheimer's disease: Secondary | ICD-10-CM

## 2022-06-14 DIAGNOSIS — F0280 Dementia in other diseases classified elsewhere without behavioral disturbance: Secondary | ICD-10-CM

## 2022-06-14 MED ORDER — MEMANTINE HCL 10 MG PO TABS: 10.0000 mg | ORAL_TABLET | Freq: Two times a day (BID) | ORAL | 2 refills | Status: DC

## 2022-06-14 NOTE — Progress Notes (Signed)
   Subjective:    Patient ID: Susan Mcneil, female    DOB: 1945/12/02, 77 y.o.   MRN: [id number]  HPI Patient arrives today 6 mouth visit spouse seems to think patient is getting worse. He states that the patient does not want to bathe or get dressed.  Patient has severe Alzheimer's Getting progressively worse Now she is at the point of putting all multiple layers of clothes even though she already has what she needs on Occasional has incontinence but not frequent Seems to eat okay but cannot prepped her own food Refuses statin take baths minutes of fall hazard when she takes a shower she will not let her husband help clean her   Review of Systems     Objective:   Physical Exam  General-in no acute distress Eyes-no discharge Lungs-respiratory rate normal, CTA CV-no murmurs,RRR Extremities skin warm dry no edema Neuro grossly normal Behavior normal, alert       Assessment & Plan:   Alzheimer's Needs extra help We will go ahead and consult for palliative care/serious health issue care Labs reviewed Follow-up in 3 months The patient and her husband has requested to speak with their daughter to discuss the issue in greater detail Thoracic aorta atherosclerosis on statin currently as her condition progresses we may be cutting back on some of these dosings/medicines May continue medications for now Will discuss advance care directive on next visit  Thyroid stable continue current measures

## 2022-06-17 ENCOUNTER — Ambulatory Visit: Payer: Self-pay | Admitting: *Deleted

## 2022-06-19 ENCOUNTER — Encounter: Payer: Self-pay | Admitting: *Deleted

## 2022-06-19 NOTE — Patient Outreach (Signed)
Care Coordination   Follow Up Visit Note   06/17/2022 - Late Entry  Name: Susan Mcneil MRN: [id number] DOB: May 21, 1945  Susan Mcneil is a 77 y.o. year old female who sees Luking, Elayne Snare, MD for primary care. I spoke with husband, Myalynn Hasbrook by phone today.  What matters to the patients health and wellness today?   Assist with Applying for Medicaid and [place].   Goals Addressed             This Visit's Progress    Assist with Applying for Medicaid and [place].   On track    Care Coordination Interventions:  Interventions Today    Flowsheet Row Most Recent Value  Chronic Disease Discussed/Reviewed   Chronic disease discussed/reviewed during today's visit Other  [Alzheimer's/Dementia]  General Interventions   General Interventions Discussed/Reviewed General Interventions Discussed, General Interventions Reviewed, Durable Medical Equipment (DME), Health Screening, Community Resources, Doctor Visits  Doctor Visits Discussed/Reviewed Doctor Visits Discussed, Doctor Visits Reviewed, Annual Wellness Visits  Durable Medical Equipment (DME) Bed side commode, BP Cuff, Environmental consultant, Wheelchair, Estate agent  Exercise Interventions   Exercise Discussed/Reviewed Exercise Discussed, Exercise Reviewed, Assistive device use and maintanence  Education Interventions   Education Provided Provided Orthoptist, Provided Engineer, site  [In-Home [place] Providers]  Provided Verbal Education On Nutrition, Mental Health/Coping with Illness, Exercise, Medication, When to see the doctor  [place] Discussed, Mental Health Reviewed, Coping Strategies  Nutrition Interventions   Nutrition Discussed/Reviewed Nutrition Discussed, Nutrition Reviewed, Supplmental nutrition  Pharmacy Interventions   Pharmacy  Dicussed/Reviewed Medication Adherence  Safety Interventions   Safety Discussed/Reviewed Safety Discussed, Safety Reviewed, Fall Risk, Home Safety  Home Safety Assistive Devices, Need for home safety assessment, Refer for community resources      Active Listening & Reflection Utilized. Verbalization of Feelings Encouraged.   Emotional & Caregiver Support Provided. Caregiver Stress Acknowledged. Caregiver Resources Discussed. Caregiver Support Groups Reviewed. Self-Enrollment in Caregiver Support Group of Interest Encouraged, From List Provided. Problem Solving Interventions Revisited. Task-Centered [employer].   Solution-Focused Strategies Employed. Client-Centered Therapy Initiated. Acceptance & Commitment Therapy Introduced. Brief Cognitive Behavioral Therapy Performed. CSW Collaboration with Husband, Marilea Kaelin to [employer] for [employer], through The [place] [phone]). CSW Collaboration with Husband, Vondra Pritt to Confirm His Knowledge of Patient's Ineligibility to Apply/Qualify for [place], through [facility] [phone]), Without Medicaid Benefits, through The [place] [phone]). CSW Collaboration with Husband, Kija Sheldon to [employer] Receipt of The Following List of [employer] , [employer], Via Email ([email or web address]) on 06/17/2022 & Thoroughly Reviewed, to Ensure Understanding: ~ [place] ~ [place]  ~ [place] Providers ~ [place] with Husband, Brittnae Grewell to [employer] with [employer] , Services & Resources of Interest, from The List Provided, to Inquire About Hourly Rates, Availability, Services Provided, Etc. CSW Collaboration with Husband, Truc [place] to [employer] with CSW [phone]#  [phone]), If He Has Questions, Needs Assistance with [place], or If Additional Social Work Needs Are Identified Between Now & Our Next Scheduled Telephone [employer].      SDOH assessments and interventions completed:  Yes.  Care Coordination Interventions:  Yes,  provided.   Follow up plan: Follow up call scheduled for 07/01/2022 at 9:00 am.  Encounter Outcome:  Pt. Visit Completed.   Nat Christen, BSW, MSW, LCSW  Licensed Education officer, environmental Health System  Mailing [place] [address], [place], [place] [address] Physical Address[address] [place], [place] [address] Toll Free Main # [phone] Fax # [phone] Cell # [phone] [email or web address]

## 2022-06-19 NOTE — Patient Instructions (Signed)
Visit Information  Thank you for taking time to visit with me today. Please don't hesitate to contact me if I can be of assistance to you.   Following are the goals we discussed today:   Goals Addressed             This Visit's Progress    Assist with Applying for Medicaid and [place].   On track    Care Coordination Interventions:  Interventions Today    Flowsheet Row Most Recent Value  Chronic Disease Discussed/Reviewed   Chronic disease discussed/reviewed during today's visit Other  [Alzheimer's/Dementia]  General Interventions   General Interventions Discussed/Reviewed General Interventions Discussed, General Interventions Reviewed, Durable Medical Equipment (DME), Health Screening, Community Resources, Doctor Visits  Doctor Visits Discussed/Reviewed Doctor Visits Discussed, Doctor Visits Reviewed, Annual Wellness Visits  Durable Medical Equipment (DME) Bed side commode, BP Cuff, Environmental consultant, Wheelchair, Estate agent  Exercise Interventions   Exercise Discussed/Reviewed Exercise Discussed, Exercise Reviewed, Assistive device use and maintanence  Education Interventions   Education Provided Provided Orthoptist, Provided Engineer, site  [In-Home [place] Providers]  Provided Verbal Education On Nutrition, Mental Health/Coping with Illness, Exercise, Medication, When to see the doctor  [place] Discussed, Mental Health Reviewed, Coping Strategies  Nutrition Interventions   Nutrition Discussed/Reviewed Nutrition Discussed, Nutrition Reviewed, Supplmental nutrition  Pharmacy Interventions   Pharmacy Dicussed/Reviewed Medication Adherence  Safety Interventions   Safety Discussed/Reviewed Safety Discussed, Safety Reviewed, Fall Risk, Home Safety  Home Safety Assistive Devices, Need for home safety assessment,  Refer for community resources      Active Listening & Reflection Utilized. Verbalization of Feelings Encouraged.   Emotional & Caregiver Support Provided. Caregiver Stress Acknowledged. Caregiver Resources Discussed. Caregiver Support Groups Reviewed. Self-Enrollment in Caregiver Support Group of Interest Encouraged, From List Provided. Problem Solving Interventions Revisited. Task-Centered [employer].   Solution-Focused Strategies Employed. Client-Centered Therapy Initiated. Acceptance & Commitment Therapy Introduced. Brief Cognitive Behavioral Therapy Performed. CSW Collaboration with Husband, Allysen Bail to [employer] for [employer], through The [place] [phone]). CSW Collaboration with Husband, Oneisha Ozier to Confirm His Knowledge of Patient's Ineligibility to Apply/Qualify for [place], through [facility] [phone]), Without Medicaid Benefits, through The [place] [phone]). CSW Collaboration with Husband, Meekah Furlough to [employer] Receipt of The Following List of [employer] , [employer], Via Email ([email or web address]) on 06/17/2022 & Thoroughly Reviewed, to Ensure Understanding: ~ [place] ~ [place]  ~ [place] Providers ~ [place] with Husband, Rosemond Marsh to [employer] with [employer] , Services & Resources of Interest, from The List Provided, to Inquire About Hourly Rates, Availability, Services Provided, Etc. CSW Collaboration with Husband, Lealer Rabon to [employer] with CSW [phone]# [phone]), If He Has Questions, Needs Assistance with [place], or If Additional Social Work Needs Are Identified Between Now & Our Next Scheduled Telephone [employer].      Our next appointment is by telephone on 07/01/2022 at 9:00 am.  Please call the care guide team at [phone] if you need to cancel or reschedule your appointment.   If you are experiencing a Mental Health or [place] or need someone to talk to, please call  the Suicide and Crisis Lifeline: 988 call the [place] National Suicide Prevention Lifeline: 754 067 4787 or TTY: (915)754-7343 TTY 904-649-6184) to talk to a trained counselor call 1-800-273-TALK (toll free, 24 hour hotline) go to [facility] [address] [place] [phone]) call the [place]: [phone] call 911  Patient verbalizes understanding of instructions and care plan provided today and agrees to view in [place]. Active MyChart status and patient understanding of how to access instructions and care plan via MyChart confirmed with patient.     Telephone follow up appointment with care management team member scheduled for:  07/01/2022 at 9:00 am.  Nat Christen, BSW, MSW, [place]  Licensed Clinical Social Worker  [place]  Mailing [place]. [address], [place], [place] [address] Physical Address-[address], [place], [place] [address] Toll Free Main # [phone] Fax # [phone] Cell # [phone] [email or web address]

## 2022-07-01 ENCOUNTER — Encounter: Payer: Self-pay | Admitting: *Deleted

## 2022-07-01 ENCOUNTER — Ambulatory Visit: Payer: Self-pay | Admitting: *Deleted

## 2022-07-01 NOTE — Patient Outreach (Signed)
  Care Coordination   Follow Up Visit Note   07/01/2022  Name: Susan Mcneil MRN: [id number] DOB: Apr 24, 1946  Susan Mcneil is a 77 y.o. year old female who sees Luking, Elayne Snare, MD for primary care. I spoke with husband, Susan Mcneil by phone today.  What matters to the patients health and wellness today?   Assist with Applying for Medicaid and [place].   Goals Addressed             This Visit's Progress    COMPLETED: Assist with Applying for Medicaid and Personal Care Services.   On track    Care Coordination Interventions:  Interventions Today    Flowsheet Row Most Recent Value  Chronic Disease Discussed/Reviewed   Chronic disease discussed/reviewed during today's visit Other  [Alzheimer's/Dementia]  General Interventions   General Interventions Discussed/Reviewed General Interventions Discussed, General Interventions Reviewed, Durable Medical Equipment (DME), Health Screening, Community Resources, Doctor Visits  Doctor Visits Discussed/Reviewed Doctor Visits Discussed, Doctor Visits Reviewed, Annual Wellness Visits  Durable Medical Equipment (DME) Bed side commode, BP Cuff, Environmental consultant, Wheelchair, Estate agent  Exercise Interventions   Exercise Discussed/Reviewed Exercise Discussed, Exercise Reviewed, Assistive device use and maintanence  Education Interventions   Education Provided Provided Orthoptist, Provided Engineer, site  [In-Home [place] [employer]]  Provided Verbal Education On Nutrition, Mental Health/Coping with Illness, Exercise, Medication, When to see the doctor  [place] Discussed, Mental Health Reviewed, Coping Strategies  Nutrition Interventions   Nutrition Discussed/Reviewed Nutrition Discussed, Nutrition Reviewed, Supplmental nutrition  Pharmacy Interventions   Pharmacy  Dicussed/Reviewed Medication Adherence  Safety Interventions   Safety Discussed/Reviewed Safety Discussed, Safety Reviewed, Fall Risk, Home Safety  Home Safety Assistive Devices, Need for home safety assessment, Refer for community resources      Active Listening & Reflection Utilized. Verbalization of Feelings Encouraged.   Emotional Support Provided. Feelings of Care Burnout Validated. Caregiver Stress Acknowledged. Caregiver Resources Discussed. Caregiver Support Groups Reviewed. Self-Enrollment in Caregiver Support Group of Interest Encouraged. Access to Weapons Assessed - None Present. Suicidal & Homicidal Ideations Assessed - None Present. Domestic Violence Assessed - None Present. Problem Solving Interventions Implemented. Task-Centered [employer].   Solution-Focused Strategies Employed. Client-Centered Therapy Initiated. Acceptance & Commitment Therapy Indicated. Cognitive Behavioral Therapy Performed. Crisis Information, Agencies, [place]. CSW Collaboration with Husband, Susan Mcneil to [employer] with [place], to Inquire About Hourly Rates & Availability of Services, In An Effort to [place]. CSW Collaboration with Husband, Susan Mcneil to [employer] with CSW [phone]# [phone]), If He Has Questions, Needs Assistance with [place], or If Additional Social Work Needs Are Identified in The Near Future.      SDOH assessments and interventions completed:  Yes.  Care Coordination Interventions:  Yes, provided.   Follow up plan: No further intervention required.   Encounter Outcome:  Pt. Visit Completed.   Nat Christen, BSW, MSW, LCSW  Licensed Education officer, environmental [facility] System  Mailing [place] [address], [place], [place] [address] Physical Address-[address] [place], [place] [address] Toll Free Main # [phone] Fax # [phone] Cell # [phone] [email or web address]

## 2022-07-01 NOTE — Patient Instructions (Signed)
Visit Information  Thank you for taking time to visit with me today. Please don't hesitate to contact me if I can be of assistance to you.   Following are the goals we discussed today:   Goals Addressed             This Visit's Progress    COMPLETED: Assist with Applying for Medicaid and [place].   On track    Care Coordination Interventions:  Interventions Today    Flowsheet Row Most Recent Value  Chronic Disease Discussed/Reviewed   Chronic disease discussed/reviewed during today's visit Other  [Alzheimer's/Dementia]  General Interventions   General Interventions Discussed/Reviewed General Interventions Discussed, General Interventions Reviewed, Durable Medical Equipment (DME), Health Screening, Community Resources, Doctor Visits  Doctor Visits Discussed/Reviewed Doctor Visits Discussed, Doctor Visits Reviewed, Annual Wellness Visits  Durable Medical Equipment (DME) Bed side commode, BP Cuff, Environmental consultant, Wheelchair, Estate agent  Exercise Interventions   Exercise Discussed/Reviewed Exercise Discussed, Exercise Reviewed, Assistive device use and maintanence  Education Interventions   Education Provided Provided Orthoptist, Provided Engineer, site  [In-Home [place] Prov[employer]]  Provided Verbal Education On Nutrition, Mental Health/Coping with Illness, Exercise, Medication, When to see the doctor  [place] Discussed, Mental Health Reviewed, Coping Strategies  Nutrition Interventions   Nutrition Discussed/Reviewed Nutrition Discussed, Nutrition Reviewed, Supplmental nutrition  Pharmacy Interventions   Pharmacy Dicussed/Reviewed Medication Adherence  Safety Interventions   Safety Discussed/Reviewed Safety Discussed, Safety Reviewed, Fall Risk, Home Safety  Home Safety Assistive Devices, Need for home safety  assessment, Refer for community resources      Active Listening & Reflection Utilized. Verbalization of Feelings Encouraged.   Emotional Support Provided. Feelings of Care Burnout Validated. Caregiver Stress Acknowledged. Caregiver Resources Discussed. Caregiver Support Groups Reviewed. Self-Enrollment in Caregiver Support Group of Interest Encouraged. Access to Weapons Assessed - None Present. Suicidal & Homicidal Ideations Assessed - None Present. Domestic Violence Assessed - None Present. Problem Solving Interventions Implemented. Task-Centered [employer].   Solution-Focused Strategies Employed. Client-Centered Therapy Initiated. Acceptance & Commitment Therapy Indicated. Cognitive Behavioral Therapy Performed. Crisis Information, Agencies, [place]. CSW Collaboration with Husband, Gazella Sterman to [employer] with [place], to Inquire About Hourly Rates & Availability of Services, In An Effort to [place]. CSW Collaboration with Husband, Christiana Bacigalupo to [employer] with CSW [phone]# [phone]), If He Has Questions, Needs Assistance with [place], or If Additional Social Work Needs Are Identified in The Near Future.      Please call the care guide team at [phone] if you need to cancel or reschedule your appointment.   If you are experiencing a Mental Health or [place] or need someone to talk to, please call the Suicide and Crisis Lifeline: 988 call the [place] National Suicide Prevention Lifeline: (325)018-5657 or TTY: 5313040428 TTY 9495360598) to talk to a trained counselor call 1-800-273-TALK (toll free, 24 hour hotline) go to [facility] [address], [place] [phone]) call the [place]: [phone] call 911  Patient verbalizes  understanding of instructions and care plan provided today and agrees to view in [place]. Active MyChart status and patient understanding of how to access instructions and care plan via MyChart confirmed with patient.     No further follow up required.  Nat Christen, BSW, MSW, LCSW  Licensed Education officer, environmental Health System  Mailing [place] [address] [place], [place] [address] Physical Address-[address], [place], [place] [address] Toll Free Main # [phone] Fax # [phone] Cell # [phone] [email or web address]

## 2022-07-26 ENCOUNTER — Telehealth: Payer: Self-pay | Admitting: Family Medicine

## 2022-07-26 ENCOUNTER — Ambulatory Visit: Payer: Medicare Other | Admitting: Family Medicine

## 2022-07-26 NOTE — Telephone Encounter (Signed)
Refill on  rosuvastatin (CRESTOR) 10 MG tablet  sent to [facility] home delivery

## 2022-07-27 MED ORDER — ROSUVASTATIN CALCIUM 10 MG PO TABS: ORAL_TABLET | ORAL | 1 refills | Status: DC

## 2022-08-10 ENCOUNTER — Other Ambulatory Visit: Payer: Self-pay

## 2022-08-10 ENCOUNTER — Telehealth: Payer: Self-pay

## 2022-08-10 MED ORDER — ROSUVASTATIN CALCIUM 10 MG PO TABS: ORAL_TABLET | ORAL | 2 refills | Status: DC

## 2022-08-10 MED ORDER — DONEPEZIL HCL 5 MG PO TABS: 5.0000 mg | ORAL_TABLET | Freq: Every day | ORAL | 2 refills | Status: DC

## 2022-08-10 NOTE — Telephone Encounter (Signed)
Prescription Request  08/10/2022  LOV: Visit date not found  What is the name of the medication or equipment? donepezil (ARICEPT) 5 MG tablet, rosuvastatin (CRESTOR) 10 MG tablet   Have you contacted your pharmacy to request a refill ? Yes   Which pharmacy would you like this sent to? Pt is having to change pharmacy due to insurance not covering [place] pt needs sent to [facility] on [address]    Patient notified that their request is being sent to the clinical staff for review and that they should receive a response within 2 business days.   Please advise at Mobile There is no such number on file (mobile).

## 2022-09-22 ENCOUNTER — Telehealth: Payer: Self-pay

## 2022-09-22 DIAGNOSIS — Z79899 Other long term (current) drug therapy: Secondary | ICD-10-CM

## 2022-09-22 DIAGNOSIS — E038 Other specified hypothyroidism: Secondary | ICD-10-CM

## 2022-09-22 DIAGNOSIS — F0280 Dementia in other diseases classified elsewhere without behavioral disturbance: Secondary | ICD-10-CM

## 2022-09-22 DIAGNOSIS — E782 Mixed hyperlipidemia: Secondary | ICD-10-CM

## 2022-09-22 MED ORDER — LEVOTHYROXINE SODIUM 75 MCG PO TABS: ORAL_TABLET | ORAL | 1 refills | Status: DC

## 2022-09-22 NOTE — Telephone Encounter (Signed)
Received via fax Rx request: Prescription sent electronically to pharmacy  

## 2022-09-22 NOTE — Telephone Encounter (Signed)
Recommend CBC, metabolic 7, lipid, TSH, free T4 Hypothyroidism, Alzheimer's, weight loss, hyperlipidemia, elevated hematocrit

## 2022-09-22 NOTE — Telephone Encounter (Signed)
Blood work ordered in [employer].    Telephone call no answer

## 2022-09-22 NOTE — Telephone Encounter (Signed)
Pt is needing blood work ordered before her appt for 03/21  Call back [phone]

## 2022-09-22 NOTE — Telephone Encounter (Signed)
Prescription Request  09/22/2022  LOV: Visit date not found  What is the name of the medication or equipment? levothyroxine (SYNTHROID) 75 MCG tablet   Have you contacted your pharmacy to request a refill? Yes   Which pharmacy would you like this sent to?  [id number] - [place], [place] - [address] [place] [address] Phone: [phone] Fax: [phone]    Patient notified that their request is being sent to the clinical staff for review and that they should receive a response within 2 business days.   Please advise at Mobile There is no such number on file (mobile).

## 2022-09-27 ENCOUNTER — Ambulatory Visit (INDEPENDENT_AMBULATORY_CARE_PROVIDER_SITE_OTHER): Payer: Medicare PPO | Admitting: Family Medicine

## 2022-09-27 VITALS — BP 120/80 | HR 88 | Ht 64.0 in | Wt 151.0 lb

## 2022-09-27 DIAGNOSIS — G309 Alzheimer's disease, unspecified: Secondary | ICD-10-CM

## 2022-09-27 DIAGNOSIS — G308 Other Alzheimer's disease: Secondary | ICD-10-CM

## 2022-09-27 DIAGNOSIS — F0280 Dementia in other diseases classified elsewhere without behavioral disturbance: Secondary | ICD-10-CM | POA: Diagnosis not present

## 2022-09-27 DIAGNOSIS — E039 Hypothyroidism, unspecified: Secondary | ICD-10-CM | POA: Diagnosis not present

## 2022-09-27 DIAGNOSIS — R54 Age-related physical debility: Secondary | ICD-10-CM | POA: Diagnosis not present

## 2022-09-27 DIAGNOSIS — E782 Mixed hyperlipidemia: Secondary | ICD-10-CM | POA: Diagnosis not present

## 2022-09-27 DIAGNOSIS — F02C0 Dementia in other diseases classified elsewhere, severe, without behavioral disturbance, psychotic disturbance, mood disturbance, and anxiety: Secondary | ICD-10-CM | POA: Diagnosis not present

## 2022-09-27 DIAGNOSIS — R5383 Other fatigue: Secondary | ICD-10-CM

## 2022-09-27 DIAGNOSIS — Z79899 Other long term (current) drug therapy: Secondary | ICD-10-CM

## 2022-09-27 DIAGNOSIS — E038 Other specified hypothyroidism: Secondary | ICD-10-CM | POA: Diagnosis not present

## 2022-09-27 NOTE — Progress Notes (Signed)
   Subjective:    Patient ID: Susan Mcneil, female    DOB: 1946-03-03, 77 y.o.   MRN: [id number]  HPI Patient arrives today for 6 month follow up.  Very nice patient  Spouse states patient has been dealing with congestion in her chest.  Recently with chest congestion some intermittent coughing but no high fever chills sweats no wheezing or difficulty breathing  Alzheimer disease (HCC)  High risk medication use  Severe Alzheimer's dementia of other onset without behavioral disturbance, psychotic disturbance, mood disturbance, or anxiety (HCC)  Mixed hyperlipidemia  Fatigue, unspecified type  Hypothyroidism, unspecified type  Frailty    Review of Systems     Objective:   Physical Exam General-in no acute distress Eyes-no discharge Lungs-respiratory rate normal, CTA CV-no murmurs,RRR Extremities skin warm dry no edema Neuro grossly normal Behavior normal, alert Patient able to extinguish pleasantries but not really able to understand any intricate conversation or discussion I do not find any evidence of pneumonia Husband is very caring and a good caretaker     Assessment & Plan:   We will connect with palliative care to see if there is some options to help her out  1. Alzheimer disease (HCC) She is at stage VI of Alzheimer's rating.  Husband is doing the best he can with her.  He does not get really any help.  Family does come by periodically. Patient does not show as much interest in eating as she used to Her weight has come down some.  She is still able to move about and have basic statements in response to questions but not the ability to go into a detailed discussion She does have some income  2. High risk medication use Labs are followed on a regular basis  3. Severe Alzheimer's dementia of other onset without behavioral disturbance, psychotic disturbance, mood disturbance, or anxiety (HCC) She is on her medications This is a degenerative disease We will  check into palliative care  4. Mixed hyperlipidemia For now we will continue with cholesterol medicine.  If she gets to the point she cannot take her medicine we will stop this or if her dementia starts crossing into stage VII  5. Fatigue, unspecified type Continue her thyroid medicine as planned  6. Hypothyroidism, unspecified type Continue thyroid medicine healthy diet  7. Frailty Fall risk, precautions discussed, follow-up if progressive issues They will complete labs today

## 2022-09-28 LAB — CBC WITH DIFFERENTIAL/PLATELET
Basophils Absolute: 0 10*3/uL (ref 0.0–0.2)
Basos: 0 %
EOS (ABSOLUTE): 0.1 10*3/uL (ref 0.0–0.4)
Eos: 2 %
Hematocrit: 46.7 % — ABNORMAL HIGH (ref 34.0–46.6)
Hemoglobin: 15.2 g/dL (ref 11.1–15.9)
Immature Grans (Abs): 0 10*3/uL (ref 0.0–0.1)
Immature Granulocytes: 0 %
Lymphocytes Absolute: 1.9 10*3/uL (ref 0.7–3.1)
Lymphs: 30 %
MCH: 29.6 pg (ref 26.6–33.0)
MCHC: 32.5 g/dL (ref 31.5–35.7)
MCV: 91 fL (ref 79–97)
Monocytes Absolute: 0.5 10*3/uL (ref 0.1–0.9)
Monocytes: 8 %
Neutrophils Absolute: 3.7 10*3/uL (ref 1.4–7.0)
Neutrophils: 60 %
Platelets: 194 10*3/uL (ref 150–450)
RBC: 5.13 x10E6/uL (ref 3.77–5.28)
RDW: 13.7 % (ref 11.7–15.4)
WBC: 6.2 10*3/uL (ref 3.4–10.8)

## 2022-09-28 LAB — BASIC METABOLIC PANEL
BUN/Creatinine Ratio: 20 (ref 12–28)
BUN: 15 mg/dL (ref 8–27)
CO2: 20 mmol/L (ref 20–29)
Calcium: 9.3 mg/dL (ref 8.7–10.3)
Chloride: 103 mmol/L (ref 96–106)
Creatinine, Ser: 0.75 mg/dL (ref 0.57–1.00)
Glucose: 103 mg/dL — ABNORMAL HIGH (ref 70–99)
Potassium: 4.7 mmol/L (ref 3.5–5.2)
Sodium: 141 mmol/L (ref 134–144)
eGFR: 82 mL/min/{1.73_m2} (ref >59)

## 2022-09-28 LAB — T4, FREE: Free T4: 1.55 ng/dL (ref 0.82–1.77)

## 2022-09-28 LAB — LIPID PANEL
Chol/HDL Ratio: 2.8 ratio (ref 0.0–4.4)
Cholesterol, Total: 168 mg/dL (ref 100–199)
HDL: 59 mg/dL (ref >39)
LDL Chol Calc (NIH): 87 mg/dL (ref 0–99)
Triglycerides: 124 mg/dL (ref 0–149)
VLDL Cholesterol Cal: 22 mg/dL (ref 5–40)

## 2022-09-28 LAB — TSH: TSH: 0.442 u[IU]/mL — ABNORMAL LOW (ref 0.450–4.500)

## 2022-09-30 NOTE — Telephone Encounter (Signed)
Patient has had lab work completed

## 2022-09-30 NOTE — Addendum Note (Signed)
Addended by: Margaretha Sheffield on: 09/30/2022 01:36 PM   Modules accepted: Orders

## 2022-10-11 ENCOUNTER — Encounter: Payer: Self-pay | Admitting: *Deleted

## 2022-11-11 ENCOUNTER — Other Ambulatory Visit: Payer: Self-pay | Admitting: Family Medicine

## 2023-01-17 DIAGNOSIS — E038 Other specified hypothyroidism: Secondary | ICD-10-CM | POA: Diagnosis not present

## 2023-01-18 ENCOUNTER — Ambulatory Visit (INDEPENDENT_AMBULATORY_CARE_PROVIDER_SITE_OTHER): Payer: Medicare PPO | Admitting: Family Medicine

## 2023-01-18 VITALS — BP 124/74 | HR 67 | Temp 97.7°F | Wt 144.6 lb

## 2023-01-18 DIAGNOSIS — Z23 Encounter for immunization: Secondary | ICD-10-CM | POA: Diagnosis not present

## 2023-01-18 DIAGNOSIS — E039 Hypothyroidism, unspecified: Secondary | ICD-10-CM | POA: Diagnosis not present

## 2023-01-18 DIAGNOSIS — R634 Abnormal weight loss: Secondary | ICD-10-CM

## 2023-01-18 DIAGNOSIS — G309 Alzheimer's disease, unspecified: Secondary | ICD-10-CM

## 2023-01-18 DIAGNOSIS — E782 Mixed hyperlipidemia: Secondary | ICD-10-CM

## 2023-01-18 DIAGNOSIS — F0280 Dementia in other diseases classified elsewhere without behavioral disturbance: Secondary | ICD-10-CM | POA: Diagnosis not present

## 2023-01-18 NOTE — Progress Notes (Signed)
   Subjective:    Patient ID: Susan Mcneil, female    DOB: 1946/03/09, 77 y.o.   MRN: [id number]  HPI Pt is here today for follow up visit. Pt husband states she is no longer recognizing things as she used to and appetite has lowered and causing weight loss. Pt family would also like to discuss ADTS services.   Review of Systems     Objective:   Physical Exam General-in no acute distress Eyes-no discharge Lungs-respiratory rate normal, CTA CV-no murmurs,RRR Extremities skin warm dry no edema Neuro grossly normal Behavior normal, alert        Assessment & Plan:   1. Alzheimer disease (HCC) Stage VI/early 7 Family would like to continue current medications We will look into trying to get extra help Will refer to Rosalva Ferron Will also look into day care so to speak 2. Mixed hyperlipidemia Continue cholesterol medicine for now  3. Hypothyroidism, unspecified type Continue medication family relates that they were as consistent giving it and sometimes she does not swallow it we will recheck the lab again in 3 months - TSH  4. Immunization due Go ahead today - Flu Vaccine Trivalent High Dose (Fluad)  5. Weight loss Encouraged high-calorie foods with a mixture of protein carbohydrates and fat

## 2023-01-21 ENCOUNTER — Telehealth: Payer: Self-pay | Admitting: Family Medicine

## 2023-01-21 DIAGNOSIS — F0280 Dementia in other diseases classified elsewhere without behavioral disturbance: Secondary | ICD-10-CM

## 2023-01-21 NOTE — Telephone Encounter (Signed)
Nurses Please go ahead with referral to [facility] hospice-for the purpose of chronic care management of Alzheimer's-family needs medical assistance with home care  Also please do a referral to social worker to see if there is a daytime Alzheimer's daycare such as a PACE like facility that family might be able to use during the day please have social worker do a consult  Diagnosis Alzheimer's

## 2023-01-23 NOTE — Telephone Encounter (Signed)
Palliative chronic care management-prefer the local branch and went [facility]

## 2023-01-23 NOTE — Telephone Encounter (Signed)
Referral ordered in EPIC.

## 2023-01-29 ENCOUNTER — Encounter: Payer: Self-pay | Admitting: Family Medicine

## 2023-01-29 NOTE — Progress Notes (Signed)
Please mail to the patient

## 2023-02-20 ENCOUNTER — Other Ambulatory Visit: Payer: Self-pay | Admitting: Family Medicine

## 2023-02-24 ENCOUNTER — Ambulatory Visit (INDEPENDENT_AMBULATORY_CARE_PROVIDER_SITE_OTHER): Payer: Medicare PPO

## 2023-02-24 VITALS — Ht 65.0 in | Wt 144.0 lb

## 2023-02-24 DIAGNOSIS — Z780 Asymptomatic menopausal state: Secondary | ICD-10-CM

## 2023-02-24 DIAGNOSIS — Z0000 Encounter for general adult medical examination without abnormal findings: Secondary | ICD-10-CM | POA: Diagnosis not present

## 2023-02-24 NOTE — Progress Notes (Signed)
 Susan Mcneil assisted with visit due to patients diagnosis of dementia  Because this visit was a virtual/telehealth visit,  certain criteria was not obtained, such a blood pressure, CBG if applicable, and timed get up and go. Any medications not marked as "taking" were not mentioned during the medication reconciliation part of the visit. Any vitals not documented were not able to be obtained due to this being a telehealth visit or patient was unable to self-report a recent blood pressure reading due to a lack of equipment at home via telehealth. Vitals that have been documented are verbally provided by the patient.   Subjective:   Susan Mcneil is a 77 y.o. female who presents for Medicare Annual (Subsequent) preventive examination.  Visit Complete: Virtual I connected with  Susan Mcneil on 02/24/23 by a audio enabled telemedicine application and verified that I am speaking with the correct person using two identifiers.  Patient Location: Home  Provider Location: Home Office  I discussed the limitations of evaluation and management by telemedicine. The patient expressed understanding and agreed to proceed.  Vital Signs: Because this visit was a virtual/telehealth visit, some criteria may be missing or patient reported. Any vitals not documented were not able to be obtained and vitals that have been documented are patient reported.  Patient Medicare AWV questionnaire was completed by the patient on na; I have confirmed that all information answered by patient is correct and no changes since this date.  Cardiac Risk Factors include: advanced age (>73men, >50 women);dyslipidemia;hypertension;sedentary lifestyle     Objective:    Today's Vitals   02/24/23 1500  Weight: 144 lb (65.3 kg)  Height: 5\' 5"  (1.651 m)   Body mass index is 23.96 kg/m.     02/24/2023    3:00 PM 05/03/2022    5:23 PM 01/17/2018    8:27 AM 10/27/2016   12:09 PM 07/13/2015    8:47 PM 04/21/2015    1:20 PM  04/11/2015    5:15 PM  Advanced Directives  Does Patient Have a Medical Advance Directive? No Yes No No No No No  Type of Furniture conservator/restorer;Living will       Does patient want to make changes to medical advance directive?  No - Guardian declined       Copy of Healthcare Power of Attorney in Chart?  No - copy requested       Would patient like information on creating a medical advance directive? Yes (MAU/Ambulatory/Procedural Areas - Information given)  Yes (MAU/Ambulatory/Procedural Areas - Information given) No - Patient declined  No - patient declined information No - patient declined information    Current Medications (verified) Outpatient Encounter Medications as of 02/24/2023  Medication Sig   donepezil (ARICEPT) 5 MG tablet TAKE 1 TABLET(5 MG) BY MOUTH DAILY   levothyroxine (SYNTHROID) 75 MCG tablet TAKE 1 TABLET BY MOUTH DAILY  MONDAY THROUGH SATURDAY AND 1  AND 1/2 TABLETS BY MOUTH ON  SUNDAY   memantine (NAMENDA) 10 MG tablet Take 1 tablet (10 mg total) by mouth 2 (two) times daily.   OVER THE COUNTER MEDICATION Vitamins A, B12, D, E , Omega Ultimum for over all health   rosuvastatin (CRESTOR) 10 MG tablet TAKE 1 TABLET BY MOUTH AT BEDTIME   triamcinolone cream (KENALOG) 0.1 % Apply thin amount bid prn to rash   No facility-administered encounter medications on file as of 02/24/2023.    Allergies (verified) Patient has no known allergies.  History: Past Medical History:  Diagnosis Date   Alzheimer's dementia (HCC) 11/21/2019   Angiomyolipoma of left kidney 06/14/2017   Followed by [facility], most recent testing shows it is stable, they recommending follow-up in 2 years, June 14, 2017-follow-up will be in 2021   Fatty liver    HTN (hypertension)    cholesterol & thyroid   Hypothyroidism    Kidney stone    Macular degeneration    Macular degeneration, bilateral    Memory disturbance    Neovascular glaucoma, right eye, severe stage  09/04/2019   Osteopenia    Osteoporosis    Reflux gastritis    Thyroid disorder    Past Surgical History:  Procedure Laterality Date   ABDOMINAL HYSTERECTOMY   patial, pre-uterine cancer, 1974   BIOPSY  10/27/2016   Procedure: BIOPSY;  Surgeon: Malissa Hippo, MD;  Location: [facility];  Service: Endoscopy;;  esophagus   BIOPSY  01/17/2018   Procedure: BIOPSY;  Surgeon: Malissa Hippo, MD;  [facility]: [facility];  Service: Endoscopy;;  colon   COLONOSCOPY N/A 04/01/2015   Procedure: COLONOSCOPY;  Surgeon: Malissa Hippo, MD;  [facility]: [facility];  Service: Endoscopy;  Laterality: N/A;  1030   COLONOSCOPY N/A 01/17/2018   Procedure: COLONOSCOPY;  Surgeon: Malissa Hippo, MD;  Location: [facility];  Service: Endoscopy;  Laterality: N/A;  9:30   DIAGNOSTIC LAPAROSCOPY     ESOPHAGOGASTRODUODENOSCOPY N/A 10/27/2016   Procedure: ESOPHAGOGASTRODUODENOSCOPY (EGD);  Surgeon: Malissa Hippo, MD;  Location: [facility];  Service: Endoscopy;  Laterality: N/A;  100   TOTAL ABDOMINAL HYSTERECTOMY W/ BILATERAL SALPINGOOPHORECTOMY     Family History  Problem Relation Age of Onset   Cancer Mother    Stroke Father    Social History   Socioeconomic History   Marital status: Married    Spouse name: Susan Mcneil   Number of children: Not on file   Years of education: 12   Highest education level: 12th grade  Occupational History   Occupation: retired    Associate Professor: RETIRED  Tobacco Use   Smoking status: Former    Current packs/day: 0.00    Types: Cigarettes    Quit date: 12/07/2000    Years since quitting: 22.2    Passive exposure: Past   Smokeless tobacco: Never   Tobacco comments:    Verified by Husband - Susan Mcneil  Vaping Use   Vaping status: Never Used  Substance and Sexual Activity   Alcohol use: No   Drug use: No   Sexual activity: Not Currently    Partners: Male  Other Topics Concern   Not on file  Social History Narrative   Not on file   Social Determinants  of Health   Financial Resource Strain: Low Risk  (02/24/2023)   Overall Financial Resource Strain (CARDIA)    Difficulty of Paying Living Expenses: Not hard at all  Food Insecurity: No Food Insecurity (02/24/2023)   Hunger Vital Sign    Worried About Running Out of Food in the Last Year: Never true    Ran Out of Food in the Last Year: Never true  Transportation Needs: No Transportation Needs (02/24/2023)   PRAPARE - Administrator, Civil Service (Medical): No    Lack of Transportation (Non-Medical): No  Physical Activity: Inactive (02/24/2023)   Exercise Vital Sign    Days of Exercise per Week: 0 days    Minutes of Exercise per Session: 0 min  Stress: No Stress Concern Present (02/24/2023)   [employer] of Occupational Health - Occupational Stress Questionnaire    Feeling of Stress : Not at all  Social Connections: Moderately Isolated (02/24/2023)   Social Connection and Isolation Panel [NHANES]    Frequency of Communication with Friends and Family: Never    Frequency of Social Gatherings with Friends and Family: More than three times a week    Attends Religious Services: Never    Database administrator or Organizations: No    Attends Engineer, structural: Never    Marital Status: Married    Tobacco Counseling Counseling given: Yes Tobacco comments: Verified by Husband Susan Mcneil   Clinical Intake:  Pre-visit preparation completed: Yes  Pain : No/denies pain     BMI - recorded: 23.96 Nutritional Status: BMI of 19-24  Normal Nutritional Risks: None Diabetes: No  How often do you need to have someone help you when you read instructions, pamphlets, or other written materials from your doctor or pharmacy?: 1 - Never  Interpreter Needed?: No  Information entered by ::  Kennis Wissmann, CMA   Activities of Daily Living    02/24/2023    3:10 PM 05/03/2022    5:19 PM  In your present state of health, do you have any difficulty performing the  following activities:  Hearing? 0 0  Vision? 0 1  Comment  Macular Degeneration  Difficulty concentrating or making decisions? 1 1  Comment dementia Alzheimer's Dementia  Walking or climbing stairs? 0 1  Comment  Alzheimer's Dementia  Dressing or bathing? 0 1  Comment  Alzheimer's Dementia  Doing errands, shopping? 1 1  Comment dementia so family takes her where she needs to go Alzheimer's Dementia  Preparing Food and eating ? Y Y  Comment unable to prepare food due to dementia Husband - Marlet Mulnix Assists  Using the Toilet? Malvin Johns  Comment wears depends Husband - Natalyn Verderber Assists  In the past six months, have you accidently leaked urine? Y Y  Comment  Husband - Sarahjean Hunte Assists  Do you have problems with loss of bowel control? Y Y  Comment  Husband - Marycarol Hause Assists  Managing your Medications? Malvin Johns  Comment Susan and spouse manage medications Husband - Debbera Hanan Assists  Managing your Finances? Malvin Johns  Comment Susan and spouse manage finances Husband - Hedda Mitri Assists  Housekeeping or managing your Housekeeping? Malvin Johns  Comment Susan and spouse manage the household Husband - Margarett Trabue Assists    Patient Care Team: Babs Sciara, MD as PCP - General  Indicate any recent Medical Services you may have received from other than [facility] providers in the past year (date may be approximate).     Assessment:   This is a routine wellness examination for Susan Mcneil.  Hearing/Vision screen Hearing Screening - Comments:: Patient does not have any hearing difficulties  Vision Screening - Comments:: Wears rx glasses - up to date with routine eye exams Dr. Charise Killian at [facility]    Goals Addressed             This Visit's Progress    Patient Stated       Patient has dementia and is unable to set a goal. Susan Mcneil states that they went to visit the senior citizen facility today to see about getting patient to start going to spend time outside of the home and around other  people.  Depression Screen    02/24/2023    3:05 PM 01/18/2023    1:20 PM 06/14/2022   11:44 AM 05/03/2022    5:15 PM 12/08/2020    2:22 PM 08/04/2020    8:58 AM 10/20/2016   11:28 AM  PHQ 2/9 Scores  PHQ - 2 Score 0 5 4 0 0 0 0  PHQ- 9 Score 0 21 12      Exception Documentation    Medical reason     Not completed    Verified by Husband Susan Mcneil       Fall Risk    02/24/2023    3:09 PM 01/18/2023    1:20 PM 06/14/2022   11:43 AM 05/03/2022    5:19 PM 12/08/2020    2:21 PM  Fall Risk   Falls in the past year? 0 0 0 1 0  Number falls in past yr: 0  0 1 0  Injury with Fall? 0  0 1 0  Risk for fall due to : No Fall Risks   History of fall(s);Impaired balance/gait;Impaired mobility;Impaired vision;Mental status change No Fall Risks  Follow up    Falls evaluation completed;Education provided;Falls prevention discussed Falls evaluation completed    MEDICARE RISK AT HOME: Medicare Risk at Home Any stairs in or around the home?: No If so, are there any without handrails?: No Home free of loose throw rugs in walkways, pet beds, electrical cords, etc?: Yes Adequate lighting in your home to reduce risk of falls?: Yes Life alert?: No Use of a cane, walker or w/c?: No Grab bars in the bathroom?: No Shower chair or bench in shower?: Yes Elevated toilet seat or a handicapped toilet?: Yes  TIMED UP AND GO:  Was the test performed?  No    Cognitive Function:    02/24/2023    3:05 PM  MMSE - Mini Mental State Exam  Not completed: Unable to complete      10/15/2019    3:18 PM 12/15/2017   10:01 AM  Montreal Cognitive Assessment   Visuospatial/ Executive (0/5) 0 3  Naming (0/3) 3 3  Attention: Read list of digits (0/2) 0 1  Attention: Read list of letters (0/1) 1 1  Attention: Serial 7 subtraction starting at 100 (0/3) 0 2  Language: Repeat phrase (0/2) 2 2  Language : Fluency (0/1) 0 0  Abstraction (0/2) 1 2  Delayed Recall (0/5) 0 0  Orientation (0/6) 5 3  Total 12  17  Adjusted Score (based on education) 12 18      Immunizations Immunization History  Administered Date(s) Administered   Fluad Quad(high Dose 65+) 02/27/2020, 01/25/2022   Fluad Trivalent(High Dose 65+) 01/18/2023   Influenza,inj,Quad PF,6+ Mos 03/10/2016, 02/17/2017, 07/26/2018   Influenza-Unspecified 02/20/2013, 02/12/2014   Moderna Sars-Covid-2 Vaccination 07/19/2019, 08/15/2019   Pneumococcal Conjugate-13 11/20/2013   Pneumococcal Polysaccharide-23 04/11/2016    TDAP status: Due, Education has been provided regarding the importance of this vaccine. Advised may receive this vaccine at local pharmacy or Health Dept. Aware to provide a copy of the vaccination record if obtained from local pharmacy or Health Dept. Verbalized acceptance and understanding.  Flu Vaccine status: Up to date  Pneumococcal vaccine status: Up to date  Covid-19 vaccine status: Information provided on how to obtain vaccines.   Qualifies for Shingles Vaccine? Yes   Zostavax completed No   Shingrix Completed?: No.    Education has been provided regarding the importance of this vaccine. Patient has been advised to  call insurance company to determine out of pocket expense if they have not yet received this vaccine. Advised may also receive vaccine at local pharmacy or Health Dept. Verbalized acceptance and understanding.  Screening Tests Health Maintenance  Topic Date Due   DTaP/Tdap/Td (1 - Tdap) Never done   Zoster Vaccines- Shingrix (1 of 2) Never done   Medicare Annual Wellness (AWV)  07/06/2013   COVID-19 Vaccine (3 - Moderna risk series) 09/12/2019   Pneumonia Vaccine 77+ Years old  Completed   INFLUENZA VACCINE  Completed   DEXA SCAN  Completed   Hepatitis C Screening  Completed   HPV VACCINES  Aged Out   Colonoscopy  Discontinued    Health Maintenance  Health Maintenance Due  Topic Date Due   DTaP/Tdap/Td (1 - Tdap) Never done   Zoster Vaccines- Shingrix (1 of 2) Never done   Medicare  Annual Wellness (AWV)  07/06/2013   COVID-19 Vaccine (3 - Moderna risk series) 09/12/2019    Colorectal cancer screening: No longer required.   Mammogram status: No longer required due to advanced dementia.  Bone Density status: Ordered 02/24/2023. Pt provided with contact info and advised to call to schedule appt.  Lung Cancer Screening: (Low Dose CT Chest recommended if Age 41-80 years, 20 pack-year currently smoking OR have quit w/in 15years.) does not qualify.   Lung Cancer Screening Referral: na  Additional Screening:  Hepatitis C Screening: does not qualify; Completed 09/27/2017  Vision Screening: Recommended annual ophthalmology exams for early detection of glaucoma and other disorders of the eye. Is the patient up to date with their annual eye exam?  Yes  Who is the provider or what is the name of the office in which the patient attends annual eye exams? Dr. Daisy Lazar w/ [facility] in [place] If pt is not established with a provider, would they like to be referred to a provider to establish care? No .   Dental Screening: Recommended annual dental exams for proper oral hygiene  Diabetic Foot Exam: na  Community Resource Referral / Chronic Care Management: CRR required this visit?  No   CCM required this visit?  No     Plan:     I have personally reviewed and noted the following in the patient's chart:   Medical and social history Use of alcohol, tobacco or illicit drugs  Current medications and supplements including opioid prescriptions. Patient is not currently taking opioid prescriptions. Functional ability and status Nutritional status Physical activity Advanced directives List of other physicians Hospitalizations, surgeries, and ER visits in previous 12 months Vitals Screenings to include cognitive, depression, and falls Referrals and appointments  In addition, I have reviewed and discussed with patient certain preventive protocols, quality  metrics, and best practice recommendations. A written personalized care plan for preventive services as well as general preventive health recommendations were provided to patient.     Jordan Hawks Ciji Boston, CMA   02/24/2023   After Visit Summary: (MyChart) Due to this being a telephonic visit, the after visit summary with patients personalized plan was offered to patient via MyChart   Nurse Notes: see routing comment

## 2023-02-24 NOTE — Patient Instructions (Signed)
Susan Mcneil , Thank you for taking time to come for your Medicare Wellness Visit. I appreciate your ongoing commitment to your health goals. Please review the following plan we discussed and let me know if I can assist you in the future.   Referrals/Orders/Follow-Ups/Clinician Recommendations:  Next Medicare Annual Wellness Visit: March 01, 2024 at 1:50 pm virtual visit  You have an order for:  []   2D Mammogram  []   3D Mammogram  [x]   Bone Density   []   Lung Cancer Screening  Please call for appointment:   [facility] [address] [place], [place] [address] [phone]  Make sure to wear two-piece clothing.  No lotions powders or deodorants the day of the appointment Make sure to bring picture ID and insurance card.  Bring list of medications you are currently taking including any supplements.   Schedule your [place] screening mammogram through MyChart!   Log into your MyChart account.  Go to 'Visit' (or 'Appointments' if on mobile App) --> Schedule an Appointment  Under 'Select a Reason for Visit' choose the Mammogram Screening option.  Complete the pre-visit questions and select the time and place that best fits your schedule.    This is a list of the screening recommended for you and due dates:  Health Maintenance  Topic Date Due   DTaP/Tdap/Td vaccine (1 - Tdap) Never done   Zoster (Shingles) Vaccine (1 of 2) Never done   COVID-19 Vaccine (3 - Moderna risk series) 09/12/2019   Medicare Annual Wellness Visit  02/24/2024   Pneumonia Vaccine  Completed   Flu Shot  Completed   DEXA scan (bone density measurement)  Completed   Hepatitis C Screening  Completed   HPV Vaccine  Aged Out   Colon Cancer Screening  Discontinued    Advanced directives: (Provided) Advance directive discussed with you today. I have provided a copy for you to complete at home and have notarized. Once this is complete, please bring a copy in to our  office so we can scan it into your chart.   Next Medicare Annual Wellness Visit scheduled for next year: Yes  Preventive Care 26 Years and Older, Female Preventive care refers to lifestyle choices and visits with your health care provider that can promote health and wellness. Preventive care visits are also called wellness exams. What can I expect for my preventive care visit? Counseling Your health care provider may ask you questions about your: Medical history, including: Past medical problems. Family medical history. Pregnancy and menstrual history. History of falls. Current health, including: Memory and ability to understand (cognition). Emotional well-being. Home life and relationship well-being. Sexual activity and sexual health. Lifestyle, including: Alcohol, nicotine or tobacco, and drug use. Access to firearms. Diet, exercise, and sleep habits. Work and work Astronomer. Sunscreen use. Safety issues such as seatbelt and bike helmet use. Physical exam Your health care provider will check your: Height and weight. These may be used to calculate your BMI (body mass index). BMI is a measurement that tells if you are at a healthy weight. Waist circumference. This measures the distance around your waistline. This measurement also tells if you are at a healthy weight and may help predict your risk of certain diseases, such as type 2 diabetes and high blood pressure. Heart rate and blood pressure. Body temperature. Skin for abnormal spots. What immunizations do I need?  Vaccines are usually given at various ages, according to a schedule. Your health care  provider will recommend vaccines for you based on your age, medical history, and lifestyle or other factors, such as travel or where you work. What tests do I need? Screening Your health care provider may recommend screening tests for certain conditions. This may include: Lipid and cholesterol levels. Hepatitis C  test. Hepatitis B test. HIV (human immunodeficiency virus) test. STI (sexually transmitted infection) testing, if you are at risk. Lung cancer screening. Colorectal cancer screening. Diabetes screening. This is done by checking your blood sugar (glucose) after you have not eaten for a while (fasting). Mammogram. Talk with your health care provider about how often you should have regular mammograms. BRCA-related cancer screening. This may be done if you have a family history of breast, ovarian, tubal, or peritoneal cancers. Bone density scan. This is done to screen for osteoporosis. Talk with your health care provider about your test results, treatment options, and if necessary, the need for more tests. Follow these instructions at home: Eating and drinking  Eat a diet that includes fresh fruits and vegetables, whole grains, lean protein, and low-fat dairy products. Limit your intake of foods with high amounts of sugar, saturated fats, and salt. Take vitamin and mineral supplements as recommended by your health care provider. Do not drink alcohol if your health care provider tells you not to drink. If you drink alcohol: Limit how much you have to 0-1 drink a day. Know how much alcohol is in your drink. In the [place] one drink equals one 12 oz bottle of beer (355 mL), one 5 oz glass of wine (148 mL), or one 1 oz glass of hard liquor (44 mL). Lifestyle Brush your teeth every morning and night with fluoride toothpaste. Floss one time each day. Exercise for at least 30 minutes 5 or more days each week. Do not use any products that contain nicotine or tobacco. These products include cigarettes, chewing tobacco, and vaping devices, such as e-cigarettes. If you need help quitting, ask your health care provider. Do not use drugs. If you are sexually active, practice safe sex. Use a condom or other form of protection in order to prevent STIs. Take aspirin only as told by your health care provider.  Make sure that you understand how much to take and what form to take. Work with your health care provider to find out whether it is safe and beneficial for you to take aspirin daily. Ask your health care provider if you need to take a cholesterol-lowering medicine (statin). Find healthy ways to manage stress, such as: Meditation, yoga, or listening to music. Journaling. Talking to a trusted person. Spending time with friends and family. Minimize exposure to UV radiation to reduce your risk of skin cancer. Safety Always wear your seat belt while driving or riding in a vehicle. Do not drive: If you have been drinking alcohol. Do not ride with someone who has been drinking. When you are tired or distracted. While texting. If you have been using any mind-altering substances or drugs. Wear a helmet and other protective equipment during sports activities. If you have firearms in your house, make sure you follow all gun safety procedures. What's next? Visit your health care provider once a year for an annual wellness visit. Ask your health care provider how often you should have your eyes and teeth checked. Stay up to date on all vaccines. This information is not intended to replace advice given to you by your health care provider. Make sure you discuss any questions you have with  your health care provider. Document Revised: 10/21/2020 Document Reviewed: 10/21/2020 Elsevier Patient Education  2024 [employer]. Understanding Your Risk for Falls Millions of people have serious injuries from falls each year. It is important to understand your risk of falling. Talk with your health care provider about your risk and what you can do to lower it. If you do have a serious fall, make sure to tell your provider. Falling once raises your risk of falling again. How can falls affect me? Serious injuries from falls are common. These include: Broken bones, such as hip fractures. Head injuries, such as  traumatic brain injuries (TBI) or concussions. A fear of falling can cause you to avoid activities and stay at home. This can make your muscles weaker and raise your risk for a fall. What can increase my risk? There are a number of risk factors that increase your risk for falling. The more risk factors you have, the higher your risk of falling. Serious injuries from a fall happen most often to people who are older than 78 years old. Teenagers and young adults ages 53-29 are also at higher risk. Common risk factors include: Weakness in the lower body. Being generally weak or confused due to long-term (chronic) illness. Dizziness or balance problems. Poor vision. Medicines that cause dizziness or drowsiness. These may include: Medicines for your blood pressure, heart, anxiety, insomnia, or swelling (edema). Pain medicines. Muscle relaxants. Other risk factors include: Drinking alcohol. Having had a fall in the past. Having foot pain or wearing improper footwear. Working at a dangerous job. Having any of the following in your home: Tripping hazards, such as floor clutter or loose rugs. Poor lighting. Pets. Having dementia or memory loss. What actions can I take to lower my risk of falling?     Physical activity Stay physically fit. Do strength and balance exercises. Consider taking a regular class to build strength and balance. Yoga and tai chi are good options. Vision Have your eyes checked every year and your prescription for glasses or contacts updated as needed. Shoes and walking aids Wear non-skid shoes. Wear shoes that have rubber soles and low heels. Do not wear high heels. Do not walk around the house in socks or slippers. Use a cane or walker as told by your provider. Home safety Attach secure railings on both sides of your stairs. Install grab bars for your bathtub, shower, and toilet. Use a non-skid mat in your bathtub or shower. Attach bath mats securely with  double-sided, non-slip rug tape. Use good lighting in all rooms. Keep a flashlight near your bed. Make sure there is a clear path from your bed to the bathroom. Use night-lights. Do not use throw rugs. Make sure all carpeting is taped or tacked down securely. Remove all clutter from walkways and stairways, including extension cords. Repair uneven or broken steps and floors. Avoid walking on icy or slippery surfaces. Walk on the grass instead of on icy or slick sidewalks. Use ice melter to get rid of ice on walkways in the winter. Use a cordless phone. Questions to ask your health care provider Can you help me check my risk for a fall? Do any of my medicines make me more likely to fall? Should I take a vitamin D supplement? What exercises can I do to improve my strength and balance? Should I make an appointment to have my vision checked? Do I need a bone density test to check for weak bones (osteoporosis)? Would it help to use  a cane or a walker? Where to find more information Centers for Disease Control and Prevention, STEADI: TonerPromos.no Community-Based Fall Prevention Programs: TonerPromos.no [employer] on Aging: BaseRingTones.pl Contact a health care provider if: You fall at home. You are afraid of falling at home. You feel weak, drowsy, or dizzy. This information is not intended to replace advice given to you by your health care provider. Make sure you discuss any questions you have with your health care provider. Document Revised: 12/27/2021 Document Reviewed: 12/27/2021 Elsevier Patient Education  2024 Elsevier Inc. Bone Density Test A bone density test uses a type of X-ray to measure the amount of calcium and other minerals in a person's bones. It can measure bone density in the hip and the spine. The test is similar to having a regular X-ray. This test may also be called: Bone densitometry. Bone mineral density test. Dual-energy X-ray absorptiometry (DEXA). You may have this test  to: Diagnose a condition that causes weak or thin bones (osteoporosis). Screen you for osteoporosis. Predict your risk for a broken bone (fracture). Determine how well your osteoporosis treatment is working. Tell a health care provider about: Any allergies you have. All medicines you are taking, including vitamins, herbs, eye drops, creams, and over-the-counter medicines. Any problems you or family members have had with anesthetic medicines. Any blood disorders you have. Any surgeries you have had. Any medical conditions you have. Whether you are pregnant or may be pregnant. Any medical tests you have had within the past 14 days that used contrast material. What are the risks? Generally, this is a safe test. However, it does expose you to a small amount of radiation, which can slightly increase your cancer risk. What happens before the test? Do not take any calcium supplements within the 24 hours before your test. You will need to remove all metal jewelry, eyeglasses, removable dental appliances, and any other metal objects on your body. What happens during the test?  You will lie down on an exam table. There will be an X-ray generator below you and an imaging device above you. Other devices, such as boxes or braces, may be used to position your body properly for the scan. The machine will slowly scan your body. You will need to keep very still while the machine does the scan. The images will show up on a screen in the room. Images will be examined by a specialist after your test is finished. The procedure may vary among health care providers and hospitals. What can I expect after the test? It is up to you to get the results of your test. Ask your health care provider, or the department that is doing the test, when your results will be ready. Summary A bone density test is an imaging test that uses a type of X-ray to measure the amount of calcium and other minerals in your bones. The  test may be used to diagnose or screen you for a condition that causes weak or thin bones (osteoporosis), predict your risk for a broken bone (fracture), or determine how well your osteoporosis treatment is working. Do not take any calcium supplements within 24 hours before your test. Ask your health care provider, or the department that is doing the test, when your results will be ready. This information is not intended to replace advice given to you by your health care provider. Make sure you discuss any questions you have with your health care provider. Document Revised: 01/06/2021 Document Reviewed: 10/10/2019 Elsevier Patient  Education  2024 [employer].

## 2023-03-25 ENCOUNTER — Other Ambulatory Visit: Payer: Self-pay | Admitting: Family Medicine

## 2023-04-17 DIAGNOSIS — E039 Hypothyroidism, unspecified: Secondary | ICD-10-CM | POA: Diagnosis not present

## 2023-04-18 LAB — TSH: TSH: 1.42 u[IU]/mL (ref 0.450–4.500)

## 2023-04-19 ENCOUNTER — Encounter: Payer: Self-pay | Admitting: Family Medicine

## 2023-04-19 ENCOUNTER — Ambulatory Visit: Payer: Medicare PPO | Admitting: Family Medicine

## 2023-04-19 VITALS — BP 112/73 | HR 82 | Ht 65.0 in | Wt 143.0 lb

## 2023-04-19 DIAGNOSIS — G309 Alzheimer's disease, unspecified: Secondary | ICD-10-CM

## 2023-04-19 DIAGNOSIS — E039 Hypothyroidism, unspecified: Secondary | ICD-10-CM

## 2023-04-19 DIAGNOSIS — F0280 Dementia in other diseases classified elsewhere without behavioral disturbance: Secondary | ICD-10-CM | POA: Diagnosis not present

## 2023-04-19 DIAGNOSIS — R634 Abnormal weight loss: Secondary | ICD-10-CM | POA: Diagnosis not present

## 2023-04-19 NOTE — Progress Notes (Signed)
   Subjective:    Patient ID: Susan Mcneil, female    DOB: Sep 16, 1945, 77 y.o.   MRN: [id number]  HPI  Patient with advanced Alzheimer's She is dependent upon pull-ups for bowel movements and urination Family bathes her twice a week Patient is able to have minimal conversation Occasional hallucinations No agitation Activity level minimal Holding her weight steady Taking her thyroid medicine   Review of Systems     Objective:   Physical Exam General-in no acute distress Eyes-no discharge Lungs-respiratory rate normal, CTA CV-no murmurs,RRR Extremities skin warm dry no edema Neuro grossly normal Behavior normal, alert        Assessment & Plan:  Patient with advanced Alzheimer Totally dependent on her husband and family members for care Long-term prognosis poor Family is chosen to come off of Namenda and Aricept This is supported by [place] Eventually patient will get to the point where she will be bedbound but for now she seems to be holding around Continue thyroid medicine as is TSH looks good Follow-up again in 3 to 4 months Patient has had minimal weight loss sometimes she eats well other times she does not eat much She tends to drink only Pepsi and not water  We did counsel regarding what to watch for in regards to trying to keep the behavioral aspects of Alzheimer's under decent control We also discussed that if she starts to become agitated or having some major setbacks to notify us We also discussed how to minimize wandering in the provide safety in the household

## 2023-08-24 ENCOUNTER — Ambulatory Visit (INDEPENDENT_AMBULATORY_CARE_PROVIDER_SITE_OTHER): Payer: Medicare PPO | Admitting: Family Medicine

## 2023-08-24 ENCOUNTER — Encounter: Payer: Self-pay | Admitting: Family Medicine

## 2023-08-24 VITALS — BP 120/64 | HR 114 | Temp 98.0°F | Ht 65.0 in | Wt 127.0 lb

## 2023-08-24 DIAGNOSIS — F02C0 Dementia in other diseases classified elsewhere, severe, without behavioral disturbance, psychotic disturbance, mood disturbance, and anxiety: Secondary | ICD-10-CM

## 2023-08-24 DIAGNOSIS — G308 Other Alzheimer's disease: Secondary | ICD-10-CM | POA: Diagnosis not present

## 2023-08-24 NOTE — Progress Notes (Signed)
   Subjective:    Patient ID: GLORISTINE TURRUBIATES, female    DOB: [birth year], 78 y.o.   MRN: [id number]  HPI 4 month f/u  Alzheimer disease  Hypothyroidism  Patient with advanced Alzheimer's Patient at this point leaks small amounts intermittently and sometimes eats well drinks some liquids but not a lot mainly Pepsi She needs help with clothing bathing bathroom changing.  She has to be monitored 24/7.  Has a tendency to walk around a lot Family has locks on the doors She has not wandered Her husband is the sole caregiver occasionally family helps out they do have an aide that comes to the house twice a week The patient herself is pleasant but her verbal interaction is low Her weight was 143 it is now down to 127  Review of Systems     Objective:   Physical Exam General-in no acute distress Eyes-no discharge Lungs-respiratory rate normal, CTA CV-no murmurs,RRR Extremities skin warm dry no edema Neuro grossly normal Behavior normal, alert        Assessment & Plan:   Patient with severe Alzheimer's Referral to [facility]-she would benefit from ongoing care Currently husband does not want to place her but he is interested in meeting with the social worker He is a [place] member he is uncertain if they have services that might help her We will go ahead with a consultation with chronic long-term care outpatient with [facility] Also I would recommend social worker with them consult with the husband regarding process for placement and whether or not [facility] could potentially help versus having to go through [facility] system for now the husband would like to treat her at home  I did discuss end-of-life planning right now husband is not ready to commit to DNR approach for comfort care approach but we will discuss this more at follow-up visit  Follow-up in 3 months sooner if any problems

## 2023-08-29 ENCOUNTER — Encounter: Payer: Self-pay | Admitting: Family Medicine

## 2023-08-29 ENCOUNTER — Other Ambulatory Visit: Payer: Self-pay

## 2023-08-29 ENCOUNTER — Other Ambulatory Visit: Payer: Self-pay | Admitting: *Deleted

## 2023-08-29 DIAGNOSIS — R609 Edema, unspecified: Secondary | ICD-10-CM

## 2023-08-29 DIAGNOSIS — E038 Other specified hypothyroidism: Secondary | ICD-10-CM

## 2023-08-29 DIAGNOSIS — G308 Other Alzheimer's disease: Secondary | ICD-10-CM

## 2023-08-29 NOTE — Telephone Encounter (Signed)
 Nurses The mild swelling in the legs more than likely related to her lack of activity and then also to with her Alzheimer's not eating as well as she used to It would be reasonable to do some blood work at [facility] I would also recommend OTC knee-high compression stockings The prescription compression stockings are often very difficult to get on OTC compression stockings are little easier to get on Put them on in the morning and may remove them toward bedtime Obviously if she is not tolerating having people help her put these on then it may not be worth using  As for blood work I would recommend TSH, free T4, metabolic 7 this can be done at their convenience   Susan Mcneil has a follow-up visit in July if family feels she needs to be seen sooner please send me a message and we can work her in thank you-Dr. Wenona Hamilton do the above and please share the message with Marylynne daughter thank you

## 2023-09-14 DIAGNOSIS — F03B2 Unspecified dementia, moderate, with psychotic disturbance: Secondary | ICD-10-CM | POA: Diagnosis not present

## 2023-09-14 DIAGNOSIS — Z515 Encounter for palliative care: Secondary | ICD-10-CM | POA: Diagnosis not present

## 2023-09-14 DIAGNOSIS — F03B11 Unspecified dementia, moderate, with agitation: Secondary | ICD-10-CM | POA: Diagnosis not present

## 2023-11-02 DIAGNOSIS — F03B11 Unspecified dementia, moderate, with agitation: Secondary | ICD-10-CM | POA: Diagnosis not present

## 2023-11-02 DIAGNOSIS — F03B2 Unspecified dementia, moderate, with psychotic disturbance: Secondary | ICD-10-CM | POA: Diagnosis not present

## 2023-11-02 DIAGNOSIS — Z515 Encounter for palliative care: Secondary | ICD-10-CM | POA: Diagnosis not present

## 2023-11-15 ENCOUNTER — Ambulatory Visit: Admitting: Family Medicine

## 2023-11-15 VITALS — BP 118/74 | HR 72 | Temp 97.3°F | Ht 65.0 in | Wt 118.4 lb

## 2023-11-15 DIAGNOSIS — R634 Abnormal weight loss: Secondary | ICD-10-CM | POA: Diagnosis not present

## 2023-11-15 DIAGNOSIS — G308 Other Alzheimer's disease: Secondary | ICD-10-CM | POA: Diagnosis not present

## 2023-11-15 DIAGNOSIS — E038 Other specified hypothyroidism: Secondary | ICD-10-CM | POA: Diagnosis not present

## 2023-11-15 DIAGNOSIS — E039 Hypothyroidism, unspecified: Secondary | ICD-10-CM | POA: Diagnosis not present

## 2023-11-15 DIAGNOSIS — Z66 Do not resuscitate: Secondary | ICD-10-CM | POA: Diagnosis not present

## 2023-11-15 DIAGNOSIS — H353211 Exudative age-related macular degeneration, right eye, with active choroidal neovascularization: Secondary | ICD-10-CM

## 2023-11-15 DIAGNOSIS — F02C0 Dementia in other diseases classified elsewhere, severe, without behavioral disturbance, psychotic disturbance, mood disturbance, and anxiety: Secondary | ICD-10-CM

## 2023-11-15 DIAGNOSIS — R609 Edema, unspecified: Secondary | ICD-10-CM | POA: Diagnosis not present

## 2023-11-15 NOTE — Progress Notes (Signed)
   Subjective:    Patient ID: Susan Mcneil, female    DOB: 09-May-1946, 78 y.o.   MRN: [id number]  HPI  Discussed the use of AI scribe software for clinical note transcription with the patient, who gave verbal consent to proceed.  History of Present Illness   The patient is a 78 year old with Alzheimer's disease who presents with concerns about nutritional intake and weight loss. She is accompanied by her family, including a caregiver.  She has been experiencing weight loss despite her family's efforts to encourage her to eat by offering protein shakes and meals. She often shows little interest in food and sometimes eats late into the afternoon, but her weight continues to decline.  Her strength varies; she is able to walk and get up from a chair. She occasionally wanders at night but generally sleeps well. Her alertness and awareness fluctuate, and she sometimes helps 'people that have gone off.'  She has had incidents of trying to leave the house, resulting in minor injuries like mashed fingers. Her family has installed latches on doors to prevent her from wandering outside. She occasionally goes out for doctor appointments and short trips around town, but longer outings have been challenging.  She does not exhibit aggressive tendencies, although she can pull away from her caregiver unexpectedly. Her family is managing her care at home with support from [facility], who visits monthly.  She is on thyroid  medication, which her family tries to administer daily, although she sometimes refuses it. Her bowel movements are regular, and she has no issues with them.     Her vision seems to be good but she is not capable going to up gemologist   Review of Systems     Objective:   Physical Exam   General-in no acute distress Eyes-no discharge Lungs-respiratory rate normal, CTA CV-no murmurs,RRR Extremities skin warm dry no edema Neuro grossly normal Behavior normal, alert      Assessment &  Plan:  1. Hypothyroidism, unspecified type (Primary) Continue medication check lab work await results  2. Severe Alzheimer's dementia of other onset without behavioral disturbance, psychotic disturbance, mood disturbance, or anxiety (HCC) Poor long-term outlook Losing weight Appetite is low In the long run patient will get weaker and get to the point she cannot move around well and will become bedbound family for now able to take care of her they are doing a very good job there is also hospice involved as well patient is DNR We will provide supportive measures and answer questions along the way follow-up within 3 to 4 months  3. Weight loss This is related to poor p.o. intake related to Alzheimer's family doing a good job of encouraging her to eat but unfortunately this is just not going to go well  Macular degeneration-unable to go to ophthalmology Tolerating as best she can

## 2023-11-16 LAB — BASIC METABOLIC PANEL WITH GFR
BUN/Creatinine Ratio: 16 (ref 12–28)
BUN: 13 mg/dL (ref 8–27)
CO2: 22 mmol/L (ref 20–29)
Calcium: 9.5 mg/dL (ref 8.7–10.3)
Chloride: 105 mmol/L (ref 96–106)
Creatinine, Ser: 0.8 mg/dL (ref 0.57–1.00)
Glucose: 80 mg/dL (ref 70–99)
Potassium: 3.7 mmol/L (ref 3.5–5.2)
Sodium: 145 mmol/L — ABNORMAL HIGH (ref 134–144)
eGFR: 75 mL/min/1.73 (ref >59)

## 2023-11-16 LAB — TSH+FREE T4
Free T4: 0.89 ng/dL (ref 0.82–1.77)
TSH: 30 u[IU]/mL — ABNORMAL HIGH (ref 0.450–4.500)

## 2023-11-18 ENCOUNTER — Ambulatory Visit: Payer: Self-pay | Admitting: Family Medicine

## 2023-11-21 ENCOUNTER — Other Ambulatory Visit: Payer: Self-pay

## 2023-11-21 MED ORDER — LEVOTHYROXINE SODIUM 75 MCG PO TABS: ORAL_TABLET | ORAL | 1 refills | Status: AC

## 2023-11-23 ENCOUNTER — Ambulatory Visit: Payer: Self-pay

## 2023-11-23 ENCOUNTER — Other Ambulatory Visit: Payer: Self-pay

## 2023-11-23 ENCOUNTER — Emergency Department (HOSPITAL_COMMUNITY)

## 2023-11-23 ENCOUNTER — Emergency Department (HOSPITAL_COMMUNITY)
Admission: EM | Admit: 2023-11-23 | Discharge: 2023-11-23 | Disposition: A | Discharge status: Home / Self Care | Attending: Emergency Medicine | Admitting: Emergency Medicine

## 2023-11-23 ENCOUNTER — Encounter (HOSPITAL_COMMUNITY): Payer: Self-pay | Admitting: Emergency Medicine

## 2023-11-23 DIAGNOSIS — Z79899 Other long term (current) drug therapy: Secondary | ICD-10-CM | POA: Diagnosis not present

## 2023-11-23 DIAGNOSIS — S0591XA Unspecified injury of right eye and orbit, initial encounter: Secondary | ICD-10-CM | POA: Diagnosis present

## 2023-11-23 DIAGNOSIS — E039 Hypothyroidism, unspecified: Secondary | ICD-10-CM | POA: Insufficient documentation

## 2023-11-23 DIAGNOSIS — F0390 Unspecified dementia without behavioral disturbance: Secondary | ICD-10-CM | POA: Diagnosis not present

## 2023-11-23 DIAGNOSIS — E876 Hypokalemia: Secondary | ICD-10-CM | POA: Insufficient documentation

## 2023-11-23 DIAGNOSIS — S0033XA Contusion of nose, initial encounter: Secondary | ICD-10-CM | POA: Insufficient documentation

## 2023-11-23 DIAGNOSIS — W06XXXA Fall from bed, initial encounter: Secondary | ICD-10-CM | POA: Diagnosis not present

## 2023-11-23 DIAGNOSIS — Z87891 Personal history of nicotine dependence: Secondary | ICD-10-CM | POA: Diagnosis not present

## 2023-11-23 DIAGNOSIS — W19XXXA Unspecified fall, initial encounter: Secondary | ICD-10-CM

## 2023-11-23 DIAGNOSIS — S01111A Laceration without foreign body of right eyelid and periocular area, initial encounter: Secondary | ICD-10-CM | POA: Diagnosis not present

## 2023-11-23 DIAGNOSIS — R9082 White matter disease, unspecified: Secondary | ICD-10-CM | POA: Diagnosis not present

## 2023-11-23 DIAGNOSIS — Z043 Encounter for examination and observation following other accident: Secondary | ICD-10-CM | POA: Diagnosis not present

## 2023-11-23 DIAGNOSIS — S0083XA Contusion of other part of head, initial encounter: Secondary | ICD-10-CM | POA: Diagnosis not present

## 2023-11-23 DIAGNOSIS — F0280 Dementia in other diseases classified elsewhere without behavioral disturbance: Secondary | ICD-10-CM | POA: Insufficient documentation

## 2023-11-23 DIAGNOSIS — S01119A Laceration without foreign body of unspecified eyelid and periocular area, initial encounter: Secondary | ICD-10-CM | POA: Diagnosis not present

## 2023-11-23 DIAGNOSIS — G309 Alzheimer's disease, unspecified: Secondary | ICD-10-CM | POA: Diagnosis not present

## 2023-11-23 DIAGNOSIS — R9089 Other abnormal findings on diagnostic imaging of central nervous system: Secondary | ICD-10-CM | POA: Diagnosis not present

## 2023-11-23 LAB — CBC
HCT: 40 % (ref 36.0–46.0)
Hemoglobin: 13 g/dL (ref 12.0–15.0)
MCH: 30.7 pg (ref 26.0–34.0)
MCHC: 32.5 g/dL (ref 30.0–36.0)
MCV: 94.3 fL (ref 80.0–100.0)
Platelets: 232 10*3/uL (ref 150–400)
RBC: 4.24 MIL/uL (ref 3.87–5.11)
RDW: 15 % (ref 11.5–15.5)
WBC: 6.4 10*3/uL (ref 4.0–10.5)
nRBC: 0 % (ref 0.0–0.2)

## 2023-11-23 LAB — BASIC METABOLIC PANEL WITH GFR
Anion gap: 11 (ref 5–15)
BUN: 16 mg/dL (ref 8–23)
CO2: 23 mmol/L (ref 22–32)
Calcium: 9.1 mg/dL (ref 8.9–10.3)
Chloride: 107 mmol/L (ref 98–111)
Creatinine, Ser: 0.66 mg/dL (ref 0.44–1.00)
GFR, Estimated: >60 mL/min
Glucose, Bld: 86 mg/dL (ref 70–99)
Potassium: 3.1 mmol/L — ABNORMAL LOW (ref 3.5–5.1)
Sodium: 141 mmol/L (ref 135–145)

## 2023-11-23 LAB — CK: Total CK: 155 U/L (ref 38–234)

## 2023-11-23 MED ORDER — POTASSIUM CHLORIDE CRYS ER 20 MEQ PO TBCR
40.0000 meq | EXTENDED_RELEASE_TABLET | Freq: Once | ORAL | Status: AC
  Administered 2023-11-23: 40 meq via ORAL
  Filled 2023-11-23: qty 2

## 2023-11-23 NOTE — Discharge Instructions (Signed)
 Return to the emergency department if your symptoms worsen.  Follow-up with your primary care provider for removal of your stitches as directed by urgent care.

## 2023-11-23 NOTE — ED Provider Notes (Signed)
 [place] EMERGENCY DEPARTMENT AT [facility] Provider Note   CSN: [id number] Arrival date & time: 11/23/23  1150     Patient presents with: Susan Mcneil is a 78 y.o. female.   78 year old female presenting after a fall.  Patient is accompanied by her husband who provides collateral information.  Patient has history of dementia and is a poor historian, husband states he found her on the ground next to the bed around 8 AM today, he believes that she may have fallen out of bed and struck her face on the nightstand/floor, this event was unwitnessed and he does not know how long she was down.  She did have a laceration to her right eyebrow but this was repaired at urgent care this morning, urgent care encouraged the patient and her husband to come to the emergency department for further evaluation with a head CT.  She is not on blood thinners.   Fall       Prior to Admission medications   Medication Sig Start Date End Date Taking? Authorizing Provider  levothyroxine  (SYNTHROID ) 75 MCG tablet TAKE 1 TABLET BY MOUTH EVERY DAY MONDAY THROUGH SATURDAY AND 1&1/2 TABLETS DAILY ON SUNDAY 11/21/23   Alphonsa Glendia LABOR, MD  OVER THE COUNTER MEDICATION Vitamins A, B12, D, E , Omega Ultimum for over all health    [provider]  triamcinolone  cream (KENALOG ) 0.1 % Apply thin amount bid prn to rash 06/29/21   Alphonsa Glendia LABOR, MD    Allergies: Patient has no known allergies.    Review of Systems  Updated Vital Signs BP 101/77 (BP Location: Right Arm)   Pulse 60   Temp 97.8 F (36.6 C) (Oral)   Resp 17   SpO2 99%    Physical Exam Vitals and nursing note reviewed.  HENT:     Head:      Comments: Laceration to right eyebrow that has been repaired with 2 nonabsorbable sutures Contusion to forehead, bruising noted medial to medial canthus of R eye/bridge of nose Eyes:     Extraocular Movements: Extraocular movements intact.     Pupils: Pupils are equal, round,  and reactive to light.  Cardiovascular:     Rate and Rhythm: Normal rate and regular rhythm.  Pulmonary:     Effort: Pulmonary effort is normal.     Breath sounds: Normal breath sounds.  Abdominal:     Palpations: Abdomen is soft.     Tenderness: There is no abdominal tenderness. There is no guarding.  Musculoskeletal:     Cervical back: Normal range of motion. No tenderness.     Comments: Moves all extremities spontaneously without difficulty  Skin:    General: Skin is warm and dry.  Neurological:     Mental Status: She is alert. Mental status is at baseline.     (all labs ordered are listed, but only abnormal results are displayed) Labs Reviewed  BASIC METABOLIC PANEL WITH GFR - Abnormal; Notable for the following components:      Result Value   Potassium 3.1 (*)    All other components within normal limits  CBC  CK    EKG: None  Radiology: CT Head Wo Contrast Result Date: 11/23/2023 CLINICAL DATA:  Fall, eyebrow laceration, contusion, dementia EXAM: CT HEAD WITHOUT CONTRAST CT CERVICAL SPINE WITHOUT CONTRAST TECHNIQUE: Multidetector CT imaging of the head and cervical spine was performed following the standard protocol without intravenous contrast. Multiplanar CT image reconstructions of the cervical  spine were also generated. RADIATION DOSE REDUCTION: This exam was performed according to the departmental dose-optimization program which includes automated exposure control, adjustment of the mA and/or kV according to patient size and/or use of iterative reconstruction technique. COMPARISON:  MR brain, 12/27/2017 FINDINGS: CT HEAD FINDINGS Brain: No evidence of acute infarction, hemorrhage, hydrocephalus, extra-axial collection or mass lesion/mass effect. Periventricular and deep white matter hypodensity. Global cerebral volume loss. Vascular: No hyperdense vessel or unexpected calcification. Skull: Normal. Negative for fracture or focal lesion. Sinuses/Orbits: No acute finding.  Other: None. CT CERVICAL SPINE FINDINGS Alignment: Degenerative straightening of the normal cervical lordosis. Skull base and vertebrae: No acute fracture. No primary bone lesion or focal pathologic process. Soft tissues and spinal canal: No prevertebral fluid or swelling. No visible canal hematoma. Disc levels: Moderate multilevel cervical disc degenerative disease. Upper chest: Negative. Other: None. IMPRESSION: 1. No acute intracranial pathology. Small-vessel white matter disease and global cerebral volume loss. 2. No fracture or static subluxation of the cervical spine. 3. Moderate multilevel cervical disc degenerative disease. Electronically Signed   By: Marolyn JONETTA Jaksch M.D.   On: 11/23/2023 15:39   CT Cervical Spine Wo Contrast Result Date: 11/23/2023 CLINICAL DATA:  Fall, eyebrow laceration, contusion, dementia EXAM: CT HEAD WITHOUT CONTRAST CT CERVICAL SPINE WITHOUT CONTRAST TECHNIQUE: Multidetector CT imaging of the head and cervical spine was performed following the standard protocol without intravenous contrast. Multiplanar CT image reconstructions of the cervical spine were also generated. RADIATION DOSE REDUCTION: This exam was performed according to the departmental dose-optimization program which includes automated exposure control, adjustment of the mA and/or kV according to patient size and/or use of iterative reconstruction technique. COMPARISON:  MR brain, 12/27/2017 FINDINGS: CT HEAD FINDINGS Brain: No evidence of acute infarction, hemorrhage, hydrocephalus, extra-axial collection or mass lesion/mass effect. Periventricular and deep white matter hypodensity. Global cerebral volume loss. Vascular: No hyperdense vessel or unexpected calcification. Skull: Normal. Negative for fracture or focal lesion. Sinuses/Orbits: No acute finding. Other: None. CT CERVICAL SPINE FINDINGS Alignment: Degenerative straightening of the normal cervical lordosis. Skull base and vertebrae: No acute fracture. No primary  bone lesion or focal pathologic process. Soft tissues and spinal canal: No prevertebral fluid or swelling. No visible canal hematoma. Disc levels: Moderate multilevel cervical disc degenerative disease. Upper chest: Negative. Other: None. IMPRESSION: 1. No acute intracranial pathology. Small-vessel white matter disease and global cerebral volume loss. 2. No fracture or static subluxation of the cervical spine. 3. Moderate multilevel cervical disc degenerative disease. Electronically Signed   By: Marolyn JONETTA Jaksch M.D.   On: 11/23/2023 15:39   DG Pelvis Portable Result Date: 11/23/2023 CLINICAL DATA:  Fall EXAM: PORTABLE PELVIS 1-2 VIEWS COMPARISON:  None Available. FINDINGS: There is no evidence of pelvic fracture or diastasis. No pelvic bone lesions are seen. IMPRESSION: Negative. Electronically Signed   By: Greig Pique M.D.   On: 11/23/2023 15:17   DG Chest Portable 1 View Result Date: 11/23/2023 CLINICAL DATA:  Fall EXAM: PORTABLE CHEST 1 VIEW COMPARISON:  Chest x-ray 03/13/2017 FINDINGS: The heart size and mediastinal contours are within normal limits. Both lungs are clear. The visualized skeletal structures are unremarkable. IMPRESSION: No active disease. Electronically Signed   By: Greig Pique M.D.   On: 11/23/2023 15:17     Procedures   Medications Ordered in the ED  potassium chloride  SA (KLOR-CON  M) CR tablet 40 mEq (has no administration in time range)  Medical Decision Making This patient presents to the ED for concern of fall, this involves an extensive number of treatment options, and is a complaint that carries with it a high risk of complications and morbidity.  The differential diagnosis includes contusion, fracture, dislocation, intracranial hemorrhage.   Co morbidities that complicate the patient evaluation  Alzheimer's, hypothyroidism   Additional history obtained:  Additional history obtained from record review External records from  outside source obtained and reviewed including recent PCP note   Lab Tests:  I Ordered, and personally interpreted labs.  The pertinent results include: CBC within normal limits, BMP notable for hypokalemia with a potassium of 3.1, CK within normal limits.   Imaging Studies ordered:  I ordered imaging studies including head CT, C-spine CT, chest XR, pelvis XR  I independently visualized and interpreted imaging which showed  - head and C-spine CT: 1. No acute intracranial pathology. Small-vessel white matter disease and global cerebral volume loss. 2. No fracture or static subluxation of the cervical spine. 3. Moderate multilevel cervical disc degenerative disease. - chest XR: no active disease - pelvis XR: negative I agree with the radiologist interpretation   Cardiac Monitoring: / EKG:  The patient was maintained on a cardiac monitor.  I personally viewed and interpreted the cardiac monitored which showed an underlying rhythm of: NSR   Problem List / ED Course / Critical interventions / Medication management  I ordered medication including PO potassium  for hypokalemia  I have reviewed the patients home medicines and have made adjustments as needed   Social Determinants of Health:  Former tobacco use, depression   Test / Admission - Considered:  Physical exam notable as above.  Labs/imaging are largely reassuring, patient was mildly hypokalemic but was given p.o. potassium supplementation in the emergency department this evening.  I discussed these findings with her husband, I encouraged him to follow-up with her primary care provider for removal of her sutures that were done at urgent care, he voiced understanding.  The patient/her husband voiced understanding and are in agreement this plan, they are appropriate for discharge at this time.  Strict return precautions discussed.    Amount and/or Complexity of Data Reviewed Labs: ordered. Radiology:  ordered.  Risk Prescription drug management.       Final diagnoses:  Fall, initial encounter    ED Discharge Orders     None          Glendia Rocky LOISE DEVONNA 11/23/23 1601    Cleotilde Rogue, MD 11/24/23 [phone]

## 2023-11-23 NOTE — ED Triage Notes (Addendum)
 Pt here with husband. Pt has dementia and alert to person only. Ambulatory. Seen at UC due to husband found pt on floor this am. Advised to come her for head CT. Pt has bruising and dried blood noted above right eye. Pupils perrla. Moving all extremities. Ambulatory. Palpated over body with no grimacing or ss of pain noted. UC put stitches to right eye pta

## 2023-11-23 NOTE — Telephone Encounter (Signed)
 Called and spoke with daughter and she states patient was taken to urgent care for evaluation.

## 2023-11-23 NOTE — Telephone Encounter (Signed)
 FYI Only or Action Required?: FYI only for provider.  Patient was last seen in primary care on 11/15/2023 by Alphonsa Glendia LABOR, MD.  Called Nurse Triage reporting Fall.  Symptoms began today.  Interventions attempted: Nothing.  Symptoms are: unchanged.  Triage Disposition: Go to ED Now (or PCP Triage)  Patient/caregiver understands and will follow disposition?: Yes, will follow disposition  Copied from CRM [id number]. Topic: Clinical - Red Word Triage >> Nov 23, 2023 10:00 AM Sasha H wrote: [facility] that prompted transfer to Nurse Triage: Pt fell overnight and has a gash above eyebrow . Pt's daughter Katheryn is the one calling in states her father asked her to call Reason for Disposition  [1] Unable to get up until help (e.g., caregiver, family, friend) arrived AND [2] on the ground 1 hour or more  Answer Assessment - Initial Assessment Questions 1. MECHANISM: How did the fall happen?     Unknown, happened during night, daughter is calling 2. DOMESTIC VIOLENCE AND ELDER ABUSE SCREENING: Did you fall because someone pushed you or tried to hurt you? If Yes, ask: Are you safe now?     denies 3. ONSET: When did the fall happen? (e.g., minutes, hours, or days ago)     Last evening 4. LOCATION: What part of the body hit the ground? (e.g., back, buttocks, head, hips, knees, hands, head, stomach)     Head,  5. INJURY: Did you hurt (injure) yourself when you fell? If Yes, ask: What did you injure? Tell me more about this? (e.g., body area; type of injury; pain severity)     Lac to head 6. PAIN: Is there any pain? If Yes, ask: How bad is the pain? (e.g., Scale 0-10; or none, mild,      Husband reports to daughter that pt seems fine 7. SIZE: For cuts, bruises, or swelling, ask: How large is it? (e.g., inches or centimeters)      Roughly an inch long 9. OTHER SYMPTOMS: Do you have any other symptoms? (e.g., dizziness, fever, weakness; new-onset or worsening).      denies 10.  CAUSE: What do you think caused the fall (or falling)? (e.g., dizzy spell, tripped)       Unsure, daughter believes that pt was trying to get out of bed  Protocols used: Falls and [facility]

## 2023-12-25 ENCOUNTER — Telehealth: Payer: Self-pay | Admitting: *Deleted

## 2023-12-25 NOTE — Telephone Encounter (Signed)
 Copied from CRM [id number]. Topic: Clinical - Medical Advice >> Dec 25, 2023 10:24 AM Susan Mcneil wrote: Reason for CRM: [facility] Phone: [phone]  Patient was recently admitted and family reported that the patient is having behavioral issues while trying to performe care for the patient. She would become combative. Would like to discuss getting patient on medication. Please discuss with Hospice.

## 2024-02-27 ENCOUNTER — Emergency Department (HOSPITAL_COMMUNITY)

## 2024-02-27 ENCOUNTER — Other Ambulatory Visit: Payer: Self-pay

## 2024-02-27 ENCOUNTER — Emergency Department (HOSPITAL_COMMUNITY)
Admission: EM | Admit: 2024-02-27 | Discharge: 2024-02-27 | Disposition: A | Discharge status: Home / Self Care | Attending: Emergency Medicine | Admitting: Emergency Medicine

## 2024-02-27 DIAGNOSIS — S0103XA Puncture wound without foreign body of scalp, initial encounter: Secondary | ICD-10-CM | POA: Insufficient documentation

## 2024-02-27 DIAGNOSIS — Y92002 Bathroom of unspecified non-institutional (private) residence single-family (private) house as the place of occurrence of the external cause: Secondary | ICD-10-CM | POA: Insufficient documentation

## 2024-02-27 DIAGNOSIS — W01198A Fall on same level from slipping, tripping and stumbling with subsequent striking against other object, initial encounter: Secondary | ICD-10-CM | POA: Insufficient documentation

## 2024-02-27 DIAGNOSIS — W19XXXA Unspecified fall, initial encounter: Secondary | ICD-10-CM

## 2024-02-27 DIAGNOSIS — N390 Urinary tract infection, site not specified: Secondary | ICD-10-CM | POA: Insufficient documentation

## 2024-02-27 LAB — URINALYSIS, W/ REFLEX TO CULTURE (INFECTION SUSPECTED)
Bilirubin Urine: NEGATIVE
Glucose, UA: NEGATIVE mg/dL
Hgb urine dipstick: NEGATIVE
Ketones, ur: NEGATIVE mg/dL
Nitrite: NEGATIVE
Protein, ur: NEGATIVE mg/dL
Specific Gravity, Urine: 1.016 (ref 1.005–1.030)
pH: 6 (ref 5.0–8.0)

## 2024-02-27 LAB — CBC
HCT: 40.2 % (ref 36.0–46.0)
Hemoglobin: 13.2 g/dL (ref 12.0–15.0)
MCH: 30.5 pg (ref 26.0–34.0)
MCHC: 32.8 g/dL (ref 30.0–36.0)
MCV: 92.8 fL (ref 80.0–100.0)
Platelets: 225 K/uL (ref 150–400)
RBC: 4.33 MIL/uL (ref 3.87–5.11)
RDW: 14.3 % (ref 11.5–15.5)
WBC: 5.1 K/uL (ref 4.0–10.5)
nRBC: 0 % (ref 0.0–0.2)

## 2024-02-27 LAB — COMPREHENSIVE METABOLIC PANEL WITH GFR
ALT: 9 U/L (ref 0–44)
AST: 21 U/L (ref 15–41)
Albumin: 3.7 g/dL (ref 3.5–5.0)
Alkaline Phosphatase: 58 U/L (ref 38–126)
Anion gap: 11 (ref 5–15)
BUN: 19 mg/dL (ref 8–23)
CO2: 24 mmol/L (ref 22–32)
Calcium: 9 mg/dL (ref 8.9–10.3)
Chloride: 108 mmol/L (ref 98–111)
Creatinine, Ser: 0.64 mg/dL (ref 0.44–1.00)
GFR, Estimated: >60 mL/min (ref >60)
Glucose, Bld: 117 mg/dL — ABNORMAL HIGH (ref 70–99)
Potassium: 4.1 mmol/L (ref 3.5–5.1)
Sodium: 143 mmol/L (ref 135–145)
Total Bilirubin: 0.5 mg/dL (ref 0.0–1.2)
Total Protein: 6.2 g/dL — ABNORMAL LOW (ref 6.5–8.1)

## 2024-02-27 LAB — LACTIC ACID, PLASMA: Lactic Acid, Venous: 1.7 mmol/L (ref 0.5–1.9)

## 2024-02-27 LAB — CBG MONITORING, ED: Glucose-Capillary: 100 mg/dL — ABNORMAL HIGH (ref 70–99)

## 2024-02-27 LAB — SAMPLE TO BLOOD BANK

## 2024-02-27 LAB — PROTIME-INR
INR: 1 (ref 0.8–1.2)
Prothrombin Time: 13.2 s (ref 11.4–15.2)

## 2024-02-27 LAB — ETHANOL: Alcohol, Ethyl (B): <15 mg/dL (ref <15)

## 2024-02-27 MED ORDER — CEPHALEXIN 500 MG PO CAPS
500.0000 mg | ORAL_CAPSULE | Freq: Four times a day (QID) | ORAL | 0 refills | Status: AC
Start: 2024-02-27 — End: ?

## 2024-02-27 MED ORDER — HYDROCODONE-ACETAMINOPHEN 5-325 MG PO TABS: 1.0000 | ORAL_TABLET | Freq: Once | ORAL | Status: DC

## 2024-02-27 MED ORDER — CEPHALEXIN 500 MG PO CAPS
500.0000 mg | ORAL_CAPSULE | Freq: Once | ORAL | Status: AC
  Administered 2024-02-27: 500 mg via ORAL
  Filled 2024-02-27: qty 1

## 2024-02-27 NOTE — ED Notes (Signed)
 Wound care, education, and take home supplies provided.

## 2024-02-27 NOTE — ED Provider Notes (Signed)
 [place] EMERGENCY DEPARTMENT AT [facility] Provider Note   CSN: [id number] Arrival date & time: 02/27/24  9098     Patient presents with: Fall and Loss of Consciousness  HPI Susan Mcneil is a 78 y.o. female with Alzheimer's dementia, hypertension, hypothyroidism, hyperlipidemia presenting for fall.  Her husband reports that she was in the bathroom putting on her pants when she lifted her leg trying and fell backwards hitting the back of her head on the close hamper.  Husband denies use of blood thinners.  He and his daughter also report that she can be nonverbal at times.  Patient found to have odorous urine here in triage.  EMS also concerned that she may be in A-fib.  Unsure if this is new.  Husband denies LOC.  Does report that she may have an injury to the back of her head and an abrasion to the left forearm.    Fall  Loss of Consciousness      Prior to Admission medications   Medication Sig Start Date End Date Taking? Authorizing Provider  cephALEXin  (KEFLEX ) 500 MG capsule Take 1 capsule (500 mg total) by mouth 4 (four) times daily. 02/27/24  Yes Carolene Gitto K, PA-C  levothyroxine  (SYNTHROID ) 75 MCG tablet TAKE 1 TABLET BY MOUTH EVERY DAY MONDAY THROUGH SATURDAY AND 1&1/2 TABLETS DAILY ON SUNDAY Patient taking differently: Take 75 mcg by mouth daily before breakfast. 11/21/23   Alphonsa Glendia LABOR, MD    Allergies: Patient has no known allergies.    Review of Systems  Cardiovascular:  Positive for syncope.    Physical Exam   Vitals:   02/27/24 1230 02/27/24 1345  BP: 120/60 (!) 131/58  Pulse: (!) 59 65  Resp: 17 (!) 21  Temp:    SpO2: 98% 94%    CONSTITUTIONAL:  well-appearing, NAD, noncommunicative NEURO: Confused, noncommunicative, moving all extremities unable to follow simple commands, responsive to stimuli EYES:  eyes equal and reactive Head: 2 tiny puncture wounds to superior scalp ENT/NECK:  Supple, no stridor  Chest: atraumatic CARDIO:   regular rate and rhythm, appears well-perfused  PULM:  No respiratory distress, CTAB GI/GU:  non-distended, soft, non tender MSK/SPINE:  No gross deformities, no edema, moves all extremities, skin tear to left forearm SKIN:  no rash, atraumatic  *Additional and/or pertinent findings included in MDM below    (all labs ordered are listed, but only abnormal results are displayed) Labs Reviewed  URINALYSIS, W/ REFLEX TO CULTURE (INFECTION SUSPECTED) - Abnormal; Notable for the following components:      Result Value   APPearance HAZY (*)    Leukocytes,Ua TRACE (*)    Bacteria, UA MANY (*)    All other components within normal limits  COMPREHENSIVE METABOLIC PANEL WITH GFR - Abnormal; Notable for the following components:   Glucose, Bld 117 (*)    Total Protein 6.2 (*)    All other components within normal limits  CBG MONITORING, ED - Abnormal; Notable for the following components:   Glucose-Capillary 100 (*)    All other components within normal limits  CBC  ETHANOL  LACTIC ACID, PLASMA  PROTIME-INR  I-STAT CHEM 8, ED  SAMPLE TO BLOOD BANK    EKG: None  Radiology: CT CERVICAL SPINE WO CONTRAST Result Date: 02/27/2024 EXAM: CT CERVICAL SPINE WITHOUT CONTRAST 02/27/2024 12:35:37 PM TECHNIQUE: CT of the cervical spine was performed without the administration of intravenous contrast. Multiplanar reformatted images are provided for review. Automated exposure control, iterative reconstruction, and/or  weight based adjustment of the mA/kV was utilized to reduce the radiation dose to as low as reasonably achievable. COMPARISON: CT cervical spine 11/23/2023 CLINICAL HISTORY: Polytrauma, blunt. Head pain with dizziness. FINDINGS: CERVICAL SPINE: BONES AND ALIGNMENT: Chronic straightening of the normal cervical lordosis. No significant listhesis. No acute fracture or suspicious lesion. DEGENERATIVE CHANGES: Advanced disc degeneration from C3-C4 through C7-T1. Solid osseous fusion across the  disc space at C6-C7. Moderate right facet arthrosis at C3-C4 and C4-C5. No evidence of high grade spinal canal stenosis. Moderate right greater than left neural foraminal stenosis at C5-C6. SOFT TISSUES: No prevertebral soft tissue swelling. IMPRESSION: 1. No acute cervical spine fracture no traumatic malalignment. Electronically signed by: Dasie Hamburg MD 02/27/2024 12:43 PM EDT RP Workstation: [id number]   CT HEAD WO CONTRAST Result Date: 02/27/2024 EXAM: CT HEAD WITHOUT CONTRAST 02/27/2024 12:35:37 PM TECHNIQUE: CT of the head was performed without the administration of intravenous contrast. Automated exposure control, iterative reconstruction, and/or weight based adjustment of the mA/kV was utilized to reduce the radiation dose to as low as reasonably achievable. COMPARISON: 11/23/2023 CLINICAL HISTORY: Head trauma, moderate-severe. Head pain with dizziness. FINDINGS: BRAIN AND VENTRICLES: No acute hemorrhage. No evidence of acute infarct. No hydrocephalus. No extra-axial collection. No mass effect or midline shift. Age-related cerebral atrophy. Periventricular white matter changes, likely sequela of chronic small vessel ischemic disease. Atherosclerotic calcifications in intracranial carotid and vertebral arteries. ORBITS: No acute abnormality. Status post bilateral lens replacements. SINUSES: No acute abnormality. SOFT TISSUES AND SKULL: Soft tissue swelling of left frontal scalp. No skull fracture. IMPRESSION: 1. No acute intracranial abnormality related to head trauma. 2. Soft tissue swelling of left frontal scalp. 3. Age-related cerebral atrophy. 4. Chronic small vessel ischemic disease. Electronically signed by: Donnice Mania MD 02/27/2024 12:40 PM EDT RP Workstation: [id number]   DG Pelvis Portable Result Date: 02/27/2024 CLINICAL DATA:  [place]. EXAM: PORTABLE PELVIS 1-2 VIEWS COMPARISON:  10/24/2023 FINDINGS: Diffuse osteopenia. No fracture or dislocation. Lower lumbar spine degenerative changes.  IMPRESSION: No fracture. Electronically Signed   By: Elspeth Bathe M.D.   On: 02/27/2024 10:27   DG Chest Port 1 View Result Date: 02/27/2024 CLINICAL DATA:  [place]. EXAM: PORTABLE CHEST 1 VIEW COMPARISON:  11/23/2023 FINDINGS: Normal-sized heart. Tortuous and partially calcified thoracic aorta. Clear lungs with normal vascularity. Stable mild chronic interstitial prominence. Diffuse osteopenia. No fracture or pneumothorax seen. IMPRESSION: No acute abnormality. Electronically Signed   By: Elspeth Bathe M.D.   On: 02/27/2024 10:26     Procedures   Medications Ordered in the ED  HYDROcodone-acetaminophen  (NORCO/VICODIN) 5-325 MG per tablet 1 tablet (1 tablet Oral Patient Refused/Not Given 02/27/24 1308)  cephALEXin  (KEFLEX ) capsule 500 mg (has no administration in time range)                                    Medical Decision Making Amount and/or Complexity of Data Reviewed Labs: ordered. Radiology: ordered.  Risk Prescription drug management.   78 year old well-appearing female presents for fall.  Exam notable for tiny puncture wounds to the superior scalp, confusion and left forearm skin tear.  DDx includes fractures, internal bleeding, concussion, TBI, DAI, pneumothorax, hemorrhage, spinal cord injury.   Pelvic x-ray, chest x-ray, CT Noncon of her head and cervical spine were all nonacute.  Urinalysis concerning for UTI.  Started on Keflex .  Other labs were on her markable.  Irrigated and cleaned her tiny puncture wounds on  superior scalp.  Applied nonadherent dressing.  Ambulated multiple times, department with no issue.  Was told by nursing staff that she did great with ambulation.  Sent Keflex  to her pharmacy.  Advised her to follow-up with her PCP.  Also on reassessment, her daughter and husband assured me that her level of mentation was normal.  Discussed return precautions.  Discharged.  Also recommended wound care follow-up for the skin tear to the left forearm.      Final  diagnoses:  Fall, initial encounter  Urinary tract infection without hematuria, site unspecified    ED Discharge Orders          Ordered    cephALEXin  (KEFLEX ) 500 MG capsule  4 times daily        02/27/24 1431               Ahad Colarusso K, PA-C 02/27/24 1432    Suzette Pac, MD 02/27/24 1552

## 2024-02-27 NOTE — Discharge Instructions (Addendum)
 Evaluation today for your fall was overall reassuring.  Your x-rays and CT scans were negative for acute injury.  You did have a tiny puncture wounds to the top of your head but did not require stapling or stitches.  Would recommend that you clean them daily with soap and water  and you can apply gauze.  Also urinalysis was concerning for UTI.  Starting her on Keflex .  For the skin tear to her left forearm recommend that she follow-up with wound care.

## 2024-02-27 NOTE — ED Notes (Signed)
 Pt did well with ambulation, 0 to minimum assistance.

## 2024-02-27 NOTE — ED Triage Notes (Signed)
 Pt bib [facility] after a fall. Husband reports she was in the bathroom and fell backwards hitting her head on the clothes hamper. Pt is NOT on blood thinners.   EMS reports pt was alert but unresponsive. In truck pt began to become more responsive. Ems does not believe this pt is verbal at baseline. Pt has odor of an UTI in triage. VSS on EMS truck. EMS reports pt is in AFIB but does not know if this is a new finding. Pt husband was a poor historian.

## 2024-02-28 LAB — CBG MONITORING, ED: Glucose-Capillary: 100 mg/dL — ABNORMAL HIGH (ref 70–99)

## 2024-03-01 ENCOUNTER — Ambulatory Visit (INDEPENDENT_AMBULATORY_CARE_PROVIDER_SITE_OTHER): Payer: Medicare PPO

## 2024-03-01 VITALS — Ht 65.0 in | Wt 118.0 lb

## 2024-03-01 DIAGNOSIS — Z0000 Encounter for general adult medical examination without abnormal findings: Secondary | ICD-10-CM

## 2024-03-01 NOTE — Progress Notes (Signed)
 Subjective:   Susan Mcneil is a 78 y.o. who presents for a Medicare Wellness preventive visit.  As a reminder, Annual Wellness Visits don't include a physical exam, and some assessments may be limited, especially if this visit is performed virtually. We may recommend an in-person follow-up visit with your provider if needed.  Visit Complete: Virtual I connected with  Susan Mcneil on 03/01/24 by a audio enabled telemedicine application and verified that I am speaking with the correct person using two identifiers.  Patient Location: Home  Provider Location: Home Office  I discussed the limitations of evaluation and management by telemedicine. The patient expressed understanding and agreed to proceed.  Vital Signs: Because this visit was a virtual/telehealth visit, some criteria may be missing or patient reported. Any vitals not documented were not able to be obtained and vitals that have been documented are patient reported.  VideoDeclined- This patient declined Librarian, academic. Therefore the visit was completed with audio only.  Persons Participating in Visit: Patient assisted by daughter Katheryn.  AWV Questionnaire: No: Patient Medicare AWV questionnaire was not completed prior to this visit.  Cardiac Risk Factors include: advanced age (>9men, >10 women);dyslipidemia;hypertension     Objective:    Today's Vitals   03/01/24 1407  Weight: 118 lb (53.5 kg)  Height: 5' 5 (1.651 m)   Body mass index is 19.64 kg/m.     03/01/2024    2:42 PM 02/27/2024    9:13 AM 11/23/2023    2:01 PM 02/24/2023    3:00 PM 05/03/2022    5:23 PM 01/17/2018    8:27 AM 10/27/2016   12:09 PM  Advanced Directives  Does Patient Have a Medical Advance Directive? No;Yes Unable to assess, patient is non-responsive or altered mental status No No Yes No  No   Type of Advance Directive Out of facility DNR (pink MOST or yellow form)    Healthcare Power of [place];Living will     Does patient want to make changes to medical advance directive?     No - Guardian declined    Copy of Healthcare Power of Attorney in Chart?     No - copy requested    Would patient like information on creating a medical advance directive? Yes (MAU/Ambulatory/Procedural Areas - Information given)  No - Guardian declined Yes (MAU/Ambulatory/Procedural Areas - Information given)  Yes (MAU/Ambulatory/Procedural Areas - Information given)  No - Patient declined   Pre-existing out of facility DNR order (yellow form or pink MOST form) Yellow form placed in chart (order not valid for inpatient use)           Data saved with a previous flowsheet row definition    Current Medications (verified) Outpatient Encounter Medications as of 03/01/2024  Medication Sig   cephALEXin  (KEFLEX ) 500 MG capsule Take 1 capsule (500 mg total) by mouth 4 (four) times daily.   levothyroxine  (SYNTHROID ) 75 MCG tablet TAKE 1 TABLET BY MOUTH EVERY DAY MONDAY THROUGH SATURDAY AND 1&1/2 TABLETS DAILY ON SUNDAY (Patient taking differently: Take 75 mcg by mouth daily before breakfast.)   No facility-administered encounter medications on file as of 03/01/2024.    Allergies (verified) Patient has no known allergies.   History: Past Medical History:  Diagnosis Date   Alzheimer's dementia (HCC) 11/21/2019   Angiomyolipoma of left kidney 06/14/2017   Followed by [facility], most recent testing shows it is stable, they recommending follow-up in 2 years, June 14, 2017-follow-up will be in 2021  Fatty liver    HTN (hypertension)    cholesterol & thyroid    Hypothyroidism    Kidney stone    Macular degeneration    Macular degeneration, bilateral    Memory disturbance    Neovascular glaucoma, right eye, severe stage 09/04/2019   Osteopenia    Osteoporosis    Reflux gastritis    Thyroid  disorder    Past Surgical History:  Procedure Laterality Date   ABDOMINAL HYSTERECTOMY   patial, pre-uterine cancer, 1974    BIOPSY  10/27/2016   Procedure: BIOPSY;  Surgeon: Golda Claudis PENNER, MD;  [facility];  Service: Endoscopy;;  esophagus   BIOPSY  01/17/2018   Procedure: BIOPSY;  Surgeon: Golda Claudis PENNER, MD;  [facility];  Service: Endoscopy;;  colon   COLONOSCOPY N/A 04/01/2015   Procedure: COLONOSCOPY;  Surgeon: Claudis PENNER Golda, MD;  [facility];  Service: Endoscopy;  Laterality: N/A;  1030   COLONOSCOPY N/A 01/17/2018   Procedure: COLONOSCOPY;  Surgeon: Golda Claudis PENNER, MD;  [facility];  Service: Endoscopy;  Laterality: N/A;  9:30   DIAGNOSTIC LAPAROSCOPY     ESOPHAGOGASTRODUODENOSCOPY N/A 10/27/2016   Procedure: ESOPHAGOGASTRODUODENOSCOPY (EGD);  Surgeon: Golda Claudis PENNER, MD;  [facility];  Service: Endoscopy;  Laterality: N/A;  100   TOTAL ABDOMINAL HYSTERECTOMY W/ BILATERAL SALPINGOOPHORECTOMY     Family History  Problem Relation Age of Onset   Cancer Mother    Stroke Father    Social History   Socioeconomic History   Marital status: Married    Spouse name: Everline Mahaffy   Number of children: Not on file   Years of education: 12   Highest education level: 12th grade  Occupational History   Occupation: retired    Associate Professor: RETIRED  Tobacco Use   Smoking status: Former    Current packs/day: 0.00    Types: Cigarettes    Quit date: 12/07/2000    Years since quitting: 23.2    Passive exposure: Past   Smokeless tobacco: Never   Tobacco comments:    Verified by Husband - Amelie Breen  Vaping Use   Vaping status: Never Used  Substance and Sexual Activity   Alcohol use: No   Drug use: No   Sexual activity: Not Currently    Partners: Male  Other Topics Concern   Not on file  Social History Narrative   Not on file   Social Drivers of Health   Financial Resource Strain: Low Risk  (03/01/2024)   Overall Financial Resource Strain (CARDIA)    Difficulty of Paying Living Expenses: Not hard at all  Food Insecurity: No Food Insecurity  (03/01/2024)   Hunger Vital Sign    Worried About Running Out of Food in the Last Year: Never true    Ran Out of Food in the Last Year: Never true  Transportation Needs: No Transportation Needs (03/01/2024)   PRAPARE - Administrator, Civil Service (Medical): No    Lack of Transportation (Non-Medical): No  Physical Activity: Inactive (03/01/2024)   Exercise Vital Sign    Days of Exercise per Week: 0 days    Minutes of Exercise per Session: 0 min  Stress: No Stress Concern Present (03/01/2024)   [employer] of Occupational Health - Occupational Stress Questionnaire    Feeling of Stress: Not at all  Social Connections: Moderately Isolated (03/01/2024)   Social Connection and Isolation Panel    Frequency of Communication with Friends and  Family: Never    Frequency of Social Gatherings with Friends and Family: More than three times a week    Attends Religious Services: Never    Database administrator or Organizations: No    Attends Engineer, structural: Never    Marital Status: Married    Tobacco Counseling Counseling given: Not Answered Tobacco comments: Verified by Husband GL[place] Amelie Breen    Clinical Intake:  Pre-visit preparation completed: Yes  Pain : No/denies pain  Diabetes: No  How often do you need to have someone help you when you read instructions, pamphlets, or other written materials from your doctor or pharmacy?: 1 - Never  Interpreter Needed?: No  Information entered by :: Charmaine Bloodgood LPN   Activities of Daily Living     03/01/2024    2:41 PM  In your present state of health, do you have any difficulty performing the following activities:  Hearing? 0  Vision? 0  Difficulty concentrating or making decisions? 1  Walking or climbing stairs? 1  Dressing or bathing? 1  Doing errands, shopping? 1  Preparing Food and eating ? Y  Using the Toilet? Y  In the past six months, have you accidently leaked urine? Y  Do you have  problems with loss of bowel control? N  Managing your Medications? Y  Managing your Finances? Y  Housekeeping or managing your Housekeeping? Y    Patient Care Team: Alphonsa Glendia LABOR, MD as PCP - General Darroll Anes, DO (Optometry)  I have updated your Care Teams any recent Medical Services you may have received from other providers in the past year.     Assessment:   This is a routine wellness examination for Susan Mcneil.  Hearing/Vision screen Hearing Screening - Comments:: Patient is able to hear conversational tones without difficulty. No issues reported.   Vision Screening - Comments:: Wears rx glasses - up to date with routine eye exams with [facility] [place]    Goals Addressed             This Visit's Progress    Prevent falls   Not on track      Depression Screen     03/01/2024    2:22 PM 11/15/2023   10:42 AM 08/24/2023   11:13 AM 04/19/2023    1:30 PM 02/24/2023    3:05 PM 01/18/2023    1:20 PM 06/14/2022   11:44 AM  PHQ 2/9 Scores  PHQ - 2 Score 5 5 4 5  0 5 4  PHQ- 9 Score 14 18 16 20  0 21 12    Fall Risk     03/01/2024    2:40 PM 11/15/2023   10:42 AM 08/24/2023   11:13 AM 02/24/2023    3:09 PM 01/18/2023    1:20 PM  Fall Risk   Falls in the past year? 1 0 0 0 0  Number falls in past yr: 0   0   Injury with Fall? 0   0   Risk for fall due to : History of fall(s);Impaired balance/gait;Impaired mobility   No Fall Risks   Follow up Education provided;Falls prevention discussed;Falls evaluation completed        MEDICARE RISK AT HOME:  Medicare Risk at Home Any stairs in or around the home?: No If so, are there any without handrails?: No Home free of loose throw rugs in walkways, pet beds, electrical cords, etc?: Yes Adequate lighting in your home to reduce risk of falls?: Yes Life alert?: No  Use of a cane, walker or w/c?: No Grab bars in the bathroom?: Yes Shower chair or bench in shower?: Yes Elevated toilet seat or a handicapped toilet?: Yes  TIMED  UP AND GO:  Was the test performed?  No  Cognitive Function: Impaired: Patient has current diagnosis of cognitive impairment.    02/24/2023    3:05 PM  MMSE - Mini Mental State Exam  Not completed: Unable to complete      10/15/2019    3:18 PM 12/15/2017   10:01 AM  Montreal Cognitive Assessment   Visuospatial/ Executive (0/5) 0 3  Naming (0/3) 3 3  Attention: Read list of digits (0/2) 0 1  Attention: Read list of letters (0/1) 1 1  Attention: Serial 7 subtraction starting at 100 (0/3) 0 2  Language: Repeat phrase (0/2) 2 2  Language : Fluency (0/1) 0 0  Abstraction (0/2) 1 2  Delayed Recall (0/5) 0 0  Orientation (0/6) 5 3  Total 12 17  Adjusted Score (based on education) 12 18      Immunizations Immunization History  Administered Date(s) Administered   Fluad Quad(high Dose 65+) 02/27/2020, 01/25/2022   Fluad Trivalent(High Dose 65+) 01/18/2023   Influenza,inj,Quad PF,6+ Mos 03/10/2016, 02/17/2017, 07/26/2018   Influenza-Unspecified 02/20/2013, 02/12/2014   Moderna Sars-Covid-2 Vaccination 07/19/2019, 08/15/2019   Pneumococcal Conjugate-13 11/20/2013   Pneumococcal Polysaccharide-23 04/11/2016    Screening Tests Health Maintenance  Topic Date Due   Zoster Vaccines- Shingrix (1 of 2) Never done   Influenza Vaccine  12/08/2023   COVID-19 Vaccine (3 - Moderna risk series) 08/22/2024 (Originally 09/12/2019)   DTaP/Tdap/Td (1 - Tdap) 08/23/2024 (Originally 12/27/1964)   Medicare Annual Wellness (AWV)  03/01/2025   Pneumococcal Vaccine: 50+ Years  Completed   DEXA SCAN  Completed   Hepatitis C Screening  Completed   Meningococcal B Vaccine  Aged Out   Mammogram  Discontinued   Colonoscopy  Discontinued    Health Maintenance Items Addressed: Vaccines Due: Flu and Shingrix   Additional Screening:  Vision Screening: Recommended annual ophthalmology exams for early detection of glaucoma and other disorders of the eye. Is the patient up to date with their annual eye  exam?  Yes  Who is the provider or what is the name of the office in which the patient attends annual eye exams? [facility]   Dental Screening: Recommended annual dental exams for proper oral hygiene  Community Resource Referral / Chronic Care Management: CRR required this visit?  No   CCM required this visit?  No   Plan:    I have personally reviewed and noted the following in the patient's chart:   Medical and social history Use of alcohol, tobacco or illicit drugs  Current medications and supplements including opioid prescriptions. Patient is not currently taking opioid prescriptions. Functional ability and status Nutritional status Physical activity Advanced directives List of other physicians Hospitalizations, surgeries, and ER visits in previous 12 months Vitals Screenings to include cognitive, depression, and falls Referrals and appointments  In addition, I have reviewed and discussed with patient certain preventive protocols, quality metrics, and best practice recommendations. A written personalized care plan for preventive services as well as general preventive health recommendations were provided to patient.   Lavelle Pfeiffer [place], [place]   [id number]   After Visit Summary: (MyChart) Due to this being a telephonic visit, the after visit summary with patients personalized plan was offered to patient via MyChart   Notes: Nothing significant to report at this time.

## 2024-03-01 NOTE — Patient Instructions (Signed)
 Susan Mcneil,  Thank you for taking the time for your Medicare Wellness Visit. I appreciate your continued commitment to your health goals. Please review the care plan we discussed, and feel free to reach out if I can assist you further.  Medicare recommends these wellness visits once per year to help you and your care team stay ahead of potential health issues. These visits are designed to focus on prevention, allowing your provider to concentrate on managing your acute and chronic conditions during your regular appointments.  Please note that Annual Wellness Visits do not include a physical exam. Some assessments may be limited, especially if the visit was conducted virtually. If needed, we may recommend a separate in-person follow-up with your provider.  Ongoing Care Seeing your primary care provider every 3 to 6 months helps us  monitor your health and provide consistent, personalized care.   Referrals If a referral was made during today's visit and you haven't received any updates within two weeks, please contact the referred provider directly to check on the status.  Recommended Screenings:  Health Maintenance  Topic Date Due   Zoster (Shingles) Vaccine (1 of 2) Never done   Flu Shot  12/08/2023   COVID-19 Vaccine (3 - Moderna risk series) 08/22/2024*   DTaP/Tdap/Td vaccine (1 - Tdap) 08/23/2024*   Medicare Annual Wellness Visit  03/01/2025   Pneumococcal Vaccine for age over 68  Completed   DEXA scan (bone density measurement)  Completed   Hepatitis C Screening  Completed   Meningitis B Vaccine  Aged Out   Breast Cancer Screening  Discontinued   Colon Cancer Screening  Discontinued  *Topic was postponed. The date shown is not the original due date.       03/01/2024    2:42 PM  Advanced Directives  Does Patient Have a Medical Advance Directive? No;Yes  Type of Advance Directive Out of facility DNR (pink MOST or yellow form)  Would patient like information on creating a medical  advance directive? Yes (MAU/Ambulatory/Procedural Areas - Information given)  Pre-existing out of facility DNR order (yellow form or pink MOST form) Yellow form placed in chart (order not valid for inpatient use)   Advance Care Planning is important because it: Ensures you receive medical care that aligns with your values, goals, and preferences. Provides guidance to your family and loved ones, reducing the emotional burden of decision-making during critical moments.  Information on Advanced Care Planning can be found at   Secretary of [facility] Advance Health Care Directives Advance Health Care Directives ([email or web address]   Vision: Annual vision screenings are recommended for early detection of glaucoma, cataracts, and diabetic retinopathy. These exams can also reveal signs of chronic conditions such as diabetes and high blood pressure.  Dental: Annual dental screenings help detect early signs of oral cancer, gum disease, and other conditions linked to overall health, including heart disease and diabetes.  Please see the attached documents for additional preventive care recommendations.

## 2024-03-20 ENCOUNTER — Ambulatory Visit (INDEPENDENT_AMBULATORY_CARE_PROVIDER_SITE_OTHER): Admitting: Family Medicine

## 2024-03-20 ENCOUNTER — Encounter: Payer: Self-pay | Admitting: Family Medicine

## 2024-03-20 VITALS — BP 131/70 | HR 69 | Ht 65.0 in | Wt 107.0 lb

## 2024-03-20 DIAGNOSIS — Z23 Encounter for immunization: Secondary | ICD-10-CM | POA: Diagnosis not present

## 2024-03-20 DIAGNOSIS — G308 Other Alzheimer's disease: Secondary | ICD-10-CM | POA: Diagnosis not present

## 2024-03-20 DIAGNOSIS — E039 Hypothyroidism, unspecified: Secondary | ICD-10-CM

## 2024-03-20 DIAGNOSIS — F02C0 Dementia in other diseases classified elsewhere, severe, without behavioral disturbance, psychotic disturbance, mood disturbance, and anxiety: Secondary | ICD-10-CM

## 2024-03-20 NOTE — Progress Notes (Signed)
   Subjective:    Patient ID: Susan Mcneil, female    DOB: 02/09/1946, 78 y.o.   MRN: [id number]  HPI Very nice patient Also very nice son in husband Here today for follow-up regarding Alzheimer's Family feels things are hanging in there she is on her thyroid  medicine but nothing else she does try to eat on a regular basis does better when she is eating with other individuals does better when family fixes food for her husband is trying hard He does have a helper that comes a couple times a week which helps Patient is able to ambulate she did have 1 fall in the bathroom with no injuries She is not able to really relate her thoughts or concerns occasionally she does say sentences that make sense but otherwise does not They use pull-ups all the time   Review of Systems     Objective:   Physical Exam  General-in no acute distress Eyes-no discharge Lungs-respiratory rate normal, CTA CV-no murmurs,RRR Extremities skin warm dry no edema Neuro grossly normal Behavior normal, alert Patient is moving about the exam room Not really able to answer direct questions Very pleasant      Assessment & Plan:  Severe Alzheimer's Additional supplements or medicines would not be of help Under palliative hospice care  Hypothyroidism continue medication refill sent in  Follow-up again in 6 months follow-up sooner if any problems  I recommended flu shot today the son had concerns at he will ensure that she would be able to tolerate the side effects and concerned that it could cause her to get sick discussed thoroughly the benefits and risk of flu shots I tried to be as respectful as possible and gave several examples of the population benefit of getting a flu vaccine in regards to reducing the risk of hospitalization and death but also indicating to them that as their physician I recommend the shot but by no means am I dictating that they get the shot-ultimately it comes to the husband who is the  direct power of attorney-he relates that he would like for her to get the vaccine all questions were answered

## 2024-03-20 NOTE — Addendum Note (Signed)
 Addended by: ANGELENA RONAL BRADLEY K on: 03/20/2024 02:08 PM   Modules accepted: Orders

## 2024-03-20 NOTE — Telephone Encounter (Signed)
 Spoke with family today her behavior overall looks good we will not add any additional medicines currently

## 2024-06-07 ENCOUNTER — Telehealth: Payer: Self-pay

## 2024-06-07 NOTE — Telephone Encounter (Signed)
 Copied from CRM #[id number]. Topic: General - Other >> Jun 07, 2024  1:49 PM Antwanette L wrote: Reason for CRM: Elveria from [facility] is calling  to inform Dr. Alphonsa that the pt was able to receive a nursing visit . Elveria can be reached at [phone]

## 2024-09-19 ENCOUNTER — Ambulatory Visit: Admitting: Family Medicine
